# Patient Record
Sex: Male | Born: 1960 | Race: White | Hispanic: No | Marital: Married | Smoking: Never smoker
Health system: Southern US, Community
[De-identification: ages and names within clinical notes are randomized; demographics above are authoritative.]

## PROBLEM LIST (undated history)

## (undated) DIAGNOSIS — T7840XA Allergy, unspecified, initial encounter: Secondary | ICD-10-CM

## (undated) DIAGNOSIS — K5900 Constipation, unspecified: Secondary | ICD-10-CM

## (undated) DIAGNOSIS — E669 Obesity, unspecified: Secondary | ICD-10-CM

## (undated) DIAGNOSIS — K602 Anal fissure, unspecified: Secondary | ICD-10-CM

## (undated) DIAGNOSIS — Z8619 Personal history of other infectious and parasitic diseases: Secondary | ICD-10-CM

## (undated) DIAGNOSIS — K219 Gastro-esophageal reflux disease without esophagitis: Secondary | ICD-10-CM

## (undated) HISTORY — DX: Obesity, unspecified: E66.9

## (undated) HISTORY — DX: Allergy, unspecified, initial encounter: T78.40XA

## (undated) HISTORY — DX: Personal history of other infectious and parasitic diseases: Z86.19

## (undated) HISTORY — DX: Gastro-esophageal reflux disease without esophagitis: K21.9

## (undated) HISTORY — DX: Constipation, unspecified: K59.00

## (undated) HISTORY — DX: Anal fissure, unspecified: K60.2

---

## 2007-01-27 ENCOUNTER — Ambulatory Visit: Payer: Self-pay | Admitting: Gastroenterology

## 2009-03-29 ENCOUNTER — Observation Stay: Payer: Self-pay | Admitting: Specialist

## 2009-05-10 DEATH — deceased

## 2015-04-30 ENCOUNTER — Ambulatory Visit (HOSPITAL_COMMUNITY)
Admission: RE | Admit: 2015-04-30 | Discharge: 2015-04-30 | Disposition: A | Discharge status: Home / Self Care | Payer: BC Managed Care – PPO | Source: Ambulatory Visit | Attending: Cardiovascular Disease | Admitting: Cardiovascular Disease

## 2015-04-30 ENCOUNTER — Other Ambulatory Visit: Payer: Self-pay | Admitting: Sports Medicine

## 2015-04-30 DIAGNOSIS — M79604 Pain in right leg: Secondary | ICD-10-CM | POA: Insufficient documentation

## 2015-04-30 DIAGNOSIS — M7989 Other specified soft tissue disorders: Secondary | ICD-10-CM

## 2015-04-30 DIAGNOSIS — M25561 Pain in right knee: Secondary | ICD-10-CM | POA: Insufficient documentation

## 2016-11-16 ENCOUNTER — Encounter: Payer: Self-pay | Admitting: Nurse Practitioner

## 2016-11-16 NOTE — Progress Notes (Signed)
.  pcn

## 2016-11-18 ENCOUNTER — Ambulatory Visit (INDEPENDENT_AMBULATORY_CARE_PROVIDER_SITE_OTHER): Payer: Self-pay | Admitting: Nurse Practitioner

## 2016-11-18 ENCOUNTER — Encounter: Payer: Self-pay | Admitting: Nurse Practitioner

## 2016-11-18 VITALS — BP 140/88 | HR 78 | Temp 98.0°F | Resp 16 | Ht 74.0 in | Wt 279.0 lb

## 2016-11-18 DIAGNOSIS — M546 Pain in thoracic spine: Secondary | ICD-10-CM

## 2016-11-18 DIAGNOSIS — Z7689 Persons encountering health services in other specified circumstances: Secondary | ICD-10-CM | POA: Diagnosis not present

## 2016-11-18 LAB — POCT URINALYSIS DIPSTICK
Bilirubin, UA: NEGATIVE
Glucose, UA: NEGATIVE
Ketones, UA: NEGATIVE
Leukocytes, UA: NEGATIVE — AB
Nitrite, UA: NEGATIVE
Spec Grav, UA: 1.025 (ref 1.010–1.025)
Urobilinogen, UA: 0.2 E.U./dL
pH, UA: 6 (ref 5.0–8.0)

## 2016-11-18 NOTE — Progress Notes (Signed)
Subjective:    Patient ID: Randy Watson, male    DOB: 19-Apr-1961, 56 y.o.   MRN: [id number]  Randy Watson is a 56 y.o. male presenting on 11/18/2016 for Back Pain (more on Right side top onset month got worst last 3-4 days)   HPI Lower Thoracic Back Pain Back pain started 1 month ago and felt like a muscle strain and has gradually worsened over last month.  Pt is active as Journalist, newspaper at [employer] and may have injured himself, but doesn't recall any injury.  He reports significant worsening over the last 3-4 days that is preventing sleep. Pain is now described as sharp pain, sometimes radiates to lower back.  It is mostly localized to lower thoracic spine and upper lumbar region. It is also making cleaning himself after using the bathroom difficult.  Most painful movements are twisting, breathing.  He also notices pain when at rest.  Movement and exercises like mowing and weedeating for athletic fields are the extent of his activity over the last month.  The patient does not notice tenderness to touch.  Denies loss of control of bowel or bladder.  He has been taking miralax qod to help with straining r/t anal fissure.  He notes it is helping him ease bowel movement strain with back pain also.  For pain he has used ibuproven,aleeve motrin, tylenol, muscle rub with lidocaine, heat and ice with minimal relief.  Heat more effective than ice.  He reports utilizing chiropractic care 1x month, but not since pain started.    Associated symptoms: urinary frequency.   Significant to patient is his family hx kidney stone and kidney cancer.  He is interested in making sure these conditions are not likely for his current symptoms.  Family History  Problem Relation Age of Onset  . Cancer Father     renal/pancreatic  . CAD Father    Past Medical History:  Diagnosis Date  . Allergy   . Anal fissure   . Constipation   . GERD (gastroesophageal reflux disease)   . History of chicken pox    . Obesity     Social History  Substance Use Topics  . Smoking status: Never Smoker  . Smokeless tobacco: Never Used  . Alcohol use Yes     Comment: occasionally    Review of Systems  All other systems reviewed and are negative.  Per HPI unless specifically indicated above     Objective:    BP 140/88   Pulse 78   Temp 98 F (36.7 C) (Oral)   Resp 16   Ht 6\' 2"  (1.88 m)   Wt 279 lb (126.6 kg)   BMI 35.82 kg/m   Wt Readings from Last 3 Encounters:  11/18/16 279 lb (126.6 kg)    Physical Exam  Constitutional: He appears well-developed and well-nourished.  Consistently changing positions to relieve pain  HENT:  Head: Normocephalic and atraumatic.  Neck: Normal range of motion. Neck supple.  Pulmonary/Chest:  CVA tenderness Left  Musculoskeletal:       Cervical back: He exhibits no tenderness.       Thoracic back: He exhibits decreased range of motion and pain. He exhibits no tenderness and no bony tenderness.       Lumbar back: He exhibits no tenderness.       Back:  Decr ROM lateral bend to R and L.  Forward bend FROM but elicits pain.       Assessment & Plan:  Problem List Items Addressed This Visit    None    Visit Diagnoses    Encounter to establish care with new doctor    -  Primary   Acute right-sided thoracic back pain     Worsening severity, subacute.  Cause unknown - possible musculoskeletal cause vs. Other referred pain as evidenced by positive CVA tenderness.  1. Control pain with NSAIDS, acetaminophen, topical agents, heat and ice. - NSAIDS may raise BP.  Educated patient to use acetaminophen first, then ibuprofen if needed.  Avoid multiple NSAIDS in same day. 2. UA with culture. - Urinalysis positive for trace blood and protein.   - Proceed with culture.  Evaluate labs at next appointment for DM, HTN that may explain blood and protein in UA. 3. Consider abdominal ultrasound or abdominal and pelvic CT scan or MRI to evaluate spine and kidney,  ureters, bladder, urethra and referral to specialist if indicated by imaging. - Family history of renal stones and renal cancer significant.  Other risk factors for renal cell carcinoma are present (obesity, prior high BP readings in clinic with possible undiagnosed HTN, male gender).   Relevant Orders   Urine culture   POCT urinalysis dipstick (Completed)      Follow up plan: Return 1-2 weeks if symptoms worsen or fail to improve.  Cassell Smiles, DNP, AGPCNP-BC Adult Gerontology Primary Care Nurse Practitioner [facility] 11/18/2016, 10:32 PM

## 2016-11-18 NOTE — Patient Instructions (Signed)
Randy Watson, Thank you for coming in to clinic today.  1. - Start taking acetaminophen Tylenol extra strength 1 to 2 tablets every 6-8 hours for aches or fever/chills for next few days as needed.  Do not take more than 3,000 mg in 24 hours from all medicines.  May take Ibuprofen as well if tolerated 400-600 mg every 6-8 hours as needed.  Alternate between the two medicines. - Use heat and ice.  Apply this for 15 minutes at a time 6-8 times per day.   - Muscle rub with lidocaine.  Avoid using this with heat and ice to avoid burns.  2. We will do a urinalysis and culture if needed today.  If nothing, we will do an abdominal ultrasound of the Kidney.  3. If no answers or resolving symptoms after 1-2 weeks, we can do an X-ray and/ or MRI.  Please schedule a follow-up appointment with Cassell Smiles, AGNP in 1-2 weeks if not improving.  If you have any other questions or concerns, please feel free to call the clinic or send a message through [facility]. You may also schedule an earlier appointment if necessary.  Cassell Smiles, DNP, AGNP-BC Adult Gerontology Nurse Practitioner [facility]   Mid-Back Strain Rehab Ask your health care provider which exercises are safe for you. Do exercises exactly as told by your health care provider and adjust them as directed. It is normal to feel mild stretching, pulling, tightness, or discomfort as you do these exercises, but you should stop right away if you feel sudden pain or your pain gets worse. Do not begin these exercises until told by your health care provider. Stretching and range of motion exercises This exercise warms up your muscles and joints and improves the movement and flexibility of your back and shoulders. This exercise also help to relieve pain. Exercise A: Chest and spine stretch   1. Lie down on your back on a firm surface. 2. Roll a towel or a small blanket so it is about 4 inches (10 cm) in diameter. 3. Put the towel  lengthwise under the middle of your back so it is under your spine, but not under your shoulder blades. 4. To increase the stretch, you may put your hands behind your head and let your elbows fall to your sides. 5. Hold for __________ seconds. Repeat exercise __________ times. Complete this exercise __________ times a day. Strengthening exercises These exercises build strength and endurance in your back and your shoulder blade muscles. Endurance is the ability to use your muscles for a long time, even after they get tired. Exercise B: Alternating arm and leg raises   1. Get on your hands and knees on a firm surface. If you are on a hard floor, you may want to use padding to cushion your knees, such as an exercise mat. 2. Line up your arms and legs. Your hands should be below your shoulders, and your knees should be below your hips. 3. Lift your left leg behind you. At the same time, raise your right arm and straighten it in front of you.  Do not lift your leg higher than your hip.  Do not lift your arm higher than your shoulder.  Keep your abdominal and back muscles tight.  Keep your hips facing the ground.  Do not arch your back.  Keep your balance carefully, and do not hold your breath. 4. Hold for __________ seconds. 5. Slowly return to the starting position and repeat with  your right leg and your left arm. Repeat __________ times. Complete this exercise __________ times a day. Exercise C: Straight arm rows (  shoulder extension) 1. Stand with your feet shoulder width apart. 2. Secure an exercise band to a stable object in front of you so the band is at or above shoulder height. 3. Hold one end of the exercise band in each hand. 4. Straighten your elbows and lift your hands up to shoulder height. 5. Step back, away from the secured end of the exercise band, until the band stretches. 6. Squeeze your shoulder blades together and pull your hands down to the sides of your thighs. Stop  when your hands are straight down by your sides. Do not let your hands go behind your body. 7. Hold for __________ seconds. 8. Slowly return to the starting position. Repeat __________ times. Complete this exercise __________ times a day. Exercise D: Shoulder external rotation, prone 1. Lie on your abdomen on a firm bed so your left / right forearm hangs over the edge of the bed and your upper arm is on the bed, straight out from your body.  Your elbow should be bent.  Your palm should be facing your feet. 2. If instructed, hold a __________ weight in your hand. 3. Squeeze your shoulder blade toward the middle of your back. Do not let your shoulder lift toward your ear. 4. Keep your elbow bent in an "L" shape (90 degrees) while you slowly move your forearm up toward the ceiling. Move your forearm up to the height of the bed, toward your head.  Your upper arm should not move.  At the top of the movement, your palm should face the floor. 5. Hold for __________ seconds. 6. Slowly return to the starting position and relax your muscles. Repeat __________ times. Complete this exercise __________ times a day. Exercise E: Scapular retraction and external rotation, rowing  1. Sit in a stable chair without armrests, or stand. 2. Secure an exercise band to a stable object in front of you so it is at shoulder height. 3. Hold one end of the exercise band in each hand. 4. Bring your arms out straight in front of you. 5. Step back, away from the secured end of the exercise band, until the band stretches. 6. Pull the band backward. As you do this, bend your elbows and squeeze your shoulder blades together, but avoid letting the rest of your body move. Do not let your shoulders lift up toward your ears. 7. Stop when your elbows are at your sides or slightly behind your body. 8. Hold for __________ seconds. 9. Slowly straighten your arms to return to the starting position. Repeat __________ times.  Complete this exercise __________ times a day. Posture and body mechanics  Body mechanics refers to the movements and positions of your body while you do your daily activities. Posture is part of body mechanics. Good posture and healthy body mechanics can help to relieve stress in your body's tissues and joints. Good posture means that your spine is in its natural S-curve position (your spine is neutral), your shoulders are pulled back slightly, and your head is not tipped forward. The following are general guidelines for applying improved posture and body mechanics to your everyday activities. Standing   When standing, keep your spine neutral and your feet about hip-width apart. Keep a slight bend in your knees. Your ears, shoulders, and hips should line up.  When you do a task in which  you lean forward while standing in one place for a long time, place one foot up on a stable object that is 2-4 inches (5-10 cm) high, such as a footstool. This helps keep your spine neutral. Sitting   When sitting, keep your spine neutral and keep your feet flat on the floor. Use a footrest, if necessary, and keep your thighs parallel to the floor. Avoid rounding your shoulders, and avoid tilting your head forward.  When working at a desk or a computer, keep your desk at a height where your hands are slightly lower than your elbows. Slide your chair under your desk so you are close enough to maintain good posture.  When working at a computer, place your monitor at a height where you are looking straight ahead and you do not have to tilt your head forward or downward to look at the screen. Resting  When lying down and resting, avoid positions that are most painful for you.  If you have pain with activities such as sitting, bending, stooping, or squatting (flexion-based activities), lie in a position in which your body does not bend very much. For example, avoid curling up on your side with your arms and knees  near your chest (fetal position).  If you have pain with activities such as standing for a long time or reaching with your arms (extension-based activities), lie with your spine in a neutral position and bend your knees slightly. Try the following positions:  Lying on your side with a pillow between your knees.  Lying on your back with a pillow under your knees. Lifting   When lifting objects, keep your feet at least shoulder-width apart and tighten your abdominal muscles.  Bend your knees and hips and keep your spine neutral. It is important to lift using the strength of your legs, not your back. Do not lock your knees straight out.  Always ask for help to lift heavy or awkward objects. This information is not intended to replace advice given to you by your health care provider. Make sure you discuss any questions you have with your health care provider. Document Released: 07/27/2005 Document Revised: 04/02/2016 Document Reviewed: 05/08/2015 Elsevier Interactive Patient Education  2017 [employer].

## 2016-11-19 NOTE — Progress Notes (Signed)
I have reviewed this encounter including the documentation in this note and/or discussed this patient with the provider, Cassell Smiles, AGPCNP-BC. I am certifying that I agree with the content of this note as supervising physician.  [name], [facility] 11/19/2016, 8:13 AM

## 2016-11-20 LAB — URINE CULTURE: Organism ID, Bacteria: NO GROWTH

## 2016-11-23 ENCOUNTER — Other Ambulatory Visit: Payer: Self-pay | Admitting: Nurse Practitioner

## 2016-11-23 DIAGNOSIS — R808 Other proteinuria: Secondary | ICD-10-CM

## 2016-11-23 DIAGNOSIS — R3129 Other microscopic hematuria: Secondary | ICD-10-CM

## 2016-11-25 LAB — COMPLETE METABOLIC PANEL WITH GFR
ALT: 22 U/L (ref 9–46)
AST: 21 U/L (ref 10–35)
Albumin: 4.2 g/dL (ref 3.6–5.1)
Alkaline Phosphatase: 86 U/L (ref 40–115)
BUN: 13 mg/dL (ref 7–25)
CO2: 23 mmol/L (ref 20–31)
Calcium: 9.4 mg/dL (ref 8.6–10.3)
Chloride: 104 mmol/L (ref 98–110)
Creat: 0.95 mg/dL (ref 0.70–1.33)
GFR, Est African American: >89 mL/min (ref >=60)
GFR, Est Non African American: 89 mL/min (ref >=60)
Glucose, Bld: 120 mg/dL — ABNORMAL HIGH (ref 65–99)
Potassium: 4.2 mmol/L (ref 3.5–5.3)
Sodium: 138 mmol/L (ref 135–146)
Total Bilirubin: 0.6 mg/dL (ref 0.2–1.2)
Total Protein: 7.1 g/dL (ref 6.1–8.1)

## 2016-11-26 LAB — HEMOGLOBIN A1C
Hgb A1c MFr Bld: 5.7 % — ABNORMAL HIGH (ref <5.7)
Mean Plasma Glucose: 117 mg/dL

## 2016-12-14 ENCOUNTER — Encounter: Payer: Self-pay | Admitting: Nurse Practitioner

## 2016-12-15 ENCOUNTER — Other Ambulatory Visit: Payer: Self-pay | Admitting: Nurse Practitioner

## 2016-12-15 DIAGNOSIS — R109 Unspecified abdominal pain: Secondary | ICD-10-CM

## 2017-01-19 ENCOUNTER — Ambulatory Visit
Admission: RE | Admit: 2017-01-19 | Discharge: 2017-01-19 | Disposition: A | Discharge status: Home / Self Care | Payer: BC Managed Care – PPO | Source: Ambulatory Visit | Attending: Urology | Admitting: Urology

## 2017-01-19 ENCOUNTER — Ambulatory Visit (INDEPENDENT_AMBULATORY_CARE_PROVIDER_SITE_OTHER): Payer: BC Managed Care – PPO | Admitting: Urology

## 2017-01-19 ENCOUNTER — Encounter: Payer: Self-pay | Admitting: Urology

## 2017-01-19 VITALS — BP 164/92 | HR 66 | Ht 74.0 in | Wt 277.2 lb

## 2017-01-19 DIAGNOSIS — R109 Unspecified abdominal pain: Secondary | ICD-10-CM | POA: Diagnosis not present

## 2017-01-19 DIAGNOSIS — Z125 Encounter for screening for malignant neoplasm of prostate: Secondary | ICD-10-CM | POA: Diagnosis not present

## 2017-01-19 LAB — URINALYSIS, COMPLETE
Bilirubin, UA: NEGATIVE
Glucose, UA: NEGATIVE
Ketones, UA: NEGATIVE
Leukocytes, UA: NEGATIVE
Nitrite, UA: NEGATIVE
Protein, UA: NEGATIVE
RBC, UA: NEGATIVE
Specific Gravity, UA: 1.02 (ref 1.005–1.030)
Urobilinogen, Ur: 0.2 mg/dL (ref 0.2–1.0)
pH, UA: 6.5 (ref 5.0–7.5)

## 2017-01-19 NOTE — Progress Notes (Signed)
01/19/2017 10:54 AM   Randy Watson Jun 19, 1961 [id number]  Referring provider: Arlis Porta., MD No address on file  Chief Complaint  Patient presents with  . New Patient (Initial Visit)    R  flank pain referred by Mattie Marlin    HPI: Consultation for right flank pain. For about 2 months patient has had right upper back pain which sometimes radiates to front. It's worse when he twists or bends. He went to a Art therapist. NSAIDs help. Discomfort stable. Mild to moderate in intensity - "bearable". UA showed trace blood at PCP. No microscopic exam was done. BUN 13, creatinine 0.95. He had no imaging done. He has a family history of kidney stones and kidney cancer, therefore was referred to urology. His urine is clear. No dysuria or gross hematuria. AUA symptom score is 3, quality of life 2.   UA today shows no red blood cells, no white blood cells, no bacteria. Bland.    PMH: Past Medical History:  Diagnosis Date  . Allergy   . Anal fissure   . Constipation   . GERD (gastroesophageal reflux disease)   . History of chicken pox   . Obesity     Surgical History: No past surgical history on file.  Home Medications:  Allergies as of 01/19/2017      Reactions   Penicillins       Medication List       Accurate as of 01/19/17 10:54 AM. Always use your most recent med list.          ibuprofen 200 MG tablet Commonly known as:  ADVIL,MOTRIN Take 200 mg by mouth every 6 (six) hours as needed.   polyethylene glycol powder powder Commonly known as:  GLYCOLAX/MIRALAX Take 1 Container by mouth once.       Allergies:  Allergies  Allergen Reactions  . Penicillins     Family History: Family History  Problem Relation Age of Onset  . Cancer Father        renal/pancreatic  . CAD Father   . Prostate cancer Maternal Grandfather   . Bladder Cancer Neg Hx     Social History:  reports that he has never smoked. His smokeless tobacco use includes Chew. He  reports that he drinks alcohol. He reports that he does not use drugs.  ROS: UROLOGY Frequent Urination?: No Hard to postpone urination?: No Burning/pain with urination?: No Get up at night to urinate?: Yes Leakage of urine?: No Urine stream starts and stops?: No Trouble starting stream?: No Do you have to strain to urinate?: No Blood in urine?: Yes Urinary tract infection?: No Sexually transmitted disease?: No Injury to kidneys or bladder?: No Painful intercourse?: No Weak stream?: No Erection problems?: No Penile pain?: No  Gastrointestinal Nausea?: No Vomiting?: No Indigestion/heartburn?: No Diarrhea?: No Constipation?: No  Constitutional Fever: No Night sweats?: No Weight loss?: No Fatigue?: No  Skin Skin rash/lesions?: Yes Itching?: Yes  Eyes Blurred vision?: No Double vision?: No  Ears/Nose/Throat Sore throat?: No Sinus problems?: No  Hematologic/Lymphatic Swollen glands?: No Easy bruising?: No  Cardiovascular Leg swelling?: No Chest pain?: No  Respiratory Cough?: No Shortness of breath?: No  Endocrine Excessive thirst?: No  Musculoskeletal Back pain?: Yes Joint pain?: Yes  Neurological Headaches?: No Dizziness?: No  Psychologic Depression?: No Anxiety?: No  Physical Exam: BP (!) 164/92   Pulse 66   Ht 6\' 2"  (1.88 m)   Wt 125.7 kg (277 lb 3.2 oz)   BMI 35.59  kg/m   Constitutional:  Alert and oriented, No acute distress. HEENT: [place] AT, moist mucus membranes.  Trachea midline, no masses. Cardiovascular: No clubbing, cyanosis, or edema. Respiratory: Normal respiratory effort, no increased work of breathing. GI: Abdomen is soft, nontender, nondistended, no abdominal masses GU: No CVA tenderness.  Penis - circumcised and normal Testicles - descended bilaterally and palpably normal DRE - prostate about 30 g , smooth , no hard area of nodule   Skin: No rashes, bruises or suspicious lesions. Lymph: No cervical or inguinal  adenopathy. Neurologic: Grossly intact, no focal deficits, moving all 4 extremities. Psychiatric: Normal mood and affect.  Laboratory Data: No results found for: WBC, HGB, HCT, MCV, PLT  Lab Results  Component Value Date   CREATININE 0.95 11/23/2016    No results found for: PSA  No results found for: TESTOSTERONE  Lab Results  Component Value Date   HGBA1C 5.7 (H) 11/23/2016    Urinalysis    Component Value Date/Time   BILIRUBINUR negative 11/18/2016 1149   PROTEINUR trace 11/18/2016 1149   UROBILINOGEN 0.2 11/18/2016 1149   NITRITE negative 11/18/2016 1149   LEUKOCYTESUR Negative (A) 11/18/2016 1149    Assessment & Plan:   1. Left flank pain - Unclear etiology. Normal exam and urine clear. Discussed with patient the differential diagnosis. Discussed urinary tract screening and the nature risk benefits positive and negative predictive values of KUB, renal ultrasound and CT scan with and without contrast. All questions answered. He elected to go with a KUB and a renal ultrasound.  2. PCa screening - DRE felt benign. Discussed the nature risk and benefits of PSA screening and he elected to check a PSA.  - Urinalysis, Complete   No Follow-up on file.  Festus Aloe, [facility] [address], [place], [place] [address] [phone]

## 2017-01-20 LAB — PSA: Prostate Specific Ag, Serum: 0.6 ng/mL (ref 0.0–4.0)

## 2017-01-25 ENCOUNTER — Ambulatory Visit
Admission: RE | Admit: 2017-01-25 | Discharge: 2017-01-25 | Disposition: A | Discharge status: Home / Self Care | Payer: BC Managed Care – PPO | Source: Ambulatory Visit | Attending: Urology | Admitting: Urology

## 2017-01-25 DIAGNOSIS — R109 Unspecified abdominal pain: Secondary | ICD-10-CM

## 2017-01-26 ENCOUNTER — Telehealth: Payer: Self-pay

## 2017-01-26 NOTE — Telephone Encounter (Signed)
Patient notified of results on vmail

## 2017-01-26 NOTE — Telephone Encounter (Signed)
-----   Message from Festus Aloe, MD sent at 01/26/2017 10:38 AM EDT ----- Notify patient his PSA is very low, very normal.    ----- Message ----- From: Royanne Foots, CMA Sent: 01/20/2017   8:34 AM To: Festus Aloe, MD    ----- Message ----- From: Interface, [facility] Lab Results In Sent: 01/19/2017   4:41 PM To: Rowe Robert Clinical

## 2017-01-26 NOTE — Telephone Encounter (Signed)
-----   Message from Festus Aloe, MD sent at 01/25/2017 10:28 PM EDT ----- Notify patient his KUB was also normal - no stone identified   ----- Message ----- From: Royanne Foots, CMA Sent: 01/19/2017   3:51 PM To: Festus Aloe, MD    ----- Message ----- From: Interface, Rad Results In Sent: 01/19/2017   3:12 PM To: Rowe Robert Clinical

## 2017-01-26 NOTE — Telephone Encounter (Signed)
-----   Message from Festus Aloe, MD sent at 01/25/2017 10:22 PM EDT ----- Notify patient his renal US was normal (no mass, stone or blockage was noted)- I recommend he f/u with his PCP for continued evaluation of pain  ----- Message ----- From: Royanne Foots, CMA Sent: 01/25/2017   4:43 PM To: Festus Aloe, MD    ----- Message ----- From: Interface, Rad Results In Sent: 01/25/2017   4:35 PM To: Rowe Robert Clinical

## 2017-01-26 NOTE — Telephone Encounter (Signed)
Left pt mess to call 

## 2018-10-13 ENCOUNTER — Encounter: Payer: Self-pay | Admitting: Emergency Medicine

## 2018-10-13 ENCOUNTER — Emergency Department: Payer: BC Managed Care – PPO

## 2018-10-13 ENCOUNTER — Other Ambulatory Visit: Payer: Self-pay

## 2018-10-13 ENCOUNTER — Emergency Department
Admission: EM | Admit: 2018-10-13 | Discharge: 2018-10-14 | Disposition: A | Discharge status: Home / Self Care | Payer: BC Managed Care – PPO | Attending: Emergency Medicine | Admitting: Emergency Medicine

## 2018-10-13 DIAGNOSIS — F17220 Nicotine dependence, chewing tobacco, uncomplicated: Secondary | ICD-10-CM | POA: Insufficient documentation

## 2018-10-13 DIAGNOSIS — Z79899 Other long term (current) drug therapy: Secondary | ICD-10-CM | POA: Insufficient documentation

## 2018-10-13 DIAGNOSIS — R079 Chest pain, unspecified: Secondary | ICD-10-CM | POA: Diagnosis present

## 2018-10-13 LAB — BASIC METABOLIC PANEL
Anion gap: 7 (ref 5–15)
BUN: 15 mg/dL (ref 6–20)
CO2: 27 mmol/L (ref 22–32)
Calcium: 8.9 mg/dL (ref 8.9–10.3)
Chloride: 105 mmol/L (ref 98–111)
Creatinine, Ser: 0.91 mg/dL (ref 0.61–1.24)
GFR calc Af Amer: >60 mL/min (ref >60)
GFR calc non Af Amer: >60 mL/min (ref >60)
Glucose, Bld: 94 mg/dL (ref 70–99)
Potassium: 3.8 mmol/L (ref 3.5–5.1)
Sodium: 139 mmol/L (ref 135–145)

## 2018-10-13 LAB — CBC
HCT: 44.5 % (ref 39.0–52.0)
Hemoglobin: 14.8 g/dL (ref 13.0–17.0)
MCH: 30.7 pg (ref 26.0–34.0)
MCHC: 33.3 g/dL (ref 30.0–36.0)
MCV: 92.3 fL (ref 80.0–100.0)
Platelets: 330 10*3/uL (ref 150–400)
RBC: 4.82 MIL/uL (ref 4.22–5.81)
RDW: 12.1 % (ref 11.5–15.5)
WBC: 9.5 10*3/uL (ref 4.0–10.5)
nRBC: 0 % (ref 0.0–0.2)

## 2018-10-13 LAB — TROPONIN I
Troponin I: <0.03 ng/mL (ref <0.03)
Troponin I: <0.03 ng/mL (ref <0.03)

## 2018-10-13 NOTE — ED Notes (Signed)
Dr. Brown at bedside

## 2018-10-13 NOTE — ED Triage Notes (Signed)
Pt arrives with complaints of left sided chest pain that started last night. Pt reports the pain has been intermittent. Pt states the pain feels like a pressure.

## 2018-10-13 NOTE — ED Provider Notes (Signed)
[facility] Emergency Department Provider Note    First MD Initiated Contact with Patient 10/13/18 2302     (approximate)  I have reviewed the triage vital signs and the nursing notes.   HISTORY  Chief Complaint Chest Pain   HPI Randy Watson is a 58 y.o. presents to the emergency department intermittent left-sided chest pain which began yesterday.  Patient denies any dyspnea no lower extremity pain or swelling.  Patient denies any abdominal discomfort.  Patient denies any dizziness or palpitations.  Patient does admit to familial history of CAD father receiving stents.  Patient denies any pain at present.    Past Medical History:  Diagnosis Date  . Allergy   . Anal fissure   . Constipation   . GERD (gastroesophageal reflux disease)   . History of chicken pox   . Obesity     There are no active problems to display for this patient.   History reviewed. No pertinent surgical history.  Prior to Admission medications   Medication Sig Start Date End Date Taking? Authorizing Provider  ibuprofen (ADVIL,MOTRIN) 200 MG tablet Take 200 mg by mouth every 6 (six) hours as needed.    [provider]  polyethylene glycol powder (GLYCOLAX/MIRALAX) powder Take 1 Container by mouth once.    [provider]    Allergies Penicillins  Family History  Problem Relation Age of Onset  . Cancer Father        renal/pancreatic  . CAD Father   . Prostate cancer Maternal Grandfather   . Bladder Cancer Neg Hx     Social History Social History   Tobacco Use  . Smoking status: Never Smoker  . Smokeless tobacco: Current User    Types: Chew  . Tobacco comment: dip  Substance Use Topics  . Alcohol use: Yes    Comment: occasionally  . Drug use: No    Review of Systems Constitutional: No fever/chills Eyes: No visual changes. ENT: No sore throat. Cardiovascular: Positive for chest pain. Respiratory: Denies shortness of  breath. Gastrointestinal: No abdominal pain.  No nausea, no vomiting.  No diarrhea.  No constipation. Genitourinary: Negative for dysuria. Musculoskeletal: Negative for neck pain.  Negative for back pain. Integumentary: Negative for rash. Neurological: Negative for headaches, focal weakness or numbness.   ____________________________________________   PHYSICAL EXAM:  VITAL SIGNS: ED Triage Vitals  Enc Vitals Group     BP 10/13/18 1904 (!) 147/92     Pulse Rate 10/13/18 1903 81     Resp 10/13/18 1903 18     Temp 10/13/18 1903 98.2 F (36.8 C)     Temp Source 10/13/18 1903 Oral     SpO2 10/13/18 1903 98 %     Weight 10/13/18 1904 119.3 kg (263 lb)     Height 10/13/18 1904 1.88 m (6\' 2" )     Head Circumference --      Peak Flow --      Pain Score 10/13/18 1903 5     Pain Loc --      Pain Edu? --      Excl. in [place]? --     Constitutional: Alert and oriented. Well appearing and in no acute distress. Eyes: Conjunctivae are normal.  Head: Atraumatic. Mouth/Throat: Mucous membranes are moist. Oropharynx non-erythematous. Neck: No stridor. Cardiovascular: Normal rate, regular rhythm. Good peripheral circulation. Grossly normal heart sounds. Respiratory: Normal respiratory effort.  No retractions. Lungs CTAB. Gastrointestinal: Soft and nontender. No distention.  Musculoskeletal: No lower  extremity tenderness nor edema. No gross deformities of extremities. Neurologic:  Normal speech and language. No gross focal neurologic deficits are appreciated.  Skin:  Skin is warm, dry and intact. No rash noted. Psychiatric: Mood and affect are normal. Speech and behavior are normal.  ____________________________________________   LABS (all labs ordered are listed, but only abnormal results are displayed)  Labs Reviewed  BASIC METABOLIC PANEL  CBC  TROPONIN I  TROPONIN I   ____________________________________________  EKG  ED ECG REPORT I, [place] N Nathifa Ritthaler, the attending  physician, personally viewed and interpreted this ECG.   Date: 10/13/2018  EKG Time: 7:03 PM  Rate: 79  Rhythm: Normal sinus rhythm  Axis: Normal  Intervals: Normal  ST&T Change: None  ____________________________________________  RADIOLOGY I, Helen N Kayveon Lennartz, personally viewed and evaluated these images (plain radiographs) as part of my medical decision making, as well as reviewing the written report by the radiologist.  ED MD interpretation: No active cardiopulmonary disease noted on chest x-ray per radiologist.  Official radiology report(s): Dg Chest 2 View  Result Date: 10/13/2018 CLINICAL DATA:  Chest pain EXAM: CHEST - 2 VIEW COMPARISON:  None. FINDINGS: The heart size and mediastinal contours are within normal limits. Both lungs are clear. Mild degenerative changes of the spine. Mild atelectasis at the left base. IMPRESSION: No active cardiopulmonary disease. Electronically Signed   By: Donavan Foil M.D.   On: 10/13/2018 19:21     Procedures   ____________________________________________   INITIAL IMPRESSION / MDM / [place] / ED COURSE  As part of my medical decision making, I reviewed the following data within the electronic MEDICAL RECORD NUMBER 58 year old male presenting with above-stated history and physical exam secondary to chest pain however none at present.  Concern for possible CAD including MI/angina however EKG revealed no evidence of ischemia or infarction.  Laboratory data including troponin x2- additional laboratory data also unremarkable.  Chest x-ray also negative.  Spoke with the patient at length regarding necessity of following up with Dr. Saralyn Pilar cardiologist for further outpatient evaluation. ____________________________________________  FINAL CLINICAL IMPRESSION(S) / ED DIAGNOSES  Final diagnoses:  Chest pain, unspecified type     MEDICATIONS GIVEN DURING THIS VISIT:  Medications - No data to display   ED Discharge Orders     None       Note:  This document was prepared using Dragon voice recognition software and may include unintentional dictation errors.   Gregor Hams, MD 10/14/18 [phone]

## 2018-10-13 NOTE — ED Notes (Addendum)
Pt refuses lab draw and demands to speak to MD. MD made aware

## 2018-10-14 NOTE — ED Notes (Signed)
Patient discharged to home per MD order. Patient in stable condition, and deemed medically cleared by ED provider for discharge. Discharge instructions reviewed with patient/family using "Teach Back"; verbalized understanding of medication education and administration, and information about follow-up care. Denies further concerns. ° °

## 2018-11-30 DIAGNOSIS — E782 Mixed hyperlipidemia: Secondary | ICD-10-CM | POA: Insufficient documentation

## 2018-11-30 DIAGNOSIS — I10 Essential (primary) hypertension: Secondary | ICD-10-CM | POA: Insufficient documentation

## 2018-12-09 DIAGNOSIS — I639 Cerebral infarction, unspecified: Secondary | ICD-10-CM

## 2018-12-09 HISTORY — DX: Cerebral infarction, unspecified: I63.9

## 2018-12-19 ENCOUNTER — Other Ambulatory Visit: Payer: Self-pay

## 2018-12-19 ENCOUNTER — Ambulatory Visit (INDEPENDENT_AMBULATORY_CARE_PROVIDER_SITE_OTHER): Payer: BC Managed Care – PPO | Admitting: Family Medicine

## 2018-12-19 ENCOUNTER — Encounter: Payer: Self-pay | Admitting: Family Medicine

## 2018-12-19 DIAGNOSIS — R109 Unspecified abdominal pain: Secondary | ICD-10-CM

## 2018-12-19 DIAGNOSIS — R350 Frequency of micturition: Secondary | ICD-10-CM

## 2018-12-19 LAB — POCT URINALYSIS DIPSTICK
Bilirubin, UA: NEGATIVE
Glucose, UA: NEGATIVE
Ketones, UA: NEGATIVE
Leukocytes, UA: NEGATIVE
Nitrite, UA: NEGATIVE
Protein, UA: NEGATIVE
Spec Grav, UA: 1.01 (ref 1.010–1.025)
Urobilinogen, UA: 0.2 E.U./dL
pH, UA: 5 (ref 5.0–8.0)

## 2018-12-19 NOTE — Patient Instructions (Addendum)
Most likely muscular back pain or side pain, but possible to still be urinary issue  Stay tuned 48-72 hours for urine culture result, we send out to lab, if show bacteria or infection we will call you or notify you on mychart with results regardless and we can offer antibiotic if infection - Also you can let us know IF you need a rx strength Naproxen  IF NEEDED I would recommend trial of Anti-inflammatory with Naproxen (Naprosyn) 500mg  tabs - take one with food and plenty of water TWICE daily every day (breakfast and dinner), for next 1 to 2 weeks, then you may take only as needed - DO NOT TAKE any ibuprofen, aleve, motrin while you are taking this medicine - It is safe to take Tylenol Ext Str 500mg  tabs - take 1 to 2 (max dose 1000mg ) every 6 hours as needed for breakthrough pain, max 24 hour daily dose is 6 to 8 tablets or 4000mg   If needed you can RETURN to your Urologist - [facility] - Dr Janifer Adie - last seen 2018.  Please schedule a Follow-up Appointment to: Return in about 1 week (around 12/26/2018), or if symptoms worsen or fail to improve, for UTI vs MSK Back Pain.  If you have any other questions or concerns, please feel free to call the office or send a message through [place]. You may also schedule an earlier appointment if necessary.  Additionally, you may be receiving a survey about your experience at our office within a few days to 1 week by e-mail or mail. We value your feedback.  Randy Putnam, DO [place]

## 2018-12-19 NOTE — Progress Notes (Signed)
Virtual Visit via Telephone The purpose of this virtual visit is to provide medical care while limiting exposure to the novel coronavirus (COVID19) for both patient and office staff.  Consent was obtained for phone visit:  Yes.   Answered questions that patient had about telehealth interaction:  Yes.   I discussed the limitations, risks, security and privacy concerns of performing an evaluation and management service by telephone. I also discussed with the patient that there may be a patient responsible charge related to this service. The patient expressed understanding and agreed to proceed.  Patient Location: Home Provider Location: [facility] (Office)  PCP is Cassell Smiles, AGPCNP-BC - I am currently covering during her maternity leave.   ---------------------------------------------------------------------- Chief Complaint  Patient presents with  . Left Flank pain  . Urinary Frequency    S: Reviewed CMA documentation. I have called patient and gathered additional HPI as follows:  URINARY FREQUENCY / LEFT FLANK vs LOW BACK PAIN Reports that symptoms started about 3 days ago with urinary frequency and Left side of flank/back pain episodic, had some slightly darker urine which has improved. - Today feels better slightly than it did yesterday. Describes persistent pain, does not seem episodic, worse if he hits a "bump" while riding mower. - Additionally he has followed with Chiropractor, he had apt last week but was re-scheduled he can go tomorrow if need - Tried OTC Tylenol PRN. Takes Ibuprofen PRN none recently. - He has had prior UTI by his report has been years ago. Last time urinalysis and urine culture done in 2018 was negative. - He has been followed by [facility] Dr Alger Simons in past 2018 - for evaluation of similar problem, he has a fam history father with bladder cancer, and they did work-up at that time including imaging [place] Denies any burning urinary or  dysuria, nausea vomiting  Denies any high risk travel to areas of current concern for [place]. Denies any known or suspected exposure to person with or possibly with COVID19.  Denies any fevers, chills, sweats, body ache, cough, shortness of breath, sinus pain or pressure, headache, abdominal pain, diarrhea  Past Medical History:  Diagnosis Date  . Allergy   . Anal fissure   . Constipation   . GERD (gastroesophageal reflux disease)   . History of chicken pox   . Obesity    Social History   Tobacco Use  . Smoking status: Never Smoker  . Smokeless tobacco: Current User    Types: Chew  . Tobacco comment: dip  Substance Use Topics  . Alcohol use: Yes    Comment: occasionally  . Drug use: No    Current Outpatient Medications:  .  ibuprofen (ADVIL,MOTRIN) 200 MG tablet, Take 200 mg by mouth every 6 (six) hours as needed., Disp: , Rfl:   No flowsheet data found.  No flowsheet data found.  -------------------------------------------------------------------------- O: No physical exam performed due to remote telephone encounter.  Lab results reviewed.   Results for orders placed or performed in visit on 12/19/18 (from the past 24 hour(s))  POCT urinalysis dipstick     Status: Abnormal   Collection Time: 12/19/18  2:14 PM  Result Value Ref Range   Color, UA amber    Clarity, UA clear    Glucose, UA Negative Negative   Bilirubin, UA Negative    Ketones, UA Negative    Spec Grav, UA 1.010 1.010 - 1.025   Blood, UA trace    pH, UA 5.0 5.0 -  8.0   Protein, UA Negative Negative   Urobilinogen, UA 0.2 0.2 or 1.0 E.U./dL   Nitrite, UA Negative    Leukocytes, UA Negative Negative   Appearance clear    Odor      -------------------------------------------------------------------------- A&P:  Problem List Items Addressed This Visit    None    Visit Diagnoses    Left flank pain    -  Primary   Relevant Orders   POCT urinalysis dipstick (Completed)   Urine Culture    Urinary frequency       Relevant Orders   POCT urinalysis dipstick (Completed)   Urine Culture     Clinically suspicious for possible UTI but limited data to support, rare prior episode of UTI, limited risk factors Prior work-up Urology [facility] 2018, unremarkable, has fam history bladder cancer. He is a never smoker. Assoc flank pain is questionable for UTI vs Nephrolithiasis vs MSK Back etiology seems most common actually - due to positional nature of it and how it is provoked No concern for pyelo today (no systemic symptoms, neg fever, back pain, n/v).  Plan: 1. Patient came to office prior to his virtual visit by phone today and he left a urine sample, then he left the office before his phone appointment - Reviewed UA dipstick, trace blood only, inconclusive 2. Ordered Urine culture 3. No empiric antibiotic at this time - offered we may add this if confirm UTI on Urine Culture 4. Improve PO hydration 5. Continue Tylenol PRN - offered that he can now increase to add NSAID Ibuprofen vs Aleve OTC as well, trial for 48 hours  If not improving or worsening pain, he will request rx Naproxen if need when he receive urine culture results in 48 hours, approx - likely 500 BID 1-2 weeks then PRN - Also offered muscle relaxant - he will also schedule w/ chiropractor soon  Advised return criteria when to return to [facility] if any new concerning urinary symptoms, including gross hematuria or worsening pain   No orders of the defined types were placed in this encounter.   Follow-up: - Return within 1 week as needed for UTI Flank Pain  Patient verbalizes understanding with the above medical recommendations including the limitation of remote medical advice.  Specific follow-up and call-back criteria were given for patient to follow-up or seek medical care more urgently if needed.   - Time spent in direct consultation with patient on phone: 11 minutes   [name], [facility] 12/19/2018, 1:23 PM

## 2018-12-20 LAB — URINE CULTURE
MICRO NUMBER:: 462487
SPECIMEN QUALITY:: ADEQUATE

## 2018-12-26 ENCOUNTER — Telehealth: Payer: Self-pay

## 2018-12-26 DIAGNOSIS — R109 Unspecified abdominal pain: Secondary | ICD-10-CM

## 2018-12-26 MED ORDER — CYCLOBENZAPRINE HCL 10 MG PO TABS: 5.0000 mg | ORAL_TABLET | Freq: Three times a day (TID) | ORAL | 0 refills | Status: DC | PRN

## 2018-12-26 NOTE — Addendum Note (Signed)
Addended by: Olin Hauser on: 12/26/2018 06:40 PM   Modules accepted: Orders

## 2018-12-26 NOTE — Telephone Encounter (Signed)
Sent rx Flexeril PRN use half or whole pill as needed for back or flank pain as muscle relaxant.  Follow-up as advised if not improving.  [name], DO [facility] 12/26/2018, 6:40 PM

## 2018-12-26 NOTE — Telephone Encounter (Signed)
[facility] pt advised for urine culture result has no Sx's but back is still hurting asking for muscle relaxer.

## 2018-12-27 NOTE — Telephone Encounter (Signed)
Patient advised.

## 2019-01-02 ENCOUNTER — Emergency Department: Payer: BC Managed Care – PPO

## 2019-01-02 ENCOUNTER — Inpatient Hospital Stay
Admission: EM | Admit: 2019-01-02 | Discharge: 2019-01-03 | DRG: 066 | Disposition: A | Discharge status: Home / Self Care | Payer: BC Managed Care – PPO | Attending: Internal Medicine | Admitting: Internal Medicine

## 2019-01-02 ENCOUNTER — Encounter: Payer: Self-pay | Admitting: Emergency Medicine

## 2019-01-02 ENCOUNTER — Other Ambulatory Visit: Payer: Self-pay

## 2019-01-02 DIAGNOSIS — H8110 Benign paroxysmal vertigo, unspecified ear: Secondary | ICD-10-CM | POA: Diagnosis present

## 2019-01-02 DIAGNOSIS — Z1159 Encounter for screening for other viral diseases: Secondary | ICD-10-CM

## 2019-01-02 DIAGNOSIS — I639 Cerebral infarction, unspecified: Secondary | ICD-10-CM

## 2019-01-02 DIAGNOSIS — I63212 Cerebral infarction due to unspecified occlusion or stenosis of left vertebral arteries: Secondary | ICD-10-CM | POA: Diagnosis not present

## 2019-01-02 DIAGNOSIS — Z880 Allergy status to penicillin: Secondary | ICD-10-CM

## 2019-01-02 DIAGNOSIS — Z8249 Family history of ischemic heart disease and other diseases of the circulatory system: Secondary | ICD-10-CM | POA: Diagnosis not present

## 2019-01-02 DIAGNOSIS — I63332 Cerebral infarction due to thrombosis of left posterior cerebral artery: Secondary | ICD-10-CM | POA: Diagnosis not present

## 2019-01-02 DIAGNOSIS — K219 Gastro-esophageal reflux disease without esophagitis: Secondary | ICD-10-CM | POA: Diagnosis present

## 2019-01-02 DIAGNOSIS — G8929 Other chronic pain: Secondary | ICD-10-CM | POA: Diagnosis present

## 2019-01-02 DIAGNOSIS — F17220 Nicotine dependence, chewing tobacco, uncomplicated: Secondary | ICD-10-CM | POA: Diagnosis present

## 2019-01-02 DIAGNOSIS — E785 Hyperlipidemia, unspecified: Secondary | ICD-10-CM | POA: Diagnosis present

## 2019-01-02 DIAGNOSIS — R278 Other lack of coordination: Secondary | ICD-10-CM | POA: Diagnosis present

## 2019-01-02 DIAGNOSIS — R42 Dizziness and giddiness: Secondary | ICD-10-CM | POA: Diagnosis present

## 2019-01-02 DIAGNOSIS — I444 Left anterior fascicular block: Secondary | ICD-10-CM | POA: Diagnosis present

## 2019-01-02 DIAGNOSIS — M549 Dorsalgia, unspecified: Secondary | ICD-10-CM | POA: Diagnosis present

## 2019-01-02 DIAGNOSIS — R29701 NIHSS score 1: Secondary | ICD-10-CM | POA: Diagnosis present

## 2019-01-02 LAB — CBC
HCT: 46.2 % (ref 39.0–52.0)
Hemoglobin: 16.1 g/dL (ref 13.0–17.0)
MCH: 31.4 pg (ref 26.0–34.0)
MCHC: 34.8 g/dL (ref 30.0–36.0)
MCV: 90.1 fL (ref 80.0–100.0)
Platelets: 289 10*3/uL (ref 150–400)
RBC: 5.13 MIL/uL (ref 4.22–5.81)
RDW: 12 % (ref 11.5–15.5)
WBC: 12.8 10*3/uL — ABNORMAL HIGH (ref 4.0–10.5)
nRBC: 0 % (ref 0.0–0.2)

## 2019-01-02 LAB — COMPREHENSIVE METABOLIC PANEL
ALT: 27 U/L (ref 0–44)
AST: 23 U/L (ref 15–41)
Albumin: 4.2 g/dL (ref 3.5–5.0)
Alkaline Phosphatase: 99 U/L (ref 38–126)
Anion gap: 11 (ref 5–15)
BUN: 15 mg/dL (ref 6–20)
CO2: 25 mmol/L (ref 22–32)
Calcium: 9.2 mg/dL (ref 8.9–10.3)
Chloride: 99 mmol/L (ref 98–111)
Creatinine, Ser: 0.76 mg/dL (ref 0.61–1.24)
GFR calc Af Amer: >60 mL/min (ref >60)
GFR calc non Af Amer: >60 mL/min (ref >60)
Glucose, Bld: 177 mg/dL — ABNORMAL HIGH (ref 70–99)
Potassium: 4.1 mmol/L (ref 3.5–5.1)
Sodium: 135 mmol/L (ref 135–145)
Total Bilirubin: 0.8 mg/dL (ref 0.3–1.2)
Total Protein: 7.9 g/dL (ref 6.5–8.1)

## 2019-01-02 LAB — APTT: aPTT: 27 seconds (ref 24–36)

## 2019-01-02 LAB — TROPONIN I: Troponin I: <0.03 ng/mL (ref <0.03)

## 2019-01-02 LAB — LIPID PANEL
Cholesterol: 224 mg/dL — ABNORMAL HIGH (ref 0–200)
HDL: 67 mg/dL (ref >40)
LDL Cholesterol: 147 mg/dL — ABNORMAL HIGH (ref 0–99)
Total CHOL/HDL Ratio: 3.3 RATIO
Triglycerides: 50 mg/dL (ref <150)
VLDL: 10 mg/dL (ref 0–40)

## 2019-01-02 LAB — LIPASE, BLOOD: Lipase: 22 U/L (ref 11–51)

## 2019-01-02 LAB — PROTIME-INR
INR: 1 (ref 0.8–1.2)
Prothrombin Time: 12.6 seconds (ref 11.4–15.2)

## 2019-01-02 LAB — SARS CORONAVIRUS 2 BY RT PCR (HOSPITAL ORDER, PERFORMED IN ~~LOC~~ HOSPITAL LAB): SARS Coronavirus 2: NEGATIVE

## 2019-01-02 MED ORDER — ASPIRIN 81 MG PO CHEW
324.0000 mg | CHEWABLE_TABLET | Freq: Once | ORAL | Status: AC
  Administered 2019-01-02: 324 mg via ORAL
  Filled 2019-01-02: qty 4

## 2019-01-02 MED ORDER — SENNOSIDES-DOCUSATE SODIUM 8.6-50 MG PO TABS: 1.0000 | ORAL_TABLET | Freq: Every evening | ORAL | Status: DC | PRN

## 2019-01-02 MED ORDER — ASPIRIN 300 MG RE SUPP: 300.0000 mg | Freq: Every day | RECTAL | Status: DC

## 2019-01-02 MED ORDER — ACETAMINOPHEN 650 MG RE SUPP: 650.0000 mg | RECTAL | Status: DC | PRN

## 2019-01-02 MED ORDER — ONDANSETRON HCL 4 MG/2ML IJ SOLN
4.0000 mg | Freq: Once | INTRAMUSCULAR | Status: AC
  Administered 2019-01-02: 4 mg via INTRAVENOUS
  Filled 2019-01-02: qty 2

## 2019-01-02 MED ORDER — MECLIZINE HCL 25 MG PO TABS
25.0000 mg | ORAL_TABLET | Freq: Three times a day (TID) | ORAL | Status: DC | PRN
  Administered 2019-01-02: 25 mg via ORAL
  Filled 2019-01-02 (×3): qty 1

## 2019-01-02 MED ORDER — MECLIZINE HCL 25 MG PO TABS
25.0000 mg | ORAL_TABLET | Freq: Once | ORAL | Status: AC
  Administered 2019-01-02: 25 mg via ORAL
  Filled 2019-01-02: qty 1

## 2019-01-02 MED ORDER — METOCLOPRAMIDE HCL 5 MG/ML IJ SOLN
10.0000 mg | Freq: Four times a day (QID) | INTRAMUSCULAR | Status: DC | PRN
  Administered 2019-01-02 (×2): 10 mg via INTRAVENOUS
  Filled 2019-01-02 (×2): qty 2

## 2019-01-02 MED ORDER — STROKE: EARLY STAGES OF RECOVERY BOOK
Freq: Once | Status: AC
  Administered 2019-01-02: 15:00:00

## 2019-01-02 MED ORDER — ACETAMINOPHEN 160 MG/5ML PO SOLN
650.0000 mg | ORAL | Status: DC | PRN
  Filled 2019-01-02: qty 20.3

## 2019-01-02 MED ORDER — IOHEXOL 350 MG/ML SOLN
75.0000 mL | Freq: Once | INTRAVENOUS | Status: AC | PRN
  Administered 2019-01-02: 75 mL via INTRAVENOUS

## 2019-01-02 MED ORDER — LORAZEPAM 2 MG/ML IJ SOLN
1.0000 mg | Freq: Once | INTRAMUSCULAR | Status: AC
  Administered 2019-01-02: 21:00:00 1 mg via INTRAVENOUS
  Filled 2019-01-02: qty 1

## 2019-01-02 MED ORDER — ACETAMINOPHEN 325 MG PO TABS: 650.0000 mg | ORAL_TABLET | ORAL | Status: DC | PRN

## 2019-01-02 MED ORDER — ENOXAPARIN SODIUM 40 MG/0.4ML ~~LOC~~ SOLN: 40.0000 mg | Freq: Two times a day (BID) | SUBCUTANEOUS | Status: DC

## 2019-01-02 MED ORDER — ASPIRIN 325 MG PO TABS
325.0000 mg | ORAL_TABLET | Freq: Every day | ORAL | Status: DC
  Administered 2019-01-03: 11:00:00 325 mg via ORAL
  Filled 2019-01-02 (×2): qty 1

## 2019-01-02 MED ORDER — ENOXAPARIN SODIUM 40 MG/0.4ML ~~LOC~~ SOLN
40.0000 mg | SUBCUTANEOUS | Status: DC
  Administered 2019-01-02: 40 mg via SUBCUTANEOUS
  Filled 2019-01-02: qty 0.4

## 2019-01-02 NOTE — H&P (Signed)
[facility] [place] NAME: Randy Watson    MR#:  [id number]  DATE OF BIRTH:  1961-05-30  DATE OF ADMISSION:  01/02/2019  PRIMARY CARE PHYSICIAN: Mikey College, NP   REQUESTING/REFERRING PHYSICIAN: dr Jacqualine Code  CHIEF COMPLAINT:   Sudden onset dizziness and nausea HISTORY OF PRESENT ILLNESS:  Randy Watson  is a 58 y.o. male with a known history of acid reflux comes to the emergency room after he experienced sudden onset dizziness at 11 o'clock last night. He also felt off balance. Patient felt the room was spinning got nauseated and vomited in the ER. No headache. Denies chest pain or shortness of breath. He recently had a stress test as outpatient which was negative per patient.  In the ER workup shows he has acute cerebellar CVA. Patient is being admitted for further evaluation management.  PAST MEDICAL HISTORY:   Past Medical History:  Diagnosis Date  . Allergy   . Anal fissure   . Constipation   . GERD (gastroesophageal reflux disease)   . History of chicken pox   . Obesity     PAST SURGICAL HISTOIRY:  History reviewed. No pertinent surgical history.  SOCIAL HISTORY:   Social History   Tobacco Use  . Smoking status: Never Smoker  . Smokeless tobacco: Current User    Types: Chew  . Tobacco comment: dip  Substance Use Topics  . Alcohol use: Yes    Comment: occasionally    FAMILY HISTORY:   Family History  Problem Relation Age of Onset  . Cancer Father        renal/pancreatic  . CAD Father   . Prostate cancer Maternal Grandfather   . Bladder Cancer Neg Hx     DRUG ALLERGIES:   Allergies  Allergen Reactions  . Penicillins     REVIEW OF SYSTEMS:  Review of Systems  Constitutional: Negative for chills, fever and weight loss.  HENT: Negative for ear discharge, ear pain and nosebleeds.   Eyes: Negative for blurred vision, pain and discharge.  Respiratory: Negative for sputum production,  shortness of breath, wheezing and stridor.   Cardiovascular: Negative for chest pain, palpitations, orthopnea and PND.  Gastrointestinal: Positive for nausea. Negative for abdominal pain, diarrhea and vomiting.  Genitourinary: Negative for frequency and urgency.  Musculoskeletal: Negative for back pain and joint pain.  Neurological: Positive for dizziness and weakness. Negative for sensory change, speech change and focal weakness.  Psychiatric/Behavioral: Negative for depression and hallucinations. The patient is not nervous/anxious.      MEDICATIONS AT HOME:   Prior to Admission medications   Medication Sig Start Date End Date Taking? Authorizing Provider  cyclobenzaprine (FLEXERIL) 10 MG tablet Take 0.5-1 tablets (5-10 mg total) by mouth 3 (three) times daily as needed for muscle spasms. 12/26/18  Yes Karamalegos, Devonne Doughty, DO  ibuprofen (ADVIL,MOTRIN) 200 MG tablet Take 200-400 mg by mouth every 6 (six) hours as needed for fever or mild pain.    Yes [provider]      VITAL SIGNS:  Blood pressure 134/77, pulse 98, temperature 97.7 F (36.5 C), temperature source Oral, resp. rate 16, height 6\' 2"  (1.88 m), weight 117.9 kg, SpO2 99 %.  PHYSICAL EXAMINATION:  GENERAL:  58 y.o.-year-old patient lying in the bed with no acute distress.  EYES: Pupils equal, round, reactive to light and accommodation. No scleral icterus. Extraocular muscles intact.  HEENT: Head atraumatic, normocephalic. Oropharynx and nasopharynx clear.  NECK:  Supple,  no jugular venous distention. No thyroid enlargement, no tenderness.  LUNGS: Normal breath sounds bilaterally, no wheezing, rales,rhonchi or crepitation. No use of accessory muscles of respiration.  CARDIOVASCULAR: S1, S2 normal. No murmurs, rubs, or gallops.  ABDOMEN: Soft, nontender, nondistended. Bowel sounds present. No organomegaly or mass.  EXTREMITIES: No pedal edema, cyanosis, or clubbing.  NEUROLOGIC: Cranial nerves II through XII are  intact. Muscle strength 5/5 in all extremities. Sensation intact. Gait not checked. No nystagmus PSYCHIATRIC: The patient is alert and oriented x 3.  SKIN: No obvious rash, lesion, or ulcer.   LABORATORY PANEL:   CBC Recent Labs  Lab 01/02/19 0938  WBC 12.8*  HGB 16.1  HCT 46.2  PLT 289   ------------------------------------------------------------------------------------------------------------------  Chemistries  Recent Labs  Lab 01/02/19 0938  NA 135  K 4.1  CL 99  CO2 25  GLUCOSE 177*  BUN 15  CREATININE 0.76  CALCIUM 9.2  AST 23  ALT 27  ALKPHOS 99  BILITOT 0.8   ------------------------------------------------------------------------------------------------------------------  Cardiac Enzymes Recent Labs  Lab 01/02/19 0938  TROPONINI <0.03   ------------------------------------------------------------------------------------------------------------------  RADIOLOGY:  Ct Angio Head W Or Wo Contrast  Result Date: 01/02/2019 CLINICAL DATA:  Dizziness, imbalance, nausea, and vomiting since last night. EXAM: CT ANGIOGRAPHY HEAD AND NECK TECHNIQUE: Multidetector CT imaging of the head and neck was performed using the standard protocol during bolus administration of intravenous contrast. Multiplanar CT image reconstructions and MIPs were obtained to evaluate the vascular anatomy. Carotid stenosis measurements (when applicable) are obtained utilizing NASCET criteria, using the distal internal carotid diameter as the denominator. CONTRAST:  54mL OMNIPAQUE IOHEXOL 350 MG/ML SOLN COMPARISON:  Brain MRI 01/02/2019 FINDINGS: CT HEAD FINDINGS Brain: Left cerebellar hypoattenuation corresponds to the acute infarct on today's MRI. No acute intracranial hemorrhage, mass, midline shift, or extra-axial fluid collection is identified. The ventricles and sulci are normal. Vascular: Calcified atherosclerosis at the skull base. Skull: No fracture or focal osseous lesion. Sinuses: Left  ethmoid air cell opacification. Clear mastoid air cells. Orbits: Unremarkable. Review of the MIP images confirms the above findings CTA NECK FINDINGS Aortic arch: Normal variant aortic arch branching pattern with common origin of the brachiocephalic and left common carotid arteries. Widely patent arch vessel origins. Right carotid system: Patent with mild calcified plaque at the carotid bifurcation. No evidence of dissection or stenosis. Left carotid system: Patent with small volume soft plaque in the mid common carotid artery and at the carotid bifurcation. No evidence of dissection or stenosis. Vertebral arteries: The right vertebral artery is patent and robust in size without evidence of stenosis or dissection. The left vertebral artery is occluded at its origin with partial reconstitution of thready V2 and V3 segments. Skeleton: Mild cervical spondylosis. Other neck: Mild diffuse enlargement, heterogeneity, and nodularity of the thyroid gland with suggestion of an approximately 2 cm nodule in the isthmus. Upper chest: Clear lung apices. Review of the MIP images confirms the above findings CTA HEAD FINDINGS Anterior circulation: The internal carotid arteries are patent from skull base to carotid termini with mild atherosclerotic plaque bilaterally not resulting in significant stenosis. ACAs and MCAs are patent without evidence of proximal branch occlusion or significant proximal stenosis. No aneurysm is identified. Posterior circulation: The intracranial right vertebral artery is widely patent. The left vertebral artery is occluded near the V3-V4 junction with opacification of a small V4 segment more distally. Patent right PICA, bilateral AICA, and bilateral SCA origins are identified. A left PICA is not identified. The basilar artery is  widely patent. There is a small left posterior communicating artery. Both PCAs are patent without evidence of significant proximal stenosis. No aneurysm is identified. Venous  sinuses: Patent. Anatomic variants: None. Review of the MIP images confirms the above findings IMPRESSION: 1. 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 2. Widely patent, robust right vertebral artery. 3. Mild intracranial atherosclerosis without significant anterior circulation stenosis. 4. Widely patent cervical carotid arteries. 5. Multinodular thyroid gland. Nonemergent thyroid ultrasound is recommended for further evaluation. These results were called by telephone at the time of interpretation on 01/02/2019 at 12:10 pm to Dr. Delman Kitten , who verbally acknowledged these results. Electronically Signed   By: Logan Bores M.D.   On: 01/02/2019 12:23   Ct Angio Neck W And/or Wo Contrast  Result Date: 01/02/2019 CLINICAL DATA:  Dizziness, imbalance, nausea, and vomiting since last night. EXAM: CT ANGIOGRAPHY HEAD AND NECK TECHNIQUE: Multidetector CT imaging of the head and neck was performed using the standard protocol during bolus administration of intravenous contrast. Multiplanar CT image reconstructions and MIPs were obtained to evaluate the vascular anatomy. Carotid stenosis measurements (when applicable) are obtained utilizing NASCET criteria, using the distal internal carotid diameter as the denominator. CONTRAST:  62mL OMNIPAQUE IOHEXOL 350 MG/ML SOLN COMPARISON:  Brain MRI 01/02/2019 FINDINGS: CT HEAD FINDINGS Brain: Left cerebellar hypoattenuation corresponds to the acute infarct on today's MRI. No acute intracranial hemorrhage, mass, midline shift, or extra-axial fluid collection is identified. The ventricles and sulci are normal. Vascular: Calcified atherosclerosis at the skull base. Skull: No fracture or focal osseous lesion. Sinuses: Left ethmoid air cell opacification. Clear mastoid air cells. Orbits: Unremarkable. Review of the MIP images confirms the above findings CTA NECK FINDINGS Aortic arch: Normal variant aortic arch branching pattern with common  origin of the brachiocephalic and left common carotid arteries. Widely patent arch vessel origins. Right carotid system: Patent with mild calcified plaque at the carotid bifurcation. No evidence of dissection or stenosis. Left carotid system: Patent with small volume soft plaque in the mid common carotid artery and at the carotid bifurcation. No evidence of dissection or stenosis. Vertebral arteries: The right vertebral artery is patent and robust in size without evidence of stenosis or dissection. The left vertebral artery is occluded at its origin with partial reconstitution of thready V2 and V3 segments. Skeleton: Mild cervical spondylosis. Other neck: Mild diffuse enlargement, heterogeneity, and nodularity of the thyroid gland with suggestion of an approximately 2 cm nodule in the isthmus. Upper chest: Clear lung apices. Review of the MIP images confirms the above findings CTA HEAD FINDINGS Anterior circulation: The internal carotid arteries are patent from skull base to carotid termini with mild atherosclerotic plaque bilaterally not resulting in significant stenosis. ACAs and MCAs are patent without evidence of proximal branch occlusion or significant proximal stenosis. No aneurysm is identified. Posterior circulation: The intracranial right vertebral artery is widely patent. The left vertebral artery is occluded near the V3-V4 junction with opacification of a small V4 segment more distally. Patent right PICA, bilateral AICA, and bilateral SCA origins are identified. A left PICA is not identified. The basilar artery is widely patent. There is a small left posterior communicating artery. Both PCAs are patent without evidence of significant proximal stenosis. No aneurysm is identified. Venous sinuses: Patent. Anatomic variants: None. Review of the MIP images confirms the above findings IMPRESSION: 1. 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 2. Widely patent, robust  right vertebral artery. 3. Mild  intracranial atherosclerosis without significant anterior circulation stenosis. 4. Widely patent cervical carotid arteries. 5. Multinodular thyroid gland. Nonemergent thyroid ultrasound is recommended for further evaluation. These results were called by telephone at the time of interpretation on 01/02/2019 at 12:10 pm to Dr. Delman Kitten , who verbally acknowledged these results. Electronically Signed   By: Logan Bores M.D.   On: 01/02/2019 12:23   Mr Brain Wo Contrast  Result Date: 01/02/2019 CLINICAL DATA:  Vertigo. EXAM: MRI HEAD WITHOUT CONTRAST TECHNIQUE: Multiplanar, multiecho pulse sequences of the brain and surrounding structures were obtained without intravenous contrast. COMPARISON:  None. FINDINGS: Brain: There is a moderate-sized acute left cerebellar infarct in the PICA territory. No intracranial hemorrhage, mass, midline shift, or extra-axial fluid collection is identified. Scattered punctate foci of T2 hyperintensity in the cerebral white matter bilaterally are nonspecific but compatible with mild chronic small vessel ischemic disease. The ventricles and sulci are normal in size. Vascular: Abnormal appearance of the distal left vertebral artery which may reflect slow flow or occlusion versus volume averaging. Other major intracranial vascular flow voids are preserved. Skull and upper cervical spine: Unremarkable bone marrow signal. Sinuses/Orbits: Unremarkable orbits. Left ethmoid air cell mucosal thickening. Clear mastoid air cells. Other: None. IMPRESSION: 1. Acute left cerebellar infarct. 2. Abnormal appearance of the distal left vertebral artery, possibly slow flow or occlusion. Recommend head and neck CTA or MRA for further evaluation. 3. Mild chronic small vessel ischemic disease. Electronically Signed   By: Logan Bores M.D.   On: 01/02/2019 10:33    EKG:    IMPRESSION AND PLAN:  Drury Ardizzone  is a 58 y.o. male with a known history of acid reflux  comes to the emergency room after he experienced sudden onset dizziness at 11 o'clock last night. He also felt off balance. Patient felt the room was spinning got nauseated and vomited in the ER.   1. Acute left cerebellar stroke -came in with sudden onset nausea and dizziness and off balance. -CT angiogram head and neck shows left vertebral artery occlusion origin. -seen by neurology Dr. Irish Elders -PT OT speech to see -aspirin, statins -allow permissive hypertension -echo of the heart  2. [name] continue Protonix  3. DVT prophylaxis subcu Lovenox   All the records are reviewed and case discussed with ED provider.   CODE STATUS:  full TOTAL TIME TAKING CARE OF THIS PATIENT: 45* minutes.    Fritzi Mandes M.D on 01/02/2019 at 1:10 PM  Between 7am to 6pm - Pager - [phone]  After 6pm go to [email or web address] - password [email or web address] [facility]  [facility]  Office  [phone]  CC: Primary care physician; Mikey College, NP

## 2019-01-02 NOTE — Progress Notes (Signed)
OT Cancellation Note  Patient Details Name: Randy Watson MRN: [id number] DOB: [birth year]   Cancelled Treatment:    Reason Eval/Treat Not Completed: Other (comment) Thank you for the OT consult. Order received and chart reviewed. Upon chart review pt noted to be experiencing nausea/vommiting with RN recommending other disciplines re-attempt tomorrow. Will continue to follow acutely and re-attempt at a later date/time as available and pt medically appropriate for OT tx.  Shara Blazing, M.S., OTR/L Ascom: [phone] 01/02/19, 3:30 PM

## 2019-01-02 NOTE — ED Notes (Addendum)
ED TO INPATIENT HANDOFF REPORT  ED Nurse Name and Phone #: Karena Addison [phone]  K Name/Age/Gender Hyman Hopes 58 y.o. male Room/Bed: ED19A/ED19A  Code Status   Code Status: Not on file  Home/SNF/Other Home Patient oriented to: self, place, time and situation Is this baseline? Yes   Triage Complete: Triage complete  Chief Complaint nausea vomiting  Triage Note Patient states he has had nausea and vomiting since 11pm yesterday. Reports he is also very dizzy and off balance. Patient pale and uncomfortable in triage. Denies recent illness or contact with anyone sick. Denies pain or fever.    Allergies Allergies  Allergen Reactions  . Penicillins     Level of Care/Admitting Diagnosis ED Disposition    ED Disposition Condition Comment   Admit  The patient appears reasonably stabilized for admission considering the current resources, flow, and capabilities available in the ED at this time, and I doubt any other [facility] requiring further screening and/or treatment in the ED prior to admission is  present.       B Medical/Surgery History Past Medical History:  Diagnosis Date  . Allergy   . Anal fissure   . Constipation   . GERD (gastroesophageal reflux disease)   . History of chicken pox   . Obesity    History reviewed. No pertinent surgical history.   A IV Location/Drains/Wounds Patient Lines/Drains/Airways Status   Active Line/Drains/Airways    Name:   Placement date:   Placement time:   Site:   Days:   Peripheral IV 01/02/19 Right Antecubital   01/02/19    0948    Antecubital   less than 1          Intake/Output Last 24 hours No intake or output data in the 24 hours ending 01/02/19 1203  Labs/Imaging Results for orders placed or performed during the hospital encounter of 01/02/19 (from the past 48 hour(s))  CBC     Status: Abnormal   Collection Time: 01/02/19  9:38 AM  Result Value Ref Range   WBC 12.8 (H) 4.0 - 10.5 K/uL   RBC 5.13 4.22 - 5.81 MIL/uL    Hemoglobin 16.1 13.0 - 17.0 g/dL   HCT 46.2 39.0 - 52.0 %   MCV 90.1 80.0 - 100.0 fL   MCH 31.4 26.0 - 34.0 pg   MCHC 34.8 30.0 - 36.0 g/dL   RDW 12.0 11.5 - 15.5 %   Platelets 289 150 - 400 K/uL   nRBC 0.0 0.0 - 0.2 %    Comment: Performed at [facility], [address] [place], [place] [address]  Comprehensive metabolic panel     Status: Abnormal   Collection Time: 01/02/19  9:38 AM  Result Value Ref Range   Sodium 135 135 - 145 mmol/L   Potassium 4.1 3.5 - 5.1 mmol/L   Chloride 99 98 - 111 mmol/L   CO2 25 22 - 32 mmol/L   Glucose, Bld 177 (H) 70 - 99 mg/dL   BUN 15 6 - 20 mg/dL   Creatinine, Ser 0.76 0.61 - 1.24 mg/dL   Calcium 9.2 8.9 - 10.3 mg/dL   Total Protein 7.9 6.5 - 8.1 g/dL   Albumin 4.2 3.5 - 5.0 g/dL   AST 23 15 - 41 U/L   ALT 27 0 - 44 U/L   Alkaline Phosphatase 99 38 - 126 U/L   Total Bilirubin 0.8 0.3 - 1.2 mg/dL   GFR calc non Af Amer >60 >60 mL/min   GFR calc  Af Amer >60 >60 mL/min   Anion gap 11 5 - 15    Comment: Performed at [facility], [address] [place], [place] [address]  Lipase, blood     Status: None   Collection Time: 01/02/19  9:38 AM  Result Value Ref Range   Lipase 22 11 - 51 U/L    Comment: Performed at [facility], ., [place], [place] [address]  Troponin I - ONCE - STAT     Status: None   Collection Time: 01/02/19  9:38 AM  Result Value Ref Range   Troponin I <0.03 <0.03 ng/mL    Comment: Performed at [facility], [address] [place], [place] [address]  APTT     Status: None   Collection Time: 01/02/19  9:43 AM  Result Value Ref Range   aPTT 27 24 - 36 seconds    Comment: Performed at [facility], [address] [place], [place] [address]  Protime-INR     Status: None   Collection Time: 01/02/19  9:43 AM  Result Value Ref Range   Prothrombin Time 12.6 11.4 - 15.2 seconds   INR 1.0 0.8 - 1.2    Comment: (NOTE) INR goal varies based on device and disease  states. Performed at [facility], [address] [place], [place] [address]    Mr Brain TK Contrast  Result Date: 01/02/2019 CLINICAL DATA:  Vertigo. EXAM: MRI HEAD WITHOUT CONTRAST TECHNIQUE: Multiplanar, multiecho pulse sequences of the brain and surrounding structures were obtained without intravenous contrast. COMPARISON:  None. FINDINGS: Brain: There is a moderate-sized acute left cerebellar infarct in the PICA territory. No intracranial hemorrhage, mass, midline shift, or extra-axial fluid collection is identified. Scattered punctate foci of T2 hyperintensity in the cerebral white matter bilaterally are nonspecific but compatible with mild chronic small vessel ischemic disease. The ventricles and sulci are normal in size. Vascular: Abnormal appearance of the distal left vertebral artery which may reflect slow flow or occlusion versus volume averaging. Other major intracranial vascular flow voids are preserved. Skull and upper cervical spine: Unremarkable bone marrow signal. Sinuses/Orbits: Unremarkable orbits. Left ethmoid air cell mucosal thickening. Clear mastoid air cells. Other: None. IMPRESSION: 1. Acute left cerebellar infarct. 2. Abnormal appearance of the distal left vertebral artery, possibly slow flow or occlusion. Recommend head and neck CTA or MRA for further evaluation. 3. Mild chronic small vessel ischemic disease. Electronically Signed   By: Logan Bores M.D.   On: 01/02/2019 10:33    Pending Labs Unresulted Labs (From admission, onward)    Start     Ordered   01/02/19 1134  SARS Coronavirus 2 (CEPHEID - Performed in [facility] hospital lab), [facility] Order  (Asymptomatic Patients Labs)  ONCE - STAT,   STAT    Question:  Rule Out  Answer:  Yes   01/02/19 1133          Vitals/Pain Today's Vitals   01/02/19 0936 01/02/19 1043 01/02/19 1130 01/02/19 1200  BP: (!) 154/98 (!) 148/90 (!) 154/88   Pulse: 66 72 62   Resp: 16 13 16    Temp:    97.7 F (36.5 C)   TempSrc:    Oral  SpO2: 95% 97% 100%   Weight:      Height:      PainSc:        Isolation Precautions No active isolations  Medications Medications  ondansetron (ZOFRAN) injection 4 mg (4 mg Intravenous Given 01/02/19 0949)  meclizine (ANTIVERT) tablet 25 mg (25 mg Oral Given 01/02/19 0949)  aspirin chewable tablet 324 mg (324 mg Oral Given 01/02/19 1137)  iohexol (OMNIPAQUE) 350 MG/ML injection 75 mL (75 mLs Intravenous Contrast Given 01/02/19 1142)    Mobility walks Moderate fall risk   Focused Assessments    R Recommendations: See Admitting Provider Note  Report given to:  Margaretann Loveless, RN

## 2019-01-02 NOTE — Plan of Care (Signed)
  Problem: Education: Goal: Knowledge of disease or condition will improve 01/02/2019 2330 by Jenene Slicker, RN Outcome: Progressing 01/02/2019 2329 by Jenene Slicker, RN Outcome: Progressing Goal: Knowledge of secondary prevention will improve 01/02/2019 2330 by Jenene Slicker, RN Outcome: Progressing 01/02/2019 2329 by Jenene Slicker, RN Outcome: Progressing Goal: Knowledge of patient specific risk factors addressed and post discharge goals established will improve 01/02/2019 2330 by Jenene Slicker, RN Outcome: Progressing 01/02/2019 2329 by Jenene Slicker, RN Outcome: Progressing   Problem: Coping: Goal: Will verbalize positive feelings about self 01/02/2019 2330 by Jenene Slicker, RN Outcome: Progressing 01/02/2019 2329 by Jenene Slicker, RN Outcome: Progressing Goal: Will identify appropriate support needs 01/02/2019 2330 by Jenene Slicker, RN Outcome: Progressing 01/02/2019 2329 by Jenene Slicker, RN Outcome: Progressing   Problem: Ischemic Stroke/TIA Tissue Perfusion: Goal: Complications of ischemic stroke/TIA will be minimized 01/02/2019 2330 by Jenene Slicker, RN Outcome: Progressing 01/02/2019 2329 by Jenene Slicker, RN Outcome: Progressing   Problem: Education: Goal: Knowledge of General Education information will improve Description Including pain rating scale, medication(s)/side effects and non-pharmacologic comfort measures 01/02/2019 2330 by Jenene Slicker, RN Outcome: Progressing 01/02/2019 2329 by Jenene Slicker, RN Outcome: Progressing   Problem: Clinical Measurements: Goal: Ability to maintain clinical measurements within normal limits will improve 01/02/2019 2330 by Jenene Slicker, RN Outcome: Progressing 01/02/2019 2329 by Jacelyn Pi D, RN Outcome: Progressing Goal: Will remain free from infection 01/02/2019 2330 by Jenene Slicker, RN Outcome: Progressing 01/02/2019 2329 by Jacelyn Pi D, RN Outcome: Progressing Goal: Diagnostic test  results will improve 01/02/2019 2330 by Jenene Slicker, RN Outcome: Progressing 01/02/2019 2329 by Jacelyn Pi D, RN Outcome: Progressing Goal: Respiratory complications will improve 01/02/2019 2330 by Jenene Slicker, RN Outcome: Progressing 01/02/2019 2329 by Jacelyn Pi D, RN Outcome: Progressing Goal: Cardiovascular complication will be avoided 01/02/2019 2330 by Jenene Slicker, RN Outcome: Progressing 01/02/2019 2329 by Jenene Slicker, RN Outcome: Progressing   Problem: Pain Managment: Goal: General experience of comfort will improve 01/02/2019 2330 by Jenene Slicker, RN Outcome: Progressing 01/02/2019 2329 by Jacelyn Pi D, RN Outcome: Progressing   Problem: Safety: Goal: Ability to remain free from injury will improve 01/02/2019 2330 by Jenene Slicker, RN Outcome: Progressing 01/02/2019 2329 by Jenene Slicker, RN Outcome: Progressing

## 2019-01-02 NOTE — Progress Notes (Signed)
SLP Cancellation Note  Patient Details Name: Randy Watson MRN: [id number] DOB: [birth year]   Cancelled treatment:       Reason Eval/Treat Not Completed: Medical issues which prohibited therapy; Chart reviewed. Nsg consulted. Pt w/new L cerebellar infarct. Nsg reports pt is significantly nauseaus and unable to participate in SLP cognitive linguistic screen/evaluation at this time. Nsg rec reattempt tomorrow. SLP to f/u and reattempt tomorrow.   Dagmar Adcox, MA, CCC-SLP 01/02/2019, 2:44 PM

## 2019-01-02 NOTE — Progress Notes (Signed)
Family Meeting Note  Advance Directive:no  Today a meeting took place with the patient in the ER. She came in with nausea dizziness and off balance. Found to have acute cerebellar stroke. Has history of [name]. 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 Code status address patient wants to be full code. Time spent 16 minutes. Fritzi Mandes, MD

## 2019-01-02 NOTE — Consult Note (Addendum)
Reason for Consult: N/V Referring Physician: Dr. Jacqualine Code  CC: N/V   HPI: Randy Watson is an 58 y.o. male smoking(dips), ETOH use, chronic back pain on muscle relaxants. Last known well at 11 PM last night. Presented with acute onset of vertigo, N/V.  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    Past Medical History:  Diagnosis Date  . Allergy   . Anal fissure   . Constipation   . GERD (gastroesophageal reflux disease)   . History of chicken pox   . Obesity     History reviewed. No pertinent surgical history.  Family History  Problem Relation Age of Onset  . Cancer Father        renal/pancreatic  . CAD Father   . Prostate cancer Maternal Grandfather   . Bladder Cancer Neg Hx     Social History:  reports that he has never smoked. His smokeless tobacco use includes chew. He reports current alcohol use. He reports that he does not use drugs.  Allergies  Allergen Reactions  . Penicillins     Medications: I have reviewed the patient's current medications.  ROS: History obtained from the patient  General ROS: negative for - chills, fatigue, fever, night sweats, weight gain or weight loss Psychological ROS: negative for - behavioral disorder, hallucinations, memory difficulties, mood swings or suicidal ideation Ophthalmic ROS: negative for - blurry vision, double vision, eye pain or loss of vision ENT ROS: negative for - epistaxis, nasal discharge, oral lesions, sore throat, tinnitus or vertigo Allergy and Immunology ROS: negative for - hives or itchy/watery eyes Hematological and Lymphatic ROS: negative for - bleeding problems, bruising or swollen lymph nodes Endocrine ROS: negative for - galactorrhea, hair pattern changes, polydipsia/polyuria or temperature intolerance Respiratory ROS: negative for - cough, hemoptysis, shortness of breath or wheezing Cardiovascular ROS: negative for - chest pain, dyspnea on exertion, edema or irregular heartbeat Gastrointestinal ROS:  negative for - abdominal pain, diarrhea, hematemesis, nausea/vomiting or stool incontinence Genito-Urinary ROS: negative for - dysuria, hematuria, incontinence or urinary frequency/urgency Musculoskeletal ROS: negative for - joint swelling or muscular weakness Neurological ROS: as noted in HPI Dermatological ROS: negative for rash and skin lesion changes  Physical Examination: Blood pressure (!) 154/88, pulse 62, temperature 97.7 F (36.5 C), temperature source Oral, resp. rate 16, height 6\' 2"  (1.88 m), weight 117.9 kg, SpO2 100 %.   Neurological Examination   Mental Status: Alert, oriented, thought content appropriate.  Speech fluent without evidence of aphasia.  Able to follow 3 step commands without difficulty. Cranial Nerves: II: Discs flat bilaterally; Visual fields grossly normal, pupils equal, round, reactive to light and accommodation III,IV, VI: ptosis not present, extra-ocular motions intact bilaterally V,VII: smile symmetric, facial light touch sensation normal bilaterally VIII: hearing normal bilaterally IX,X: gag reflex present XI: bilateral shoulder shrug XII: midline tongue extension Motor: Right : Upper extremity   5/5    Left:     Upper extremity   5/5  Lower extremity   5/5     Lower extremity   5/5 Tone and bulk:normal tone throughout; no atrophy noted Sensory: Pinprick and light touch intact throughout, bilaterally Deep Tendon Reflexes: 2+ and symmetric throughout Plantars: Right: downgoing   Left: downgoing Cerebellar: L dysmetria  Gait: not tested.       Laboratory Studies:   Basic Metabolic Panel: Recent Labs  Lab 01/02/19 0938  NA 135  K 4.1  CL 99  CO2 25  GLUCOSE 177*  BUN 15  CREATININE 0.76  CALCIUM 9.2    Liver Function Tests: Recent Labs  Lab 01/02/19 0938  AST 23  ALT 27  ALKPHOS 99  BILITOT 0.8  PROT 7.9  ALBUMIN 4.2   Recent Labs  Lab 01/02/19 0938  LIPASE 22   No results for input(s): AMMONIA in the last 168  hours.  CBC: Recent Labs  Lab 01/02/19 0938  WBC 12.8*  HGB 16.1  HCT 46.2  MCV 90.1  PLT 289    Cardiac Enzymes: Recent Labs  Lab 01/02/19 0938  TROPONINI <0.03    BNP: Invalid input(s): POCBNP  CBG: No results for input(s): GLUCAP in the last 168 hours.  Microbiology: Results for orders placed or performed in visit on 12/19/18  Urine Culture     Status: None   Collection Time: 12/19/18  1:33 PM  Result Value Ref Range Status   MICRO NUMBER: [id number]  Final   SPECIMEN QUALITY: Adequate  Final   Sample Source NOT GIVEN  Final   STATUS: FINAL  Final   Result:   Final    Multiple organisms present, each less than 10,000 CFU/mL. These organisms, commonly found on external and internal genitalia, are considered to be colonizers. No further testing performed.    Coagulation Studies: Recent Labs    01/02/19 0943  LABPROT 12.6  INR 1.0    Urinalysis: No results for input(s): COLORURINE, LABSPEC, PHURINE, GLUCOSEU, HGBUR, BILIRUBINUR, KETONESUR, PROTEINUR, UROBILINOGEN, NITRITE, LEUKOCYTESUR in the last 168 hours.  Invalid input(s): APPERANCEUR  Lipid Panel:  No results found for: CHOL, TRIG, HDL, CHOLHDL, VLDL, LDLCALC  HgbA1C:  Lab Results  Component Value Date   HGBA1C 5.7 (H) 11/23/2016    Urine Drug Screen:  No results found for: LABOPIA, COCAINSCRNUR, LABBENZ, AMPHETMU, THCU, LABBARB  Alcohol Level: No results for input(s): ETH in the last 168 hours.  Other results: EKG: normal EKG, normal sinus rhythm, unchanged from previous tracings.  Imaging: Mr Brain Wo Contrast  Result Date: 01/02/2019 CLINICAL DATA:  Vertigo. EXAM: MRI HEAD WITHOUT CONTRAST TECHNIQUE: Multiplanar, multiecho pulse sequences of the brain and surrounding structures were obtained without intravenous contrast. COMPARISON:  None. FINDINGS: Brain: There is a moderate-sized acute left cerebellar infarct in the PICA territory. No intracranial hemorrhage, mass, midline shift, or  extra-axial fluid collection is identified. Scattered punctate foci of T2 hyperintensity in the cerebral white matter bilaterally are nonspecific but compatible with mild chronic small vessel ischemic disease. The ventricles and sulci are normal in size. Vascular: Abnormal appearance of the distal left vertebral artery which may reflect slow flow or occlusion versus volume averaging. Other major intracranial vascular flow voids are preserved. Skull and upper cervical spine: Unremarkable bone marrow signal. Sinuses/Orbits: Unremarkable orbits. Left ethmoid air cell mucosal thickening. Clear mastoid air cells. Other: None. IMPRESSION: 1. Acute left cerebellar infarct. 2. Abnormal appearance of the distal left vertebral artery, possibly slow flow or occlusion. Recommend head and neck CTA or MRA for further evaluation. 3. Mild chronic small vessel ischemic disease. Electronically Signed   By: Logan Bores M.D.   On: 01/02/2019 10:33     Assessment/Plan:   58 y.o. male smoking(dips), ETOH use, chronic back pain on muscle relaxants. Last known well at 11 PM last night. Presented with acute onset of vertigo, N/V.  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    No TPA as NIHSS of 1 for dysmetria and outside of window.   Admission ASA 81mg  and statin daily Pt/ot Positional vertigo. Trial of  meclizine if positional vertigo is still present. CTA pending to look if there is a need for dual anti plt therapy  Leotis Pain  01/02/2019, 12:02 PM

## 2019-01-02 NOTE — ED Provider Notes (Addendum)
[facility] Emergency Department Provider Note   ____________________________________________   First MD Initiated Contact with Patient 01/02/19 [phone]     (approximate)  I have reviewed the triage vital signs and the nursing notes.   HISTORY  Chief Complaint Emesis and Dizziness    HPI Randy Watson is a 58 y.o. male reports that he started feeling very dizzy rather suddenly at about 11:00 last night.  He felt dizzy and off-balance.  Reports whenever he tries to get up and walk he feels off balance.  Feels like the room was spinning about, he got nauseated and caused him to vomit.  No headache.  Denies abdominal pain.  No chest pain or trouble breathing.  Reports he seems okay as long as he sitting still, but if he gets up tries to walk or do things like that he gets extremely dizzy.  No recent ear problems.  No infections.  No fevers or chills.  Reports he has been on a new muscle relaxant for his back for about a week, had noticed any symptoms of than the occasional lower back spasm seems to be getting better  Currently feels a little nauseated, but overall okay as long as he sitting still.   Past Medical History:  Diagnosis Date   Allergy    Anal fissure    Constipation    GERD (gastroesophageal reflux disease)    History of chicken pox    Obesity     There are no active problems to display for this patient.   History reviewed. No pertinent surgical history.  Prior to Admission medications   Medication Sig Start Date End Date Taking? Authorizing Provider  cyclobenzaprine (FLEXERIL) 10 MG tablet Take 0.5-1 tablets (5-10 mg total) by mouth 3 (three) times daily as needed for muscle spasms. 12/26/18  Yes Karamalegos, Devonne Doughty, DO  ibuprofen (ADVIL,MOTRIN) 200 MG tablet Take 200-400 mg by mouth every 6 (six) hours as needed for fever or mild pain.    Yes [provider]    Allergies Penicillins  Family History  Problem  Relation Age of Onset   Cancer Father        renal/pancreatic   CAD Father    Prostate cancer Maternal Grandfather    Bladder Cancer Neg Hx     Social History Social History   Tobacco Use   Smoking status: Never Smoker   Smokeless tobacco: Current User    Types: Chew   Tobacco comment: dip  Substance Use Topics   Alcohol use: Yes    Comment: occasionally   Drug use: No    Review of Systems Constitutional: No fever/chills Eyes: No visual changes. ENT: No sore throat. Cardiovascular: Denies chest pain. Respiratory: Denies shortness of breath. Gastrointestinal: No abdominal pain.  Couple episodes of vomiting due to feeling nauseated. Genitourinary: Negative for dysuria. Musculoskeletal: Negative for back pain. Skin: Negative for rash. Neurological: Negative for headaches, areas of focal weakness or numbness.  No trouble speaking.  Has not noticed any weakness in arm or leg.  Reports feels very off balance    ____________________________________________   PHYSICAL EXAM:  VITAL SIGNS: ED Triage Vitals  Enc Vitals Group     BP 01/02/19 0936 (!) 154/98     Pulse Rate 01/02/19 0936 66     Resp 01/02/19 0936 16     Temp --      Temp src --      SpO2 01/02/19 0936 95 %  Weight 01/02/19 0935 260 lb (117.9 kg)     Height 01/02/19 0935 6\' 2"  (1.88 m)     Head Circumference --      Peak Flow --      Pain Score 01/02/19 0935 0     Pain Loc --      Pain Edu? --      Excl. in [place]? --     Constitutional: Alert and oriented. Well appearing and in no acute distress.  Perhaps looks a little bit nauseated, but overall in no distress.  Very pleasant. Eyes: Conjunctivae are normal.  Extraocular movements are normal. Head: Atraumatic.  There is no nystagmus.  Patient reports he does not want to sit up or move his head because he knows it will make it worse though. Nose: No congestion/rhinnorhea. Mouth/Throat: Mucous membranes are moist. Neck: No stridor.    Cardiovascular: Normal rate, regular rhythm. Grossly normal heart sounds.  Good peripheral circulation. Respiratory: Normal respiratory effort.  No retractions. Lungs CTAB. Gastrointestinal: Soft and nontender. No distention. Musculoskeletal: No lower extremity tenderness nor edema. Neurologic:  Normal speech and language. No gross focal neurologic deficits are appreciated.  Cranial nerve exam is normal.  Extraocular movements are normal.  5 out of 5 strength in all extremities.  No pronator drift in any extremity.  No ataxia. Skin:  Skin is warm, dry and intact. No rash noted. Psychiatric: Mood and affect are normal. Speech and behavior are normal.  ____________________________________________   LABS (all labs ordered are listed, but only abnormal results are displayed)  Labs Reviewed  CBC - Abnormal; Notable for the following components:      Result Value   WBC 12.8 (*)    All other components within normal limits  COMPREHENSIVE METABOLIC PANEL - Abnormal; Notable for the following components:   Glucose, Bld 177 (*)    All other components within normal limits  SARS CORONAVIRUS 2 (HOSPITAL ORDER, [facility])  LIPASE, BLOOD  TROPONIN I  APTT  PROTIME-INR  CBG MONITORING, ED   ____________________________________________  EKG  Reviewed interpreted at 940 Heart rate 70 QRS 100 QTc 450 PR interval normal Normal sinus rhythm, no evidence of acute ischemia.  Some slight baseline wander.  Small left anterior fascicular block ____________________________________________  RADIOLOGY  Ct Angio Head W Or Wo Contrast  Result Date: 01/02/2019 CLINICAL DATA:  Dizziness, imbalance, nausea, and vomiting since last night. EXAM: CT ANGIOGRAPHY HEAD AND NECK TECHNIQUE: Multidetector CT imaging of the head and neck was performed using the standard protocol during bolus administration of intravenous contrast. Multiplanar CT image reconstructions and MIPs were  obtained to evaluate the vascular anatomy. Carotid stenosis measurements (when applicable) are obtained utilizing NASCET criteria, using the distal internal carotid diameter as the denominator. CONTRAST:  31mL OMNIPAQUE IOHEXOL 350 MG/ML SOLN COMPARISON:  Brain MRI 01/02/2019 FINDINGS: CT HEAD FINDINGS Brain: Left cerebellar hypoattenuation corresponds to the acute infarct on today's MRI. No acute intracranial hemorrhage, mass, midline shift, or extra-axial fluid collection is identified. The ventricles and sulci are normal. Vascular: Calcified atherosclerosis at the skull base. Skull: No fracture or focal osseous lesion. Sinuses: Left ethmoid air cell opacification. Clear mastoid air cells. Orbits: Unremarkable. Review of the MIP images confirms the above findings CTA NECK FINDINGS Aortic arch: Normal variant aortic arch branching pattern with common origin of the brachiocephalic and left common carotid arteries. Widely patent arch vessel origins. Right carotid system: Patent with mild calcified plaque at the carotid bifurcation. No evidence  of dissection or stenosis. Left carotid system: Patent with small volume soft plaque in the mid common carotid artery and at the carotid bifurcation. No evidence of dissection or stenosis. Vertebral arteries: The right vertebral artery is patent and robust in size without evidence of stenosis or dissection. The left vertebral artery is occluded at its origin with partial reconstitution of thready V2 and V3 segments. Skeleton: Mild cervical spondylosis. Other neck: Mild diffuse enlargement, heterogeneity, and nodularity of the thyroid gland with suggestion of an approximately 2 cm nodule in the isthmus. Upper chest: Clear lung apices. Review of the MIP images confirms the above findings CTA HEAD FINDINGS Anterior circulation: The internal carotid arteries are patent from skull base to carotid termini with mild atherosclerotic plaque bilaterally not resulting in significant  stenosis. ACAs and MCAs are patent without evidence of proximal branch occlusion or significant proximal stenosis. No aneurysm is identified. Posterior circulation: The intracranial right vertebral artery is widely patent. The left vertebral artery is occluded near the V3-V4 junction with opacification of a small V4 segment more distally. Patent right PICA, bilateral AICA, and bilateral SCA origins are identified. A left PICA is not identified. The basilar artery is widely patent. There is a small left posterior communicating artery. Both PCAs are patent without evidence of significant proximal stenosis. No aneurysm is identified. Venous sinuses: Patent. Anatomic variants: None. Review of the MIP images confirms the above findings IMPRESSION: 1. 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 2. Widely patent, robust right vertebral artery. 3. Mild intracranial atherosclerosis without significant anterior circulation stenosis. 4. Widely patent cervical carotid arteries. 5. Multinodular thyroid gland. Nonemergent thyroid ultrasound is recommended for further evaluation. These results were called by telephone at the time of interpretation on 01/02/2019 at 12:10 pm to Dr. Delman Kitten , who verbally acknowledged these results. Electronically Signed   By: Logan Bores M.D.   On: 01/02/2019 12:23   Ct Angio Neck W And/or Wo Contrast  Result Date: 01/02/2019 CLINICAL DATA:  Dizziness, imbalance, nausea, and vomiting since last night. EXAM: CT ANGIOGRAPHY HEAD AND NECK TECHNIQUE: Multidetector CT imaging of the head and neck was performed using the standard protocol during bolus administration of intravenous contrast. Multiplanar CT image reconstructions and MIPs were obtained to evaluate the vascular anatomy. Carotid stenosis measurements (when applicable) are obtained utilizing NASCET criteria, using the distal internal carotid diameter as the denominator. CONTRAST:  44mL OMNIPAQUE  IOHEXOL 350 MG/ML SOLN COMPARISON:  Brain MRI 01/02/2019 FINDINGS: CT HEAD FINDINGS Brain: Left cerebellar hypoattenuation corresponds to the acute infarct on today's MRI. No acute intracranial hemorrhage, mass, midline shift, or extra-axial fluid collection is identified. The ventricles and sulci are normal. Vascular: Calcified atherosclerosis at the skull base. Skull: No fracture or focal osseous lesion. Sinuses: Left ethmoid air cell opacification. Clear mastoid air cells. Orbits: Unremarkable. Review of the MIP images confirms the above findings CTA NECK FINDINGS Aortic arch: Normal variant aortic arch branching pattern with common origin of the brachiocephalic and left common carotid arteries. Widely patent arch vessel origins. Right carotid system: Patent with mild calcified plaque at the carotid bifurcation. No evidence of dissection or stenosis. Left carotid system: Patent with small volume soft plaque in the mid common carotid artery and at the carotid bifurcation. No evidence of dissection or stenosis. Vertebral arteries: The right vertebral artery is patent and robust in size without evidence of stenosis or dissection. The left vertebral artery is occluded at its origin with partial reconstitution of thready  V2 and V3 segments. Skeleton: Mild cervical spondylosis. Other neck: Mild diffuse enlargement, heterogeneity, and nodularity of the thyroid gland with suggestion of an approximately 2 cm nodule in the isthmus. Upper chest: Clear lung apices. Review of the MIP images confirms the above findings CTA HEAD FINDINGS Anterior circulation: The internal carotid arteries are patent from skull base to carotid termini with mild atherosclerotic plaque bilaterally not resulting in significant stenosis. ACAs and MCAs are patent without evidence of proximal branch occlusion or significant proximal stenosis. No aneurysm is identified. Posterior circulation: The intracranial right vertebral artery is widely patent.  The left vertebral artery is occluded near the V3-V4 junction with opacification of a small V4 segment more distally. Patent right PICA, bilateral AICA, and bilateral SCA origins are identified. A left PICA is not identified. The basilar artery is widely patent. There is a small left posterior communicating artery. Both PCAs are patent without evidence of significant proximal stenosis. No aneurysm is identified. Venous sinuses: Patent. Anatomic variants: None. Review of the MIP images confirms the above findings IMPRESSION: 1. 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 2. Widely patent, robust right vertebral artery. 3. Mild intracranial atherosclerosis without significant anterior circulation stenosis. 4. Widely patent cervical carotid arteries. 5. Multinodular thyroid gland. Nonemergent thyroid ultrasound is recommended for further evaluation. These results were called by telephone at the time of interpretation on 01/02/2019 at 12:10 pm to Dr. Delman Kitten , who verbally acknowledged these results. Electronically Signed   By: Logan Bores M.D.   On: 01/02/2019 12:23   Mr Brain Wo Contrast  Result Date: 01/02/2019 CLINICAL DATA:  Vertigo. EXAM: MRI HEAD WITHOUT CONTRAST TECHNIQUE: Multiplanar, multiecho pulse sequences of the brain and surrounding structures were obtained without intravenous contrast. COMPARISON:  None. FINDINGS: Brain: There is a moderate-sized acute left cerebellar infarct in the PICA territory. No intracranial hemorrhage, mass, midline shift, or extra-axial fluid collection is identified. Scattered punctate foci of T2 hyperintensity in the cerebral white matter bilaterally are nonspecific but compatible with mild chronic small vessel ischemic disease. The ventricles and sulci are normal in size. Vascular: Abnormal appearance of the distal left vertebral artery which may reflect slow flow or occlusion versus volume averaging. Other major intracranial  vascular flow voids are preserved. Skull and upper cervical spine: Unremarkable bone marrow signal. Sinuses/Orbits: Unremarkable orbits. Left ethmoid air cell mucosal thickening. Clear mastoid air cells. Other: None. IMPRESSION: 1. Acute left cerebellar infarct. 2. Abnormal appearance of the distal left vertebral artery, possibly slow flow or occlusion. Recommend head and neck CTA or MRA for further evaluation. 3. Mild chronic small vessel ischemic disease. Electronically Signed   By: Logan Bores M.D.   On: 01/02/2019 10:33     Radiology results reviewed, discussed with radiologist as well as Dr. Irish Elders who also spoke with radiologist regarding CT angiogram ____________________________________________   PROCEDURES  Procedure(s) performed: None  Procedures  Critical Care performed: Yes, see critical care note(s)  CRITICAL CARE Performed by: Delman Kitten   Total critical care time: 40 minutes  Critical care time was exclusive of separately billable procedures and treating other patients.  Critical care was necessary to treat or prevent imminent or life-threatening deterioration.  Critical care was time spent personally by me on the following activities: development of treatment plan with patient and/or surrogate as well as nursing, discussions with consultants, evaluation of patient's response to treatment, examination of patient, obtaining history from patient or surrogate, ordering and performing treatments and interventions, ordering  and review of laboratory studies, ordering and review of radiographic studies, pulse oximetry and re-evaluation of patient's condition.  ____________________________________________   INITIAL IMPRESSION / ASSESSMENT AND PLAN / ED COURSE  Pertinent labs & imaging results that were available during my care of the patient were reviewed by me and considered in my medical decision making (see chart for details).   Patient presents for nausea, dizziness,  describes symptoms that seem suspicious for vertigo, given the patient's age and history though I think also central etiology such as a small or subtle stroke is also needed to be evaluated.  He denies acute cardiac, pulmonary, or intra-abdominal symptoms other than becoming nauseated and vomiting from what he describes as nausea secondary to the dizziness.  Very reassuring abdominal exam without pain or discomfort in any quadrant.  Normal cardiac and pulmonary exam.  Discussed with patient, will provide antiemetic, also Antivert, swallow screen, and obtain MRI of the brain to exclude central process though I suspect likely peripheral vertigo based on his presentation today.  Clinical Course as of Jan 01 1226  Mon Jan 02, 2019  1158 Code stroke activated after MRI result was reviewed.  CT angios head neck also requested.  Based on the patient's symptoms I do not think the patient has a large vessel occlusion that would be obviously amenable to neuro interventional care, but obviously will follow this with recommendations from neurology as well as CT angiogram.  Patient's last known well was at 11 PM last night, he is clearly not a TPA candidate due to time.   [MQ]  1200 Dr. Irish Elders saw and evaluated patient.    [MQ]  1200 Dr. Margot Chimes patient is NOT an interventional candidate and can be admitted to [facility]    [MQ]    Clinical Course User Index [MQ] Delman Kitten, MD   ----------------------------------------- 12:27 PM on 01/02/2019 -----------------------------------------  Patient given antiplatelet therapy, does not have a lesion amenable to interventional treatment per Dr. Irish Elders.  Patient okay to be admitted to [facility].  Case discussed with Dr. Darvin Neighbours as well.  Patient agreeable understanding plan for admission   Randy Watson was evaluated in Emergency Department on 01/02/2019 for the symptoms described in the history of present illness. He was evaluated in the  context of the global COVID-19 pandemic, which necessitated consideration that the patient might be at risk for infection with the SARS-CoV-2 virus that causes COVID-19. Institutional protocols and algorithms that pertain to the evaluation of patients at risk for COVID-19 are in a state of rapid change based on information released by regulatory bodies including the CDC and federal and state organizations. These policies and algorithms were followed during the patient's care in the ED.   Patient tested screen for COVID given his need for admission, he does not exhibit any obvious symptoms to suggest he has coronavirus. ____________________________________________   FINAL CLINICAL IMPRESSION(S) / ED DIAGNOSES  Final diagnoses:  Acute embolic stroke [facility])        Note:  This document was prepared using Dragon voice recognition software and may include unintentional dictation errors       Delman Kitten, MD 01/02/19 1228    Delman Kitten, MD 01/02/19 1228

## 2019-01-02 NOTE — ED Triage Notes (Signed)
Patient states he has had nausea and vomiting since 11pm yesterday. Reports he is also very dizzy and off balance. Patient pale and uncomfortable in triage. Denies recent illness or contact with anyone sick. Denies pain or fever.

## 2019-01-02 NOTE — ED Notes (Signed)
MD at bedside. 

## 2019-01-02 NOTE — Consult Note (Signed)
Reason for Consult: N/V Referring Physician: Dr. Jacqualine Code  CC: N/V   HPI: Randy Watson is an 58 y.o. male smoking(dips), ETOH use, chronic back pain on muscle relaxants. Last known well at 11 PM last night. Presented with acute onset of vertigo, N/V.  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    Past Medical History:  Diagnosis Date  . Allergy   . Anal fissure   . Constipation   . GERD (gastroesophageal reflux disease)   . History of chicken pox   . Obesity     History reviewed. No pertinent surgical history.  Family History  Problem Relation Age of Onset  . Cancer Father        renal/pancreatic  . CAD Father   . Prostate cancer Maternal Grandfather   . Bladder Cancer Neg Hx     Social History:  reports that he has never smoked. His smokeless tobacco use includes chew. He reports current alcohol use. He reports that he does not use drugs.  Allergies  Allergen Reactions  . Penicillins     Medications: I have reviewed the patient's current medications.  ROS: History obtained from the patient  General ROS: negative for - chills, fatigue, fever, night sweats, weight gain or weight loss Psychological ROS: negative for - behavioral disorder, hallucinations, memory difficulties, mood swings or suicidal ideation Ophthalmic ROS: negative for - blurry vision, double vision, eye pain or loss of vision ENT ROS: negative for - epistaxis, nasal discharge, oral lesions, sore throat, tinnitus or vertigo Allergy and Immunology ROS: negative for - hives or itchy/watery eyes Hematological and Lymphatic ROS: negative for - bleeding problems, bruising or swollen lymph nodes Endocrine ROS: negative for - galactorrhea, hair pattern changes, polydipsia/polyuria or temperature intolerance Respiratory ROS: negative for - cough, hemoptysis, shortness of breath or wheezing Cardiovascular ROS: negative for - chest pain, dyspnea on exertion, edema or irregular heartbeat Gastrointestinal ROS:  negative for - abdominal pain, diarrhea, hematemesis, nausea/vomiting or stool incontinence Genito-Urinary ROS: negative for - dysuria, hematuria, incontinence or urinary frequency/urgency Musculoskeletal ROS: negative for - joint swelling or muscular weakness Neurological ROS: as noted in HPI Dermatological ROS: negative for rash and skin lesion changes  Physical Examination: Blood pressure (!) 167/88, pulse 88, temperature 97.7 F (36.5 C), temperature source Oral, resp. rate 12, height 6\' 2"  (1.88 m), weight 117.9 kg, SpO2 98 %.   Neurological Examination   Mental Status: Alert, oriented, thought content appropriate.  Speech fluent without evidence of aphasia.  Able to follow 3 step commands without difficulty. Cranial Nerves: II: Discs flat bilaterally; Visual fields grossly normal, pupils equal, round, reactive to light and accommodation III,IV, VI: ptosis not present, extra-ocular motions intact bilaterally V,VII: smile symmetric, facial light touch sensation normal bilaterally VIII: hearing normal bilaterally IX,X: gag reflex present XI: bilateral shoulder shrug XII: midline tongue extension Motor: Right : Upper extremity   5/5    Left:     Upper extremity   5/5  Lower extremity   5/5     Lower extremity   5/5 Tone and bulk:normal tone throughout; no atrophy noted Sensory: Pinprick and light touch intact throughout, bilaterally Deep Tendon Reflexes: 2+ and symmetric throughout Plantars: Right: downgoing   Left: downgoing Cerebellar: L dysmetria  Gait: not tested.       Laboratory Studies:   Basic Metabolic Panel: Recent Labs  Lab 01/02/19 0938  NA 135  K 4.1  CL 99  CO2 25  GLUCOSE 177*  BUN 15  CREATININE 0.76  CALCIUM 9.2    Liver Function Tests: Recent Labs  Lab 01/02/19 0938  AST 23  ALT 27  ALKPHOS 99  BILITOT 0.8  PROT 7.9  ALBUMIN 4.2   Recent Labs  Lab 01/02/19 0938  LIPASE 22   No results for input(s): AMMONIA in the last 168  hours.  CBC: Recent Labs  Lab 01/02/19 0938  WBC 12.8*  HGB 16.1  HCT 46.2  MCV 90.1  PLT 289    Cardiac Enzymes: Recent Labs  Lab 01/02/19 0938  TROPONINI <0.03    BNP: Invalid input(s): POCBNP  CBG: No results for input(s): GLUCAP in the last 168 hours.  Microbiology: Results for orders placed or performed in visit on 12/19/18  Urine Culture     Status: None   Collection Time: 12/19/18  1:33 PM  Result Value Ref Range Status   MICRO NUMBER: [id number]  Final   SPECIMEN QUALITY: Adequate  Final   Sample Source NOT GIVEN  Final   STATUS: FINAL  Final   Result:   Final    Multiple organisms present, each less than 10,000 CFU/mL. These organisms, commonly found on external and internal genitalia, are considered to be colonizers. No further testing performed.    Coagulation Studies: Recent Labs    01/02/19 0943  LABPROT 12.6  INR 1.0    Urinalysis: No results for input(s): COLORURINE, LABSPEC, PHURINE, GLUCOSEU, HGBUR, BILIRUBINUR, KETONESUR, PROTEINUR, UROBILINOGEN, NITRITE, LEUKOCYTESUR in the last 168 hours.  Invalid input(s): APPERANCEUR  Lipid Panel:  No results found for: CHOL, TRIG, HDL, CHOLHDL, VLDL, LDLCALC  HgbA1C:  Lab Results  Component Value Date   HGBA1C 5.7 (H) 11/23/2016    Urine Drug Screen:  No results found for: LABOPIA, COCAINSCRNUR, LABBENZ, AMPHETMU, THCU, LABBARB  Alcohol Level: No results for input(s): ETH in the last 168 hours.  Other results: EKG: normal EKG, normal sinus rhythm, unchanged from previous tracings.  Imaging: Mr Brain Wo Contrast  Result Date: 01/02/2019 CLINICAL DATA:  Vertigo. EXAM: MRI HEAD WITHOUT CONTRAST TECHNIQUE: Multiplanar, multiecho pulse sequences of the brain and surrounding structures were obtained without intravenous contrast. COMPARISON:  None. FINDINGS: Brain: There is a moderate-sized acute left cerebellar infarct in the PICA territory. No intracranial hemorrhage, mass, midline shift, or  extra-axial fluid collection is identified. Scattered punctate foci of T2 hyperintensity in the cerebral white matter bilaterally are nonspecific but compatible with mild chronic small vessel ischemic disease. The ventricles and sulci are normal in size. Vascular: Abnormal appearance of the distal left vertebral artery which may reflect slow flow or occlusion versus volume averaging. Other major intracranial vascular flow voids are preserved. Skull and upper cervical spine: Unremarkable bone marrow signal. Sinuses/Orbits: Unremarkable orbits. Left ethmoid air cell mucosal thickening. Clear mastoid air cells. Other: None. IMPRESSION: 1. Acute left cerebellar infarct. 2. Abnormal appearance of the distal left vertebral artery, possibly slow flow or occlusion. Recommend head and neck CTA or MRA for further evaluation. 3. Mild chronic small vessel ischemic disease. Electronically Signed   By: Logan Bores M.D.   On: 01/02/2019 10:33     Assessment/Plan:   58 y.o. male smoking(dips), ETOH use, chronic back pain on muscle relaxants. Last known well at 11 PM last night. Presented with acute onset of vertigo, N/V.  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    No TPA as NIHSS of 1 for dysmetria and outside of window.   Admission ASA 81mg  and statin daily Pt/ot Positional vertigo. Trial of  meclizine if positional vertigo is still present. CTA pending to look if there is a need for dual anti plt therapy   Addendum. CTA left vertebral artery occlusion. But R vertebral dominant.  ASA to continue daily Trace Cederberg  01/02/2019, 12:13 PM

## 2019-01-02 NOTE — ED Notes (Signed)
Patient transported to CT 

## 2019-01-03 ENCOUNTER — Inpatient Hospital Stay (HOSPITAL_COMMUNITY)
Admit: 2019-01-03 | Discharge: 2019-01-03 | Disposition: A | Discharge status: Home / Self Care | Payer: BC Managed Care – PPO | Attending: Internal Medicine | Admitting: Internal Medicine

## 2019-01-03 DIAGNOSIS — I639 Cerebral infarction, unspecified: Secondary | ICD-10-CM

## 2019-01-03 LAB — HEMOGLOBIN A1C
Hgb A1c MFr Bld: 5.7 % — ABNORMAL HIGH (ref 4.8–5.6)
Mean Plasma Glucose: 116.89 mg/dL

## 2019-01-03 LAB — ECHOCARDIOGRAM COMPLETE
Height: 74 in
Weight: 4160 oz

## 2019-01-03 MED ORDER — ATORVASTATIN CALCIUM 40 MG PO TABS: 40.0000 mg | ORAL_TABLET | Freq: Every day | ORAL | 1 refills | Status: DC

## 2019-01-03 MED ORDER — ASPIRIN 325 MG PO TABS: 325.0000 mg | ORAL_TABLET | Freq: Every day | ORAL | 1 refills | Status: DC

## 2019-01-03 MED ORDER — MECLIZINE HCL 25 MG PO TABS: 25.0000 mg | ORAL_TABLET | Freq: Three times a day (TID) | ORAL | 0 refills | Status: DC | PRN

## 2019-01-03 MED ORDER — ATORVASTATIN CALCIUM 20 MG PO TABS: 40.0000 mg | ORAL_TABLET | Freq: Every day | ORAL | Status: DC

## 2019-01-03 NOTE — Evaluation (Signed)
Physical Therapy Evaluation Patient Details Name: Randy Watson MRN: [id number] DOB: [birth year] Today's Date: 01/03/2019   History of Present Illness  Pt is a 58 y.o. male presenting to hospital with emesis, dizziness, off-balance, and c/o room spinning.  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  PMH includes chronic back pain on muscle relaxants.  Clinical Impression  Prior to hospital admission, pt was independent with functional mobility.  Pt lives with his wife in 2 level home.  Currently pt is modified independent with bed mobility, independent with transfers, independent with ambulation (no AD), and modified independent navigating a flight of stairs with railing.  Pt reporting very mild "dizziness" symptoms with head turns (but none if just looking around with eyes with head in neutral position).  Pt with very mild loss of balance looking R initially with head turn R but pt able to easily self correct and no loss of balance noted on 2nd trial during ambulation.  Pt educated on focusing on object with ambulation (avoiding significant or quick head or eye movements) if pt starts feeling symptomatic: pt able to verbalize and demonstrate appropriate understanding during session.  Pt mobilizing well with functional mobility and demonstrates appropriate safety.  Will discharge pt from PT in house: please re-consult PT if pt's status changes and acute PT needs are identified.    Follow Up Recommendations No PT follow up    Equipment Recommendations  None recommended by PT    Recommendations for Other Services       Precautions / Restrictions Precautions Precautions: Fall Restrictions Weight Bearing Restrictions: No      Mobility  Bed Mobility Overal bed mobility: Modified Independent             General bed mobility comments: HOB elevated; no difficulties noted semi-supine to/from  sit  Transfers Overall transfer level: Independent Equipment used: None             General transfer comment: steady safe transfers noted from bed and pt reporting no difficulties from toilet  Ambulation/Gait Ambulation/Gait assistance: Independent Gait Distance (Feet): 300 Feet Assistive device: None Gait Pattern/deviations: WFL(Within Functional Limits)   Gait velocity interpretation: >4.37 ft/sec, indicative of normal walking speed General Gait Details: steady ambulation noted  Stairs Stairs: Yes Stairs assistance: Modified independent (Device/Increase time) Stair Management: One rail Left;Alternating pattern;Forwards Number of Stairs: 12 General stair comments: steady safe stairs navigation noted  Wheelchair Mobility    Modified Rankin (Stroke Patients Only)       Balance Overall balance assessment: Modified Independent Sitting-balance support: No upper extremity supported;Feet supported Sitting balance-Leahy Scale: Normal Sitting balance - Comments: steady sitting reaching outside BOS   Standing balance support: No upper extremity supported;During functional activity Standing balance-Leahy Scale: Good Standing balance comment: No loss of balance with ambulation and looking R/L/up/down with eyes only; very mild loss of balance looking R initially with head turn R--pt able to easily self correct (but no loss of balance 2nd trial) during ambulation; no loss of balance with looking L/up/down (with head turns to each respective direction) during ambulation                             Pertinent Vitals/Pain Pain Assessment: No/denies pain  Vitals (HR and O2 on room air) stable and WFL throughout treatment session.    Home Living Family/patient expects to be discharged to:: Private residence Living  Arrangements: Spouse/significant other Available Help at Discharge: Family Type of Home: House Home Access: Ramped entrance     Home Layout: Two  level;Bed/bath upstairs;Able to live on main level with bedroom/bathroom(Can sleep on main level) Home Equipment: None;Shower seat - built in;Grab bars - tub/shower      Prior Function Level of Independence: Independent         Comments: High school sports Scientist, research (physical sciences) Dominance        Extremity/Trunk Assessment   Upper Extremity Assessment Upper Extremity Assessment: Overall WFL for tasks assessed;Defer to OT evaluation    Lower Extremity Assessment Lower Extremity Assessment: (Intact B LE sensation, proprioception, heel to shin coordination.  3 beat L ankle clonus noted; catch noted R ankle; 5/5 B LE hip flexion, knee flexion/extension, and DF strength)    Cervical / Trunk Assessment Cervical / Trunk Assessment: Normal  Communication   Communication: No difficulties  Cognition Arousal/Alertness: Awake/alert Behavior During Therapy: WFL for tasks assessed/performed Overall Cognitive Status: Within Functional Limits for tasks assessed                                        General Comments   Nursing cleared pt for participation in physical therapy.  Pt agreeable to PT session.    Exercises     Assessment/Plan    PT Assessment Patent does not need any further PT services  PT Problem List         PT Treatment Interventions      PT Goals (Current goals can be found in the Care Plan section)  Acute Rehab PT Goals Patient Stated Goal: to go home PT Goal Formulation: With patient Time For Goal Achievement: 01/17/19 Potential to Achieve Goals: Good    Frequency     Barriers to discharge        Co-evaluation               AM-PAC PT "6 Clicks" Mobility  Outcome Measure Help needed turning from your back to your side while in a flat bed without using bedrails?: None Help needed moving from lying on your back to sitting on the side of a flat bed without using bedrails?: None Help needed moving to and from a bed to a chair  (including a wheelchair)?: None Help needed standing up from a chair using your arms (e.g., wheelchair or bedside chair)?: None Help needed to walk in hospital room?: None Help needed climbing 3-5 steps with a railing? : None 6 Click Score: 24    End of Session Equipment Utilized During Treatment: Gait belt Activity Tolerance: Patient tolerated treatment well Patient left: in bed;with call bell/phone within reach Nurse Communication: Mobility status;Precautions PT Visit Diagnosis: Dizziness and giddiness (R42)    Time: 1112-1140 PT Time Calculation (min) (ACUTE ONLY): 28 min   Charges:   PT Evaluation $PT Eval Low Complexity: 1 Low PT Treatments $Therapeutic Activity: 8-22 mins       Leitha Bleak, PT 01/03/19, 12:08 PM [phone]

## 2019-01-03 NOTE — Discharge Summary (Signed)
[facility] [place] NAME: Randy Watson    MR#:  [id number]  DATE OF BIRTH:  09/22/1960  DATE OF ADMISSION:  01/02/2019 ADMITTING PHYSICIAN: Fritzi Mandes, MD  DATE OF DISCHARGE: 01/03/2019  PRIMARY CARE PHYSICIAN: Randy College, NP    ADMISSION DIAGNOSIS:  Acute embolic stroke [facility]) [Z16.9] Acute CVA (cerebrovascular accident) ([place]) [I63.9]  DISCHARGE DIAGNOSIS:  Acute left Cerebellar CVA Hyperlipidemia  SECONDARY DIAGNOSIS:   Past Medical History:  Diagnosis Date  . Allergy   . Anal fissure   . Constipation   . GERD (gastroesophageal reflux disease)   . History of chicken pox   . Obesity     HOSPITAL COURSE:  MichaelWilliamsis a58 y.o.malewith a known history of acid reflux comes to the emergency room after he experienced sudden onset dizziness at 11 o'clock last night. He also felt off balance. Patient felt the room was spinning got nauseated and vomited in the ER.  1.Acute left cerebellar stroke -came in with sudden onset nausea and dizziness and off balance. -CT angiogram head and neck shows left vertebral artery occlusion origin. -CT neck no significant stenosis -seen by neurology Dr. Irish Elders -PT OT speech to see -aspirin, statins -allow permissive hypertension--BP stable -echo of the heart today -prn meclizine  2.Randy Watson continue Protonix  3.Hyperlipediemia -discussed lipid profile with pt along with diet -started on statins  4.DVT prophylaxis subcu Lovenox  CONSULTS OBTAINED:  Treatment Team:  Leotis Pain, MD  DRUG ALLERGIES:   Allergies  Allergen Reactions  . Penicillins     DISCHARGE MEDICATIONS:   Allergies as of 01/03/2019      Reactions   Penicillins       Medication List    STOP taking these medications   ibuprofen 200 MG tablet Commonly known as:  ADVIL     TAKE these medications   aspirin 325 MG tablet Take 1 tablet (325 mg total) by mouth  daily. Start taking on:  Jan 04, 2019   atorvastatin 40 MG tablet Commonly known as:  LIPITOR Take 1 tablet (40 mg total) by mouth daily at 6 PM.   cyclobenzaprine 10 MG tablet Commonly known as:  FLEXERIL Take 0.5-1 tablets (5-10 mg total) by mouth 3 (three) times daily as needed for muscle spasms.   meclizine 25 MG tablet Commonly known as:  ANTIVERT Take 1 tablet (25 mg total) by mouth 3 (three) times daily as needed for dizziness.       If you experience worsening of your admission symptoms, develop shortness of breath, life threatening emergency, suicidal or homicidal thoughts you must seek medical attention immediately by calling 911 or calling your MD immediately  if symptoms less severe.  You Must read complete instructions/literature along with all the possible adverse reactions/side effects for all the Medicines you take and that have been prescribed to you. Take any new Medicines after you have completely understood and accept all the possible adverse reactions/side effects.   Please note  You were cared for by a hospitalist during your hospital stay. If you have any questions about your discharge medications or the care you received while you were in the hospital after you are discharged, you can call the unit and asked to speak with the hospitalist on call if the hospitalist that took care of you is not available. Once you are discharged, your primary care physician will handle any further medical issues. Please note that NO REFILLS for any discharge medications will be  authorized once you are discharged, as it is imperative that you return to your primary care physician (or establish a relationship with a primary care physician if you do not have one) for your aftercare needs so that they can reassess your need for medications and monitor your lab values. Today   SUBJECTIVE   Feels better today. Symptoms of dizziness improvng  VITAL SIGNS:  Blood pressure (!) 143/90,  pulse 90, temperature 98.3 F (36.8 C), temperature source Oral, resp. rate 20, height 6\' 2"  (1.88 m), weight 117.9 kg, SpO2 96 %.  I/O:    Intake/Output Summary (Last 24 hours) at 01/03/2019 1217 Last data filed at 01/03/2019 1010 Gross per 24 hour  Intake 240 ml  Output -  Net 240 ml    PHYSICAL EXAMINATION:  GENERAL:  58 y.o.-year-old patient lying in the bed with no acute distress.  EYES: Pupils equal, round, reactive to light and accommodation. No scleral icterus. Extraocular muscles intact.  HEENT: Head atraumatic, normocephalic. Oropharynx and nasopharynx clear.  NECK:  Supple, no jugular venous distention. No thyroid enlargement, no tenderness.  LUNGS: Normal breath sounds bilaterally, no wheezing, rales,rhonchi or crepitation. No use of accessory muscles of respiration.  CARDIOVASCULAR: S1, S2 normal. No murmurs, rubs, or gallops.  ABDOMEN: Soft, non-tender, non-distended. Bowel sounds present. No organomegaly or mass.  EXTREMITIES: No pedal edema, cyanosis, or clubbing.  NEUROLOGIC: Cranial nerves II through XII are intact. Muscle strength 5/5 in all extremities. Sensation intact. Gait not checked.  PSYCHIATRIC: The patient is alert and oriented x 3.  SKIN: No obvious rash, lesion, or ulcer.   DATA REVIEW:   CBC  Recent Labs  Lab 01/02/19 0938  WBC 12.8*  HGB 16.1  HCT 46.2  PLT 289    Chemistries  Recent Labs  Lab 01/02/19 0938  NA 135  K 4.1  CL 99  CO2 25  GLUCOSE 177*  BUN 15  CREATININE 0.76  CALCIUM 9.2  AST 23  ALT 27  ALKPHOS 99  BILITOT 0.8    Microbiology Results   Recent Results (from the past 240 hour(s))  SARS Coronavirus 2 (CEPHEID - Performed in [facility] hospital lab), Hosp Order     Status: None   Collection Time: 01/02/19 12:07 PM  Result Value Ref Range Status   SARS Coronavirus 2 NEGATIVE NEGATIVE Final    Comment: (NOTE) If result is NEGATIVE SARS-CoV-2 target nucleic acids are NOT DETECTED. The SARS-CoV-2 RNA is  generally detectable in upper and lower  respiratory specimens during the acute phase of infection. The lowest  concentration of SARS-CoV-2 viral copies this assay can detect is 250  copies / mL. A negative result does not preclude SARS-CoV-2 infection  and should not be used as the sole basis for treatment or other  patient management decisions.  A negative result may occur with  improper specimen collection / handling, submission of specimen other  than nasopharyngeal swab, presence of viral mutation(s) within the  areas targeted by this assay, and inadequate number of viral copies  (<250 copies / mL). A negative result must be combined with clinical  observations, patient history, and epidemiological information. If result is POSITIVE SARS-CoV-2 target nucleic acids are DETECTED. The SARS-CoV-2 RNA is generally detectable in upper and lower  respiratory specimens dur ing the acute phase of infection.  Positive  results are indicative of active infection with SARS-CoV-2.  Clinical  correlation with patient history and other diagnostic information is  necessary to determine patient infection status.  Positive results do  not rule out bacterial infection or co-infection with other viruses. If result is PRESUMPTIVE POSTIVE SARS-CoV-2 nucleic acids MAY BE PRESENT.   A presumptive positive result was obtained on the submitted specimen  and confirmed on repeat testing.  While 2019 novel coronavirus  (SARS-CoV-2) nucleic acids may be present in the submitted sample  additional confirmatory testing may be necessary for epidemiological  and / or clinical management purposes  to differentiate between  SARS-CoV-2 and other Sarbecovirus currently known to infect humans.  If clinically indicated additional testing with an alternate test  methodology [phone]) is advised. The SARS-CoV-2 RNA is generally  detectable in upper and lower respiratory sp ecimens during the acute  phase of  infection. The expected result is Negative. Fact Sheet for Patients:  StrictlyIdeas.no Fact Sheet for Healthcare Providers: BankingDealers.co.za This test is not yet approved or cleared by the [place] FDA and has been authorized for detection and/or diagnosis of SARS-CoV-2 by FDA under an Emergency Use Authorization (EUA).  This EUA will remain in effect (meaning this test can be used) for the duration of the COVID-19 declaration under Section 564(b)(1) of the Act, 21 U.S.C. section 360bbb-3(b)(1), unless the authorization is terminated or revoked sooner. Performed at [facility], [place], [place] [address]     RADIOLOGY:  Ct Angio Head W Or Wo Contrast  Result Date: 01/02/2019 CLINICAL DATA:  Dizziness, imbalance, nausea, and vomiting since last night. EXAM: CT ANGIOGRAPHY HEAD AND NECK TECHNIQUE: Multidetector CT imaging of the head and neck was performed using the standard protocol during bolus administration of intravenous contrast. Multiplanar CT image reconstructions and MIPs were obtained to evaluate the vascular anatomy. Carotid stenosis measurements (when applicable) are obtained utilizing NASCET criteria, using the distal internal carotid diameter as the denominator. CONTRAST:  63mL OMNIPAQUE IOHEXOL 350 MG/ML SOLN COMPARISON:  Brain MRI 01/02/2019 FINDINGS: CT HEAD FINDINGS Brain: Left cerebellar hypoattenuation corresponds to the acute infarct on today's MRI. No acute intracranial hemorrhage, mass, midline shift, or extra-axial fluid collection is identified. The ventricles and sulci are normal. Vascular: Calcified atherosclerosis at the skull base. Skull: No fracture or focal osseous lesion. Sinuses: Left ethmoid air cell opacification. Clear mastoid air cells. Orbits: Unremarkable. Review of the MIP images confirms the above findings CTA NECK FINDINGS Aortic arch: Normal variant aortic arch branching  pattern with common origin of the brachiocephalic and left common carotid arteries. Widely patent arch vessel origins. Right carotid system: Patent with mild calcified plaque at the carotid bifurcation. No evidence of dissection or stenosis. Left carotid system: Patent with small volume soft plaque in the mid common carotid artery and at the carotid bifurcation. No evidence of dissection or stenosis. Vertebral arteries: The right vertebral artery is patent and robust in size without evidence of stenosis or dissection. The left vertebral artery is occluded at its origin with partial reconstitution of thready V2 and V3 segments. Skeleton: Mild cervical spondylosis. Other neck: Mild diffuse enlargement, heterogeneity, and nodularity of the thyroid gland with suggestion of an approximately 2 cm nodule in the isthmus. Upper chest: Clear lung apices. Review of the MIP images confirms the above findings CTA HEAD FINDINGS Anterior circulation: The internal carotid arteries are patent from skull base to carotid termini with mild atherosclerotic plaque bilaterally not resulting in significant stenosis. ACAs and MCAs are patent without evidence of proximal branch occlusion or significant proximal stenosis. No aneurysm is identified. Posterior circulation: The intracranial right vertebral artery is widely patent. The  left vertebral artery is occluded near the V3-V4 junction with opacification of a small V4 segment more distally. Patent right PICA, bilateral AICA, and bilateral SCA origins are identified. A left PICA is not identified. The basilar artery is widely patent. There is a small left posterior communicating artery. Both PCAs are patent without evidence of significant proximal stenosis. No aneurysm is identified. Venous sinuses: Patent. Anatomic variants: None. Review of the MIP images confirms the above findings IMPRESSION: 1. 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 2.  Widely patent, robust right vertebral artery. 3. Mild intracranial atherosclerosis without significant anterior circulation stenosis. 4. Widely patent cervical carotid arteries. 5. Multinodular thyroid gland. Nonemergent thyroid ultrasound is recommended for further evaluation. These results were called by telephone at the time of interpretation on 01/02/2019 at 12:10 pm to Dr. Delman Kitten , who verbally acknowledged these results. Electronically Signed   By: Logan Bores M.D.   On: 01/02/2019 12:23   Ct Angio Neck W And/or Wo Contrast  Result Date: 01/02/2019 CLINICAL DATA:  Dizziness, imbalance, nausea, and vomiting since last night. EXAM: CT ANGIOGRAPHY HEAD AND NECK TECHNIQUE: Multidetector CT imaging of the head and neck was performed using the standard protocol during bolus administration of intravenous contrast. Multiplanar CT image reconstructions and MIPs were obtained to evaluate the vascular anatomy. Carotid stenosis measurements (when applicable) are obtained utilizing NASCET criteria, using the distal internal carotid diameter as the denominator. CONTRAST:  49mL OMNIPAQUE IOHEXOL 350 MG/ML SOLN COMPARISON:  Brain MRI 01/02/2019 FINDINGS: CT HEAD FINDINGS Brain: Left cerebellar hypoattenuation corresponds to the acute infarct on today's MRI. No acute intracranial hemorrhage, mass, midline shift, or extra-axial fluid collection is identified. The ventricles and sulci are normal. Vascular: Calcified atherosclerosis at the skull base. Skull: No fracture or focal osseous lesion. Sinuses: Left ethmoid air cell opacification. Clear mastoid air cells. Orbits: Unremarkable. Review of the MIP images confirms the above findings CTA NECK FINDINGS Aortic arch: Normal variant aortic arch branching pattern with common origin of the brachiocephalic and left common carotid arteries. Widely patent arch vessel origins. Right carotid system: Patent with mild calcified plaque at the carotid bifurcation. No evidence of  dissection or stenosis. Left carotid system: Patent with small volume soft plaque in the mid common carotid artery and at the carotid bifurcation. No evidence of dissection or stenosis. Vertebral arteries: The right vertebral artery is patent and robust in size without evidence of stenosis or dissection. The left vertebral artery is occluded at its origin with partial reconstitution of thready V2 and V3 segments. Skeleton: Mild cervical spondylosis. Other neck: Mild diffuse enlargement, heterogeneity, and nodularity of the thyroid gland with suggestion of an approximately 2 cm nodule in the isthmus. Upper chest: Clear lung apices. Review of the MIP images confirms the above findings CTA HEAD FINDINGS Anterior circulation: The internal carotid arteries are patent from skull base to carotid termini with mild atherosclerotic plaque bilaterally not resulting in significant stenosis. ACAs and MCAs are patent without evidence of proximal branch occlusion or significant proximal stenosis. No aneurysm is identified. Posterior circulation: The intracranial right vertebral artery is widely patent. The left vertebral artery is occluded near the V3-V4 junction with opacification of a small V4 segment more distally. Patent right PICA, bilateral AICA, and bilateral SCA origins are identified. A left PICA is not identified. The basilar artery is widely patent. There is a small left posterior communicating artery. Both PCAs are patent without evidence of significant proximal stenosis. No aneurysm is  identified. Venous sinuses: Patent. Anatomic variants: None. Review of the MIP images confirms the above findings IMPRESSION: 1. 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 2. Widely patent, robust right vertebral artery. 3. Mild intracranial atherosclerosis without significant anterior circulation stenosis. 4. Widely patent cervical carotid arteries. 5. Multinodular thyroid gland. Nonemergent  thyroid ultrasound is recommended for further evaluation. These results were called by telephone at the time of interpretation on 01/02/2019 at 12:10 pm to Dr. Delman Kitten , who verbally acknowledged these results. Electronically Signed   By: Logan Bores M.D.   On: 01/02/2019 12:23   Mr Brain Wo Contrast  Result Date: 01/02/2019 CLINICAL DATA:  Vertigo. EXAM: MRI HEAD WITHOUT CONTRAST TECHNIQUE: Multiplanar, multiecho pulse sequences of the brain and surrounding structures were obtained without intravenous contrast. COMPARISON:  None. FINDINGS: Brain: There is a moderate-sized acute left cerebellar infarct in the PICA territory. No intracranial hemorrhage, mass, midline shift, or extra-axial fluid collection is identified. Scattered punctate foci of T2 hyperintensity in the cerebral white matter bilaterally are nonspecific but compatible with mild chronic small vessel ischemic disease. The ventricles and sulci are normal in size. Vascular: Abnormal appearance of the distal left vertebral artery which may reflect slow flow or occlusion versus volume averaging. Other major intracranial vascular flow voids are preserved. Skull and upper cervical spine: Unremarkable bone marrow signal. Sinuses/Orbits: Unremarkable orbits. Left ethmoid air cell mucosal thickening. Clear mastoid air cells. Other: None. IMPRESSION: 1. Acute left cerebellar infarct. 2. Abnormal appearance of the distal left vertebral artery, possibly slow flow or occlusion. Recommend head and neck CTA or MRA for further evaluation. 3. Mild chronic small vessel ischemic disease. Electronically Signed   By: Logan Bores M.D.   On: 01/02/2019 10:33     CODE STATUS:     Code Status Orders  (From admission, onward)         Start     Ordered   01/02/19 1337  Full code  Continuous     01/02/19 1336        Code Status History    This patient has a current code status but no historical code status.      TOTAL TIME TAKING CARE OF THIS  PATIENT: *40* minutes.    Fritzi Mandes M.D on 01/03/2019 at 12:17 PM  Between 7am to 6pm - Pager - [phone] After 6pm go to [email or web address] - password [email or web address] [facility]  Office  [phone]  CC: Primary care physician; Randy College, NP

## 2019-01-03 NOTE — Progress Notes (Signed)
[facility] NAME: Randy Watson    MR#:  [id number]  DATE OF BIRTH:  1961-06-15  SUBJECTIVE:  patient feels better today. Has been tolerating meclizine well. No nausea vomiting this morning. Slept well overall  REVIEW OF SYSTEMS:   Review of Systems  Constitutional: Negative for chills, fever and weight loss.  HENT: Negative for ear discharge, ear pain and nosebleeds.   Eyes: Negative for blurred vision, pain and discharge.  Respiratory: Negative for sputum production, shortness of breath, wheezing and stridor.   Cardiovascular: Negative for chest pain, palpitations, orthopnea and PND.  Gastrointestinal: Negative for abdominal pain, diarrhea, nausea and vomiting.  Genitourinary: Negative for frequency and urgency.  Musculoskeletal: Negative for back pain and joint pain.  Neurological: Positive for dizziness and weakness. Negative for sensory change, speech change and focal weakness.  Psychiatric/Behavioral: Negative for depression and hallucinations. The patient is not nervous/anxious.    Tolerating Diet:yes Tolerating PT: pending  DRUG ALLERGIES:   Allergies  Allergen Reactions  . Penicillins     VITALS:  Blood pressure (!) 103/56, pulse 93, temperature (!) 97.4 F (36.3 C), temperature source Oral, resp. rate 18, height 6\' 2"  (1.88 m), weight 117.9 kg, SpO2 93 %.  PHYSICAL EXAMINATION:   Physical Exam  GENERAL:  58 y.o.-year-old patient lying in the bed with no acute distress.  EYES: Pupils equal, round, reactive to light and accommodation. No scleral icterus. Extraocular muscles intact.  HEENT: Head atraumatic, normocephalic. Oropharynx and nasopharynx clear.  NECK:  Supple, no jugular venous distention. No thyroid enlargement, no tenderness.  LUNGS: Normal breath sounds bilaterally, no wheezing, rales, rhonchi. No use of accessory muscles of respiration.  CARDIOVASCULAR: S1, S2 normal. No murmurs, rubs, or  gallops.  ABDOMEN: Soft, nontender, nondistended. Bowel sounds present. No organomegaly or mass.  EXTREMITIES: No cyanosis, clubbing or edema b/l.    NEUROLOGIC: Cranial nerves II through XII are intact. No focal Motor or sensory deficits b/l.   PSYCHIATRIC:  patient is alert and oriented x 3.  SKIN: No obvious rash, lesion, or ulcer.   LABORATORY PANEL:  CBC Recent Labs  Lab 01/02/19 0938  WBC 12.8*  HGB 16.1  HCT 46.2  PLT 289    Chemistries  Recent Labs  Lab 01/02/19 0938  NA 135  K 4.1  CL 99  CO2 25  GLUCOSE 177*  BUN 15  CREATININE 0.76  CALCIUM 9.2  AST 23  ALT 27  ALKPHOS 99  BILITOT 0.8   Cardiac Enzymes Recent Labs  Lab 01/02/19 0938  TROPONINI <0.03   RADIOLOGY:  Ct Angio Head W Or Wo Contrast  Result Date: 01/02/2019 CLINICAL DATA:  Dizziness, imbalance, nausea, and vomiting since last night. EXAM: CT ANGIOGRAPHY HEAD AND NECK TECHNIQUE: Multidetector CT imaging of the head and neck was performed using the standard protocol during bolus administration of intravenous contrast. Multiplanar CT image reconstructions and MIPs were obtained to evaluate the vascular anatomy. Carotid stenosis measurements (when applicable) are obtained utilizing NASCET criteria, using the distal internal carotid diameter as the denominator. CONTRAST:  69mL OMNIPAQUE IOHEXOL 350 MG/ML SOLN COMPARISON:  Brain MRI 01/02/2019 FINDINGS: CT HEAD FINDINGS Brain: Left cerebellar hypoattenuation corresponds to the acute infarct on today's MRI. No acute intracranial hemorrhage, mass, midline shift, or extra-axial fluid collection is identified. The ventricles and sulci are normal. Vascular: Calcified atherosclerosis at the skull base. Skull: No fracture or focal osseous lesion. Sinuses: Left ethmoid air cell opacification. Clear mastoid  air cells. Orbits: Unremarkable. Review of the MIP images confirms the above findings CTA NECK FINDINGS Aortic arch: Normal variant aortic arch branching pattern  with common origin of the brachiocephalic and left common carotid arteries. Widely patent arch vessel origins. Right carotid system: Patent with mild calcified plaque at the carotid bifurcation. No evidence of dissection or stenosis. Left carotid system: Patent with small volume soft plaque in the mid common carotid artery and at the carotid bifurcation. No evidence of dissection or stenosis. Vertebral arteries: The right vertebral artery is patent and robust in size without evidence of stenosis or dissection. The left vertebral artery is occluded at its origin with partial reconstitution of thready V2 and V3 segments. Skeleton: Mild cervical spondylosis. Other neck: Mild diffuse enlargement, heterogeneity, and nodularity of the thyroid gland with suggestion of an approximately 2 cm nodule in the isthmus. Upper chest: Clear lung apices. Review of the MIP images confirms the above findings CTA HEAD FINDINGS Anterior circulation: The internal carotid arteries are patent from skull base to carotid termini with mild atherosclerotic plaque bilaterally not resulting in significant stenosis. ACAs and MCAs are patent without evidence of proximal branch occlusion or significant proximal stenosis. No aneurysm is identified. Posterior circulation: The intracranial right vertebral artery is widely patent. The left vertebral artery is occluded near the V3-V4 junction with opacification of a small V4 segment more distally. Patent right PICA, bilateral AICA, and bilateral SCA origins are identified. A left PICA is not identified. The basilar artery is widely patent. There is a small left posterior communicating artery. Both PCAs are patent without evidence of significant proximal stenosis. No aneurysm is identified. Venous sinuses: Patent. Anatomic variants: None. Review of the MIP images confirms the above findings IMPRESSION: 1. 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 2. Widely  patent, robust right vertebral artery. 3. Mild intracranial atherosclerosis without significant anterior circulation stenosis. 4. Widely patent cervical carotid arteries. 5. Multinodular thyroid gland. Nonemergent thyroid ultrasound is recommended for further evaluation. These results were called by telephone at the time of interpretation on 01/02/2019 at 12:10 pm to Dr. Delman Kitten , who verbally acknowledged these results. Electronically Signed   By: Logan Bores M.D.   On: 01/02/2019 12:23   Ct Angio Neck W And/or Wo Contrast  Result Date: 01/02/2019 CLINICAL DATA:  Dizziness, imbalance, nausea, and vomiting since last night. EXAM: CT ANGIOGRAPHY HEAD AND NECK TECHNIQUE: Multidetector CT imaging of the head and neck was performed using the standard protocol during bolus administration of intravenous contrast. Multiplanar CT image reconstructions and MIPs were obtained to evaluate the vascular anatomy. Carotid stenosis measurements (when applicable) are obtained utilizing NASCET criteria, using the distal internal carotid diameter as the denominator. CONTRAST:  71mL OMNIPAQUE IOHEXOL 350 MG/ML SOLN COMPARISON:  Brain MRI 01/02/2019 FINDINGS: CT HEAD FINDINGS Brain: Left cerebellar hypoattenuation corresponds to the acute infarct on today's MRI. No acute intracranial hemorrhage, mass, midline shift, or extra-axial fluid collection is identified. The ventricles and sulci are normal. Vascular: Calcified atherosclerosis at the skull base. Skull: No fracture or focal osseous lesion. Sinuses: Left ethmoid air cell opacification. Clear mastoid air cells. Orbits: Unremarkable. Review of the MIP images confirms the above findings CTA NECK FINDINGS Aortic arch: Normal variant aortic arch branching pattern with common origin of the brachiocephalic and left common carotid arteries. Widely patent arch vessel origins. Right carotid system: Patent with mild calcified plaque at the carotid bifurcation. No evidence of dissection  or stenosis. Left carotid system:  Patent with small volume soft plaque in the mid common carotid artery and at the carotid bifurcation. No evidence of dissection or stenosis. Vertebral arteries: The right vertebral artery is patent and robust in size without evidence of stenosis or dissection. The left vertebral artery is occluded at its origin with partial reconstitution of thready V2 and V3 segments. Skeleton: Mild cervical spondylosis. Other neck: Mild diffuse enlargement, heterogeneity, and nodularity of the thyroid gland with suggestion of an approximately 2 cm nodule in the isthmus. Upper chest: Clear lung apices. Review of the MIP images confirms the above findings CTA HEAD FINDINGS Anterior circulation: The internal carotid arteries are patent from skull base to carotid termini with mild atherosclerotic plaque bilaterally not resulting in significant stenosis. ACAs and MCAs are patent without evidence of proximal branch occlusion or significant proximal stenosis. No aneurysm is identified. Posterior circulation: The intracranial right vertebral artery is widely patent. The left vertebral artery is occluded near the V3-V4 junction with opacification of a small V4 segment more distally. Patent right PICA, bilateral AICA, and bilateral SCA origins are identified. A left PICA is not identified. The basilar artery is widely patent. There is a small left posterior communicating artery. Both PCAs are patent without evidence of significant proximal stenosis. No aneurysm is identified. Venous sinuses: Patent. Anatomic variants: None. Review of the MIP images confirms the above findings IMPRESSION: 1. 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 2. Widely patent, robust right vertebral artery. 3. Mild intracranial atherosclerosis without significant anterior circulation stenosis. 4. Widely patent cervical carotid arteries. 5. Multinodular thyroid gland. Nonemergent thyroid  ultrasound is recommended for further evaluation. These results were called by telephone at the time of interpretation on 01/02/2019 at 12:10 pm to Dr. Delman Kitten , who verbally acknowledged these results. Electronically Signed   By: Logan Bores M.D.   On: 01/02/2019 12:23   Mr Brain Wo Contrast  Result Date: 01/02/2019 CLINICAL DATA:  Vertigo. EXAM: MRI HEAD WITHOUT CONTRAST TECHNIQUE: Multiplanar, multiecho pulse sequences of the brain and surrounding structures were obtained without intravenous contrast. COMPARISON:  None. FINDINGS: Brain: There is a moderate-sized acute left cerebellar infarct in the PICA territory. No intracranial hemorrhage, mass, midline shift, or extra-axial fluid collection is identified. Scattered punctate foci of T2 hyperintensity in the cerebral white matter bilaterally are nonspecific but compatible with mild chronic small vessel ischemic disease. The ventricles and sulci are normal in size. Vascular: Abnormal appearance of the distal left vertebral artery which may reflect slow flow or occlusion versus volume averaging. Other major intracranial vascular flow voids are preserved. Skull and upper cervical spine: Unremarkable bone marrow signal. Sinuses/Orbits: Unremarkable orbits. Left ethmoid air cell mucosal thickening. Clear mastoid air cells. Other: None. IMPRESSION: 1. Acute left cerebellar infarct. 2. Abnormal appearance of the distal left vertebral artery, possibly slow flow or occlusion. Recommend head and neck CTA or MRA for further evaluation. 3. Mild chronic small vessel ischemic disease. Electronically Signed   By: Logan Bores M.D.   On: 01/02/2019 10:33   ASSESSMENT AND PLAN:  Staley Lunz  is a 58 y.o. male with a known history of acid reflux comes to the emergency room after he experienced sudden onset dizziness at 11 o'clock last night. He also felt off balance. Patient felt the room was spinning got nauseated and vomited in the ER.   1. Acute left  cerebellar stroke -came in with sudden onset nausea and dizziness and off balance. -CT angiogram head and neck shows  left vertebral artery occlusion origin. -CT neck no significant stenosis -seen by neurology Dr. Irish Elders -PT OT speech to see -aspirin, statins -allow permissive hypertension--BP stable -echo of the heart today -prn meclizine  2. [name] continue Protonix  3.Hyperlipediemia -discussed lipid profile with pt along with diet -started on statins  4. DVT prophylaxis subcu Lovenox  If continues to improve pt can discharge home later today. Pt agreeable  CODE STATUS: full  TOTAL TIME TAKING CARE OF THIS PATIENT: *40* minutes.  >50% time spent on counselling and coordination of care  POSSIBLE D/C  TODAY DEPENDING ON CLINICAL CONDITION.  Note: This dictation was prepared with Dragon dictation along with smaller phrase technology. Any transcriptional errors that result from this process are unintentional.  Fritzi Mandes M.D on 01/03/2019 at 8:41 AM  Between 7am to 6pm - Pager - [phone]  After 6pm go to [email or web address] - password [email or web address] [facility]  Office  [phone]  CC: Primary care physician; Mikey College, NPPatient ID: Hyman Hopes, male   DOB: Dec 29, 1960, 58 y.o.   MRN: [id number]

## 2019-01-03 NOTE — Plan of Care (Signed)
  Problem: Education: Goal: Knowledge of disease or condition will improve Outcome: Progressing Goal: Knowledge of secondary prevention will improve Outcome: Progressing Goal: Knowledge of patient specific risk factors addressed and post discharge goals established will improve Outcome: Progressing   Problem: Coping: Goal: Will verbalize positive feelings about self Outcome: Progressing Goal: Will identify appropriate support needs Outcome: Progressing   Problem: Ischemic Stroke/TIA Tissue Perfusion: Goal: Complications of ischemic stroke/TIA will be minimized Outcome: Progressing   Problem: Education: Goal: Knowledge of General Education information will improve Description Including pain rating scale, medication(s)/side effects and non-pharmacologic comfort measures Outcome: Progressing   Problem: Clinical Measurements: Goal: Ability to maintain clinical measurements within normal limits will improve Outcome: Progressing Goal: Will remain free from infection Outcome: Progressing Goal: Diagnostic test results will improve Outcome: Progressing Goal: Respiratory complications will improve Outcome: Progressing Goal: Cardiovascular complication will be avoided Outcome: Progressing   Problem: Pain Managment: Goal: General experience of comfort will improve Outcome: Progressing   Problem: Safety: Goal: Ability to remain free from injury will improve Outcome: Progressing

## 2019-01-03 NOTE — Evaluation (Signed)
Occupational Therapy Evaluation Patient Details Name: Randy Watson MRN: [id number] DOB: [birth year] Today's Date: 01/03/2019    History of Present Illness Pt is a 58 y.o. male presenting to hospital with emesis, dizziness, off-balance, and c/o room spinning.  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  PMH includes chronic back pain on muscle relaxants.   Clinical Impression   Pt is 58 year old male who presents with new onset of dizziness, emesis and vertigo.  Imaging indicated he has an acute L cerebellar infarct with L vertebral artery occlusion.  Pt  presents with eagerness to regain independence and does not have any vertigo, dizziness or emesis and feels that he is back to normal but had minimal difficulty with LB dressing and educated in use of reacher and sock aid with rec for LH shoe horn and elastic shoe laces to increase independence and decrease strain on lower back. Pt also educated in how to order items.  Pt does not present with any strength, coordination or vision changes as a result of the L cerebellar infarct and was able to safety go to the bathroom and complete hygiene without any AD for ambulation.  No further OT needs but rec vestibular rehab with PT as outpatient if symptoms return or persist with dizziness and vertigo. Pt seen for OT evaluation only.    Follow Up Recommendations  No OT follow up    Equipment Recommendations  Other (comment)(reacher, LH shoe horn. elastic shoe laces and sock aid rec)    Recommendations for Other Services       Precautions / Restrictions Precautions Precautions: Fall Restrictions Weight Bearing Restrictions: No      Mobility Bed Mobility Overal bed mobility: Modified Independent             General bed mobility comments: HOB elevated; no difficulties noted semi-supine to/from sit  Transfers Overall transfer level:  Independent Equipment used: None             General transfer comment: steady safe transfers noted from bed and pt reporting no difficulties from toilet    Balance Overall balance assessment: Modified Independent Sitting-balance support: No upper extremity supported;Feet supported Sitting balance-Leahy Scale: Normal Sitting balance - Comments: steady sitting reaching outside BOS   Standing balance support: No upper extremity supported;During functional activity Standing balance-Leahy Scale: Good Standing balance comment: No loss of balance with ambulation and looking R/L/up/down with eyes only; very mild loss of balance looking R initially with head turn R--pt able to easily self correct (but no loss of balance 2nd trial) during ambulation; no loss of balance with looking L/up/down (with head turns to each respective direction) during ambulation                           ADL either performed or assessed with clinical judgement   ADL Overall ADL's : At baseline                                       General ADL Comments: Pt is currently at baseline for ADLs but in need of instruction and rec for completing LB dressing to avoid increased back pain and dizziness when leaning forward at EOB.  Instructed in use of reacher and sock aid and given adapative equipment catalog on how to order and  compare prices with local stores.  Pt is very motivated to be as independent as possible.        Vision Patient Visual Report: Blurring of vision Vision Assessment?: Yes Eye Alignment: Within Functional Limits Ocular Range of Motion: Within Functional Limits Alignment/Gaze Preference: Within Defined Limits Tracking/Visual Pursuits: Able to track stimulus in all quads without difficulty Saccades: Within functional limits Convergence: Within functional limits Visual Fields: No apparent deficits Additional Comments: intact depth perception and has blurriness not  dipolopia.  No visual deficits noted.      Perception     Praxis      Pertinent Vitals/Pain Pain Assessment: No/denies pain     Hand Dominance Right   Extremity/Trunk Assessment Upper Extremity Assessment Upper Extremity Assessment: Overall WFL for tasks assessed(no deficits in strength, tone or coordination noted in R side)   Lower Extremity Assessment Lower Extremity Assessment: Defer to PT evaluation   Cervical / Trunk Assessment Cervical / Trunk Assessment: Normal   Communication Communication Communication: No difficulties   Cognition Arousal/Alertness: Awake/alert Behavior During Therapy: WFL for tasks assessed/performed Overall Cognitive Status: Within Functional Limits for tasks assessed                                     General Comments       Exercises     Shoulder Instructions      Home Living Family/patient expects to be discharged to:: Private residence Living Arrangements: Spouse/significant other Available Help at Discharge: Family Type of Home: House Home Access: Ramped entrance     Home Layout: Two level;Bed/bath upstairs;Able to live on main level with bedroom/bathroom     Bathroom Shower/Tub: Occupational psychologist: Standard     Home Equipment: None;Shower seat - built in;Grab bars - tub/shower          Prior Functioning/Environment Level of Independence: Independent        Comments: High school sports Journalist, newspaper        OT Problem List: Pain;Decreased knowledge of use of DME or AE      OT Treatment/Interventions:      OT Goals(Current goals can be found in the care plan section) Acute Rehab OT Goals Patient Stated Goal: to regain independence to take care of all my needs again. OT Goal Formulation: With patient Time For Goal Achievement: 01/03/19 Potential to Achieve Goals: Good  OT Frequency:     Barriers to D/C:            Co-evaluation              AM-PAC OT "6 Clicks"  Daily Activity     Outcome Measure Help from another person eating meals?: None Help from another person taking care of personal grooming?: None Help from another person toileting, which includes using toliet, bedpan, or urinal?: None Help from another person bathing (including washing, rinsing, drying)?: None Help from another person to put on and taking off regular upper body clothing?: None Help from another person to put on and taking off regular lower body clothing?: A Little 6 Click Score: 23   End of Session Equipment Utilized During Treatment: Gait belt  Activity Tolerance: Patient tolerated treatment well Patient left: in bed;with call bell/phone within reach;with bed alarm set  OT Visit Diagnosis: Other symptoms and signs involving the nervous system (R29.898)  Time: 5872-7618 OT Time Calculation (min): 25 min Charges:  OT General Charges $OT Visit: 1 Visit OT Evaluation $OT Eval Low Complexity: 1 Low OT Treatments $Self Care/Home Management : 8-22 mins  Chrys Racer, OTR/L ascom [phone] 01/03/19, 1:30 PM

## 2019-01-03 NOTE — Progress Notes (Signed)
SLP Cancellation Note  Patient Details Name: NEZAR BUCKLES MRN: [id number] DOB: Dec 04, 1960   Cancelled treatment:       Reason Eval/Treat Not Completed: SLP screened, no needs identified, will sign off(chart reviewed; consulted NSG then met w/ pt in room). Pt denied any difficulty swallowing and is currently on a regular diet; tolerates swallowing pills w/ water per NSG. Pt conversed at conversational level w/ SLP, NSG w/out deficits noted; pt denied any speech-language deficits.  No further skilled ST services indicated as pt appears at his baseline. Pt agreed. NSG to reconsult if any change in status while admitted.    Orinda Kenner, MS, CCC-SLP Watson,Katherine 01/03/2019, 1:39 PM

## 2019-01-03 NOTE — Progress Notes (Signed)
*  PRELIMINARY RESULTS* Echocardiogram 2D Echocardiogram has been performed.  Randy Watson Culver Feighner 01/03/2019, 10:43 AM

## 2019-01-03 NOTE — Progress Notes (Signed)
Subjective: Significant improvement in vertigo. No signs of dysmetria. Pt was able to ambulate without assistance.  Past Medical History:  Diagnosis Date  . Allergy   . Anal fissure   . Constipation   . GERD (gastroesophageal reflux disease)   . History of chicken pox   . Obesity     History reviewed. No pertinent surgical history.  Family History  Problem Relation Age of Onset  . Cancer Father        renal/pancreatic  . CAD Father   . Prostate cancer Maternal Grandfather   . Bladder Cancer Neg Hx     Social History:  reports that he has never smoked. His smokeless tobacco use includes chew. He reports current alcohol use. He reports that he does not use drugs.  Allergies  Allergen Reactions  . Penicillins     Medications: I have reviewed the patient's current medications.   Physical Examination: Blood pressure (!) 143/90, pulse 90, temperature 98.3 F (36.8 C), temperature source Oral, resp. rate 20, height 6\' 2"  (1.88 m), weight 117.9 kg, SpO2 96 %.   Neurological Examination   Mental Status: Alert, oriented, thought content appropriate.  Speech fluent without evidence of aphasia.  Able to follow 3 step commands without difficulty. Cranial Nerves: II: Discs flat bilaterally; Visual fields grossly normal, pupils equal, round, reactive to light and accommodation III,IV, VI: ptosis not present, extra-ocular motions intact bilaterally V,VII: smile symmetric, facial light touch sensation normal bilaterally VIII: hearing normal bilaterally IX,X: gag reflex present XI: bilateral shoulder shrug XII: midline tongue extension Motor: Right : Upper extremity   5/5    Left:     Upper extremity   5/5  Lower extremity   5/5     Lower extremity   5/5 Tone and bulk:normal tone throughout; no atrophy noted Sensory: Pinprick and light touch intact throughout, bilaterally Deep Tendon Reflexes: 2+ and symmetric throughout Plantars: Right: downgoing   Left:  downgoing Cerebellar: L dysmetria  Gait: not tested.       Laboratory Studies:   Basic Metabolic Panel: Recent Labs  Lab 01/02/19 0938  NA 135  K 4.1  CL 99  CO2 25  GLUCOSE 177*  BUN 15  CREATININE 0.76  CALCIUM 9.2    Liver Function Tests: Recent Labs  Lab 01/02/19 0938  AST 23  ALT 27  ALKPHOS 99  BILITOT 0.8  PROT 7.9  ALBUMIN 4.2   Recent Labs  Lab 01/02/19 0938  LIPASE 22   No results for input(s): AMMONIA in the last 168 hours.  CBC: Recent Labs  Lab 01/02/19 0938  WBC 12.8*  HGB 16.1  HCT 46.2  MCV 90.1  PLT 289    Cardiac Enzymes: Recent Labs  Lab 01/02/19 0938  TROPONINI <0.03    BNP: Invalid input(s): POCBNP  CBG: No results for input(s): GLUCAP in the last 168 hours.  Microbiology: Results for orders placed or performed during the hospital encounter of 01/02/19  SARS Coronavirus 2 (CEPHEID - Performed in [place] hospital lab), Hosp Order     Status: None   Collection Time: 01/02/19 12:07 PM  Result Value Ref Range Status   SARS Coronavirus 2 NEGATIVE NEGATIVE Final    Comment: (NOTE) If result is NEGATIVE SARS-CoV-2 target nucleic acids are NOT DETECTED. The SARS-CoV-2 RNA is generally detectable in upper and lower  respiratory specimens during the acute phase of infection. The lowest  concentration of SARS-CoV-2 viral copies this assay can detect is 250  copies /  mL. A negative result does not preclude SARS-CoV-2 infection  and should not be used as the sole basis for treatment or other  patient management decisions.  A negative result may occur with  improper specimen collection / handling, submission of specimen other  than nasopharyngeal swab, presence of viral mutation(s) within the  areas targeted by this assay, and inadequate number of viral copies  (<250 copies / mL). A negative result must be combined with clinical  observations, patient history, and epidemiological information. If result is  POSITIVE SARS-CoV-2 target nucleic acids are DETECTED. The SARS-CoV-2 RNA is generally detectable in upper and lower  respiratory specimens dur ing the acute phase of infection.  Positive  results are indicative of active infection with SARS-CoV-2.  Clinical  correlation with patient history and other diagnostic information is  necessary to determine patient infection status.  Positive results do  not rule out bacterial infection or co-infection with other viruses. If result is PRESUMPTIVE POSTIVE SARS-CoV-2 nucleic acids MAY BE PRESENT.   A presumptive positive result was obtained on the submitted specimen  and confirmed on repeat testing.  While 2019 novel coronavirus  (SARS-CoV-2) nucleic acids may be present in the submitted sample  additional confirmatory testing may be necessary for epidemiological  and / or clinical management purposes  to differentiate between  SARS-CoV-2 and other Sarbecovirus currently known to infect humans.  If clinically indicated additional testing with an alternate test  methodology [phone]) is advised. The SARS-CoV-2 RNA is generally  detectable in upper and lower respiratory sp ecimens during the acute  phase of infection. The expected result is Negative. Fact Sheet for Patients:  StrictlyIdeas.no Fact Sheet for Healthcare Providers: BankingDealers.co.za This test is not yet approved or cleared by the [place] FDA and has been authorized for detection and/or diagnosis of SARS-CoV-2 by FDA under an Emergency Use Authorization (EUA).  This EUA will remain in effect (meaning this test can be used) for the duration of the COVID-19 declaration under Section 564(b)(1) of the Act, 21 U.S.C. section 360bbb-3(b)(1), unless the authorization is terminated or revoked sooner. Performed at [facility], [place] [place], [place] [address]     Coagulation Studies: Recent Labs     01/02/19 0943  LABPROT 12.6  INR 1.0    Urinalysis: No results for input(s): COLORURINE, LABSPEC, PHURINE, GLUCOSEU, HGBUR, BILIRUBINUR, KETONESUR, PROTEINUR, UROBILINOGEN, NITRITE, LEUKOCYTESUR in the last 168 hours.  Invalid input(s): APPERANCEUR  Lipid Panel:     Component Value Date/Time   CHOL 224 (H) 01/02/2019 0938   TRIG 50 01/02/2019 0938   HDL 67 01/02/2019 0938   CHOLHDL 3.3 01/02/2019 0938   VLDL 10 01/02/2019 0938   LDLCALC 147 (H) 01/02/2019 0938    HgbA1C:  Lab Results  Component Value Date   HGBA1C 5.7 (H) 01/02/2019    Urine Drug Screen:  No results found for: LABOPIA, COCAINSCRNUR, LABBENZ, AMPHETMU, THCU, LABBARB  Alcohol Level: No results for input(s): ETH in the last 168 hours.  Other results: EKG: normal EKG, normal sinus rhythm, unchanged from previous tracings.  Imaging: Ct Angio Head W Or Wo Contrast  Result Date: 01/02/2019 CLINICAL DATA:  Dizziness, imbalance, nausea, and vomiting since last night. EXAM: CT ANGIOGRAPHY HEAD AND NECK TECHNIQUE: Multidetector CT imaging of the head and neck was performed using the standard protocol during bolus administration of intravenous contrast. Multiplanar CT image reconstructions and MIPs were obtained to evaluate the vascular anatomy. Carotid stenosis measurements (when applicable) are obtained utilizing NASCET  criteria, using the distal internal carotid diameter as the denominator. CONTRAST:  40mL OMNIPAQUE IOHEXOL 350 MG/ML SOLN COMPARISON:  Brain MRI 01/02/2019 FINDINGS: CT HEAD FINDINGS Brain: Left cerebellar hypoattenuation corresponds to the acute infarct on today's MRI. No acute intracranial hemorrhage, mass, midline shift, or extra-axial fluid collection is identified. The ventricles and sulci are normal. Vascular: Calcified atherosclerosis at the skull base. Skull: No fracture or focal osseous lesion. Sinuses: Left ethmoid air cell opacification. Clear mastoid air cells. Orbits: Unremarkable. Review of the  MIP images confirms the above findings CTA NECK FINDINGS Aortic arch: Normal variant aortic arch branching pattern with common origin of the brachiocephalic and left common carotid arteries. Widely patent arch vessel origins. Right carotid system: Patent with mild calcified plaque at the carotid bifurcation. No evidence of dissection or stenosis. Left carotid system: Patent with small volume soft plaque in the mid common carotid artery and at the carotid bifurcation. No evidence of dissection or stenosis. Vertebral arteries: The right vertebral artery is patent and robust in size without evidence of stenosis or dissection. The left vertebral artery is occluded at its origin with partial reconstitution of thready V2 and V3 segments. Skeleton: Mild cervical spondylosis. Other neck: Mild diffuse enlargement, heterogeneity, and nodularity of the thyroid gland with suggestion of an approximately 2 cm nodule in the isthmus. Upper chest: Clear lung apices. Review of the MIP images confirms the above findings CTA HEAD FINDINGS Anterior circulation: The internal carotid arteries are patent from skull base to carotid termini with mild atherosclerotic plaque bilaterally not resulting in significant stenosis. ACAs and MCAs are patent without evidence of proximal branch occlusion or significant proximal stenosis. No aneurysm is identified. Posterior circulation: The intracranial right vertebral artery is widely patent. The left vertebral artery is occluded near the V3-V4 junction with opacification of a small V4 segment more distally. Patent right PICA, bilateral AICA, and bilateral SCA origins are identified. A left PICA is not identified. The basilar artery is widely patent. There is a small left posterior communicating artery. Both PCAs are patent without evidence of significant proximal stenosis. No aneurysm is identified. Venous sinuses: Patent. Anatomic variants: None. Review of the MIP images confirms the above findings  IMPRESSION: 1. 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 2. Widely patent, robust right vertebral artery. 3. Mild intracranial atherosclerosis without significant anterior circulation stenosis. 4. Widely patent cervical carotid arteries. 5. Multinodular thyroid gland. Nonemergent thyroid ultrasound is recommended for further evaluation. These results were called by telephone at the time of interpretation on 01/02/2019 at 12:10 pm to Dr. Delman Kitten , who verbally acknowledged these results. Electronically Signed   By: Logan Bores M.D.   On: 01/02/2019 12:23   Ct Angio Neck W And/or Wo Contrast  Result Date: 01/02/2019 CLINICAL DATA:  Dizziness, imbalance, nausea, and vomiting since last night. EXAM: CT ANGIOGRAPHY HEAD AND NECK TECHNIQUE: Multidetector CT imaging of the head and neck was performed using the standard protocol during bolus administration of intravenous contrast. Multiplanar CT image reconstructions and MIPs were obtained to evaluate the vascular anatomy. Carotid stenosis measurements (when applicable) are obtained utilizing NASCET criteria, using the distal internal carotid diameter as the denominator. CONTRAST:  56mL OMNIPAQUE IOHEXOL 350 MG/ML SOLN COMPARISON:  Brain MRI 01/02/2019 FINDINGS: CT HEAD FINDINGS Brain: Left cerebellar hypoattenuation corresponds to the acute infarct on today's MRI. No acute intracranial hemorrhage, mass, midline shift, or extra-axial fluid collection is identified. The ventricles and sulci are normal. Vascular: Calcified atherosclerosis at  the skull base. Skull: No fracture or focal osseous lesion. Sinuses: Left ethmoid air cell opacification. Clear mastoid air cells. Orbits: Unremarkable. Review of the MIP images confirms the above findings CTA NECK FINDINGS Aortic arch: Normal variant aortic arch branching pattern with common origin of the brachiocephalic and left common carotid arteries. Widely patent arch vessel  origins. Right carotid system: Patent with mild calcified plaque at the carotid bifurcation. No evidence of dissection or stenosis. Left carotid system: Patent with small volume soft plaque in the mid common carotid artery and at the carotid bifurcation. No evidence of dissection or stenosis. Vertebral arteries: The right vertebral artery is patent and robust in size without evidence of stenosis or dissection. The left vertebral artery is occluded at its origin with partial reconstitution of thready V2 and V3 segments. Skeleton: Mild cervical spondylosis. Other neck: Mild diffuse enlargement, heterogeneity, and nodularity of the thyroid gland with suggestion of an approximately 2 cm nodule in the isthmus. Upper chest: Clear lung apices. Review of the MIP images confirms the above findings CTA HEAD FINDINGS Anterior circulation: The internal carotid arteries are patent from skull base to carotid termini with mild atherosclerotic plaque bilaterally not resulting in significant stenosis. ACAs and MCAs are patent without evidence of proximal branch occlusion or significant proximal stenosis. No aneurysm is identified. Posterior circulation: The intracranial right vertebral artery is widely patent. The left vertebral artery is occluded near the V3-V4 junction with opacification of a small V4 segment more distally. Patent right PICA, bilateral AICA, and bilateral SCA origins are identified. A left PICA is not identified. The basilar artery is widely patent. There is a small left posterior communicating artery. Both PCAs are patent without evidence of significant proximal stenosis. No aneurysm is identified. Venous sinuses: Patent. Anatomic variants: None. Review of the MIP images confirms the above findings IMPRESSION: 1. 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 2. Widely patent, robust right vertebral artery. 3. Mild intracranial atherosclerosis without significant anterior  circulation stenosis. 4. Widely patent cervical carotid arteries. 5. Multinodular thyroid gland. Nonemergent thyroid ultrasound is recommended for further evaluation. These results were called by telephone at the time of interpretation on 01/02/2019 at 12:10 pm to Dr. Delman Kitten , who verbally acknowledged these results. Electronically Signed   By: Logan Bores M.D.   On: 01/02/2019 12:23   Mr Brain Wo Contrast  Result Date: 01/02/2019 CLINICAL DATA:  Vertigo. EXAM: MRI HEAD WITHOUT CONTRAST TECHNIQUE: Multiplanar, multiecho pulse sequences of the brain and surrounding structures were obtained without intravenous contrast. COMPARISON:  None. FINDINGS: Brain: There is a moderate-sized acute left cerebellar infarct in the PICA territory. No intracranial hemorrhage, mass, midline shift, or extra-axial fluid collection is identified. Scattered punctate foci of T2 hyperintensity in the cerebral white matter bilaterally are nonspecific but compatible with mild chronic small vessel ischemic disease. The ventricles and sulci are normal in size. Vascular: Abnormal appearance of the distal left vertebral artery which may reflect slow flow or occlusion versus volume averaging. Other major intracranial vascular flow voids are preserved. Skull and upper cervical spine: Unremarkable bone marrow signal. Sinuses/Orbits: Unremarkable orbits. Left ethmoid air cell mucosal thickening. Clear mastoid air cells. Other: None. IMPRESSION: 1. Acute left cerebellar infarct. 2. Abnormal appearance of the distal left vertebral artery, possibly slow flow or occlusion. Recommend head and neck CTA or MRA for further evaluation. 3. Mild chronic small vessel ischemic disease. Electronically Signed   By: Logan Bores M.D.   On: 01/02/2019 10:33  Assessment/Plan:   58 y.o. male smoking(dips), ETOH use, chronic back pain on muscle relaxants. Last known well at 11 PM last night. Presented with acute onset of vertigo, N/V.  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    CTA left vertebral artery occlusion. But R vertebral dominant.   ASA/Statin Physical therapy appreciated Decrease ETOH and tobacco use discussed with patient.   D/c planning if safe from physical therapy stand point [facility], Sammi Stolarz  01/03/2019, 11:36 AM

## 2019-01-03 NOTE — Progress Notes (Signed)
Pt for discharge home. Alert/ no resp distress. Sl d/cd/ tele moniter d/cd/ instructions discussed with pt/  meds / diet / activity / and f/u discussed. Verbalizes understanding.wife  At entrance to pick pt up. Wheeled out via w/c for discharge home  W/o c/o.

## 2019-01-10 ENCOUNTER — Ambulatory Visit: Payer: BC Managed Care – PPO | Admitting: Family Medicine

## 2019-01-10 ENCOUNTER — Encounter: Payer: Self-pay | Admitting: Family Medicine

## 2019-01-10 ENCOUNTER — Other Ambulatory Visit: Payer: Self-pay

## 2019-01-10 VITALS — BP 138/83 | HR 79 | Temp 98.5°F | Resp 16 | Ht 74.0 in | Wt 260.0 lb

## 2019-01-10 DIAGNOSIS — I6930 Unspecified sequelae of cerebral infarction: Secondary | ICD-10-CM | POA: Diagnosis not present

## 2019-01-10 DIAGNOSIS — M542 Cervicalgia: Secondary | ICD-10-CM | POA: Diagnosis not present

## 2019-01-10 DIAGNOSIS — I6502 Occlusion and stenosis of left vertebral artery: Secondary | ICD-10-CM | POA: Diagnosis not present

## 2019-01-10 DIAGNOSIS — R42 Dizziness and giddiness: Secondary | ICD-10-CM | POA: Diagnosis not present

## 2019-01-10 NOTE — Patient Instructions (Addendum)
Thank you for coming to the office today.  Continue Aspirin 325mg  daily for now, likely for 3 months, then we can determine if need to change.  Continue Statin cholesterol medicine daily.  Start Melantonin 1-5mg  as needed at night for improving sleep cycle - OTC  Can take Tylenol PM  Sleep Hygiene Recommendations to promote healthy sleep in all patients, especially if symptoms of insomnia are worsening. Due to the nature of sleep rhythms, if your body gets "out of rhythm", it may take some time before your sleep cycle can be "reset".  Please try to follow as many of the following tips as you can, usually there are only a few of these are the primary cause of the problem.  ?To reset your sleep rhythm, go to bed and get up at the same time every day ?Sleep only long enough to feel rested and then get out of bed ?Do not try to force yourself to sleep. If you can't sleep, get out of bed and try again later. ?Avoid naps during the day, unless excessively tired. The more sleeping during the day, then the less sleep your body needs at night.  ?Have coffee, tea, and other foods that have caffeine only in the morning ?Exercise several days a week, but not right before bed ?If you drink alcohol, prefer to have appropriate drink with one meal, but prefer to avoid alcohol in the evening, and bedtime ?If you smoke, avoid smoking, especially in the evening  ?Avoid watching TV or looking at phones, computers, or reading devices ("e-books") that give off light at least 30 minutes before bed. This artificial light sends "awake signals" to your brain and can make it harder to fall asleep. ?Make your bedroom a comfortable place where it is easy to fall asleep: ? Put up shades or special blackout curtains to block light from outside. ? Use a white noise machine to block noise. ? Keep the temperature cool. ?Try your best to solve or at least address your problems before you go to bed ?Use relaxation  techniques to manage stress. Ask your health care provider to suggest some techniques that may work well for you. These may include: ? Breathing exercises. ? Routines to release muscle tension. ? Visualizing peaceful scenes.   Please schedule a Follow-up Appointment to: Return in about 3 months (around 04/12/2019) for 3 months post CVA follow-up, dizziness.  If you have any other questions or concerns, please feel free to call the office or send a message through [place]. You may also schedule an earlier appointment if necessary.  Additionally, you may be receiving a survey about your experience at our office within a few days to 1 week by e-mail or mail. We value your feedback.  [name], DO [place]

## 2019-01-10 NOTE — Progress Notes (Signed)
Subjective:    Patient ID: Randy Watson, male    DOB: Dec 28, 1960, 58 y.o.   MRN: [id number]  Randy Watson is a 58 y.o. male presenting on 01/10/2019 for Hospitalization Follow-up (stroke)  PCP is Randy Watson, AGPCNP-BC - I am currently covering during her maternity leave.   HPI  HOSPITAL FOLLOW-UP VISIT  Hospital/Location: [facility] Date of Admission: 01/02/19 Date of Discharge: 01/03/19 Transitions of care telephone call: Not completed.  Reason for Admission: Dizziness, Stroke Primary (+Secondary) Diagnosis: Acute L Cerebellar Infarction, secondary dizziness / vertigo  - Hospital H&P and Discharge Summary have been reviewed - Patient presents today 7 days after recent hospitalization. Brief summary of recent course, patient had symptoms of acute onset dizziness vertigo, hospitalized,  treated with rule out CVA had imaging that supported CVA on MRI, see report. Also had CT angio, identified some occlusion - was not previously on ASA prior to episode, now taking 325mg  daily  - Today reports overall has done well after discharge. Symptoms of stroke in general have resolved.  Currently improved. He has a little "foggy" at times. - He has completely weaned himself off of dip nicotine. Occasional nicotine. - Rare caffeine now. - Recent cardiac work-up - Cardiology 3-4 weeks ago Cardiac stress - conclusion was normal.  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   Current Outpatient Medications:  .  aspirin 325 MG tablet, Take 1 tablet (325 mg total) by mouth daily., Disp: 30 tablet, Rfl: 1 .  atorvastatin (LIPITOR) 40 MG tablet, Take  1 tablet (40 mg total) by mouth daily at 6 PM., Disp: 30 tablet, Rfl: 1 .  cyclobenzaprine (FLEXERIL) 10 MG tablet, Take 0.5-1 tablets (5-10 mg total) by mouth 3 (three) times daily as needed for muscle spasms., Disp: 30 tablet, Rfl: 0 .  meclizine (ANTIVERT) 25 MG tablet, Take 1 tablet (25 mg total) by mouth 3 (three) times daily as needed for dizziness., Disp: 15 tablet, Rfl: 0  ------------------------------------------------------------------------- Social History   Tobacco Use  . Smoking status: Never Smoker  . Smokeless tobacco: Current User    Types: Chew  . Tobacco comment: dip  Substance Use Topics  . Alcohol use: Yes    Comment: occasionally  . Drug use: No    Review of Systems Per HPI unless specifically indicated above     Objective:    BP 138/83   Pulse 79   Temp 98.5 F (36.9 C) (Oral)   Resp 16   Ht 6\' 2"  (1.88 m)   Wt 260 lb (117.9 kg)   BMI 33.38 kg/m   Wt Readings from Last 3 Encounters:  01/10/19 260 lb (117.9 kg)  01/02/19 260 lb (117.9 kg)  10/13/18 263 lb (119.3 kg)    Physical Exam Vitals signs and nursing note reviewed.  Constitutional:      General: He is not in acute distress.    Appearance: He is well-developed. He is not diaphoretic.     Comments: Well-appearing, comfortable, cooperative  HENT:     Head: Normocephalic and atraumatic.  Eyes:     General:        Right eye: No discharge.  Left eye: No discharge.     Conjunctiva/sclera: Conjunctivae normal.  Neck:     Musculoskeletal: Normal range of motion and neck supple.     Thyroid: No thyromegaly.  Cardiovascular:     Rate and Rhythm: Normal rate and regular rhythm.     Heart sounds: Normal heart sounds. No murmur.  Pulmonary:     Effort: Pulmonary effort is normal. No respiratory distress.     Breath sounds: Normal breath sounds. No wheezing or rales.  Musculoskeletal: Normal range of motion.  Lymphadenopathy:     Cervical: No cervical adenopathy.  Skin:    General:  Skin is warm and dry.     Findings: No erythema or rash.  Neurological:     General: No focal deficit present.     Mental Status: He is alert and oriented to person, place, and time.     Cranial Nerves: No cranial nerve deficit.     Sensory: No sensory deficit.     Motor: No weakness.     Coordination: Coordination normal.     Gait: Gait normal.  Psychiatric:        Behavior: Behavior normal.     Comments: Well groomed, good eye contact, normal speech and thoughts        Results for orders placed or performed during the hospital encounter of 01/02/19  SARS Coronavirus 2 (CEPHEID - Performed in [place] hospital lab), [facility] Order  Result Value Ref Range   SARS Coronavirus 2 NEGATIVE NEGATIVE  CBC  Result Value Ref Range   WBC 12.8 (H) 4.0 - 10.5 K/uL   RBC 5.13 4.22 - 5.81 MIL/uL   Hemoglobin 16.1 13.0 - 17.0 g/dL   HCT 46.2 39.0 - 52.0 %   MCV 90.1 80.0 - 100.0 fL   MCH 31.4 26.0 - 34.0 pg   MCHC 34.8 30.0 - 36.0 g/dL   RDW 12.0 11.5 - 15.5 %   Platelets 289 150 - 400 K/uL   nRBC 0.0 0.0 - 0.2 %  Comprehensive metabolic panel  Result Value Ref Range   Sodium 135 135 - 145 mmol/L   Potassium 4.1 3.5 - 5.1 mmol/L   Chloride 99 98 - 111 mmol/L   CO2 25 22 - 32 mmol/L   Glucose, Bld 177 (H) 70 - 99 mg/dL   BUN 15 6 - 20 mg/dL   Creatinine, Ser 0.76 0.61 - 1.24 mg/dL   Calcium 9.2 8.9 - 10.3 mg/dL   Total Protein 7.9 6.5 - 8.1 g/dL   Albumin 4.2 3.5 - 5.0 g/dL   AST 23 15 - 41 U/L   ALT 27 0 - 44 U/L   Alkaline Phosphatase 99 38 - 126 U/L   Total Bilirubin 0.8 0.3 - 1.2 mg/dL   GFR calc non Af Amer >60 >60 mL/min   GFR calc Af Amer >60 >60 mL/min   Anion gap 11 5 - 15  Lipase, blood  Result Value Ref Range   Lipase 22 11 - 51 U/L  Troponin I - ONCE - STAT  Result Value Ref Range   Troponin I <0.03 <0.03 ng/mL  APTT  Result Value Ref Range   aPTT 27 24 - 36 seconds  Protime-INR  Result Value Ref Range   Prothrombin Time 12.6 11.4 - 15.2 seconds   INR 1.0  0.8 - 1.2  Hemoglobin A1c  Result Value Ref Range   Hgb A1c MFr Bld 5.7 (H) 4.8 - 5.6 %   Mean Plasma Glucose 116.89  mg/dL  Lipid panel  Result Value Ref Range   Cholesterol 224 (H) 0 - 200 mg/dL   Triglycerides 50 <150 mg/dL   HDL 67 >40 mg/dL   Total CHOL/HDL Ratio 3.3 RATIO   VLDL 10 0 - 40 mg/dL   LDL Cholesterol 147 (H) 0 - 99 mg/dL  ECHOCARDIOGRAM COMPLETE  Result Value Ref Range   Weight 4,160 oz   Height 74 in   BP 143/90 mmHg      Assessment & Plan:   Problem List Items Addressed This Visit    History of cerebrovascular accident (CVA) with residual deficit - Primary   Occlusion of left vertebral artery    Other Visit Diagnoses    Episode of dizziness       Neck pain         Clinically with history of CVA (L cerebellar) with dizziness / vertigo now improving No focal neurological deficits No further intervention recommended for L vertebral artery occlusion, no longer scheduled w/ neurologist - He is on ASA 325mg  daily (consider 81 if needed) Continue on Statin currently, tolerating well, for prevention -Follow up within 3 months - consider reduce ASA from 325 down to 1x 81mg  daily consider neurology consultation at 3 months to determine longer term aspirin and other risk factors.   No orders of the defined types were placed in this encounter.   Follow up plan: Return in about 3 months (around 04/12/2019) for 3 months post CVA follow-up, dizziness.  [name], DO [facility] 01/10/2019, 11:05 AM

## 2019-02-27 ENCOUNTER — Encounter: Payer: Self-pay | Admitting: Nurse Practitioner

## 2019-02-27 ENCOUNTER — Other Ambulatory Visit: Payer: Self-pay | Admitting: Family Medicine

## 2019-02-27 DIAGNOSIS — Z8673 Personal history of transient ischemic attack (TIA), and cerebral infarction without residual deficits: Secondary | ICD-10-CM

## 2019-02-27 DIAGNOSIS — I639 Cerebral infarction, unspecified: Secondary | ICD-10-CM

## 2019-02-27 MED ORDER — ATORVASTATIN CALCIUM 40 MG PO TABS: 40.0000 mg | ORAL_TABLET | Freq: Every day | ORAL | 1 refills | Status: DC

## 2019-03-03 ENCOUNTER — Ambulatory Visit (INDEPENDENT_AMBULATORY_CARE_PROVIDER_SITE_OTHER): Payer: BC Managed Care – PPO | Admitting: Nurse Practitioner

## 2019-03-03 ENCOUNTER — Encounter: Payer: Self-pay | Admitting: Nurse Practitioner

## 2019-03-03 ENCOUNTER — Other Ambulatory Visit: Payer: Self-pay

## 2019-03-03 VITALS — BP 116/70 | HR 69 | Temp 98.6°F | Resp 16 | Ht 74.0 in | Wt 261.0 lb

## 2019-03-03 DIAGNOSIS — Z8673 Personal history of transient ischemic attack (TIA), and cerebral infarction without residual deficits: Secondary | ICD-10-CM

## 2019-03-03 DIAGNOSIS — R42 Dizziness and giddiness: Secondary | ICD-10-CM | POA: Diagnosis not present

## 2019-03-03 DIAGNOSIS — F419 Anxiety disorder, unspecified: Secondary | ICD-10-CM

## 2019-03-03 MED ORDER — MECLIZINE HCL 25 MG PO TABS: 25.0000 mg | ORAL_TABLET | Freq: Three times a day (TID) | ORAL | 1 refills | Status: DC | PRN

## 2019-03-03 MED ORDER — HYDROXYZINE HCL 10 MG PO TABS: 10.0000 mg | ORAL_TABLET | Freq: Three times a day (TID) | ORAL | 1 refills | Status: DC | PRN

## 2019-03-03 NOTE — Progress Notes (Signed)
Subjective:    Patient ID: Randy Watson, male    DOB: April 23, 1961, 58 y.o.   MRN: [id number]  Randy Watson is a 58 y.o. male presenting on 03/03/2019 for Anxiety  HPI Anxiety Patient reports to clinic today after Mychart communication requesting to start medication for anxiety.  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 He also had recent CVA and is not "not sure what to feel."  He states his primary CVA symptom was dizziness and reports he is not always dizzy any more, but is still very foggy.  This contributes to his anxiety.  He states, "everything is on my mind, I worry about everything."  Since his CVA, he has quit dipping tobacco cold [place].  Unsure about lifestyle with adding back alcohol after stroke.  Has had 1-2 beer per day on weekend over last few weeks.   [place] - Is trying to distract, occupy time to avoid racing thoughts. - Wakes up at night with mind racing.  Is able to go back to sleep, but wakes frequently.  Sleeps every night at 10 pm for adequate time to sleep but never feels rested when waking up.  GAD 7 : Generalized Anxiety Score 03/03/2019  Nervous, Anxious, on Edge 3  Control/stop worrying 2  Worry too much - different things 3  Trouble relaxing 2  Restless 1  Easily annoyed or irritable 0  Afraid - awful might happen 1  Total GAD 7 Score 12  Anxiety Difficulty Not difficult at all   Depression screen [facility] 2/9 03/03/2019 01/10/2019  Decreased Interest 0 0  Down, Depressed, Hopeless 0 0  PHQ - 2 Score 0 0  Altered sleeping 1 -  Tired, decreased energy 1 -  Change in appetite 0 -  Feeling bad or failure about yourself  0 -  Trouble concentrating 1 -  Moving slowly or fidgety/restless 0 -  Suicidal thoughts 0 -  PHQ-9 Score 3 -   Social History   Tobacco Use  . Smoking status: Never Smoker  . Smokeless tobacco: Current User    Types: Chew  . Tobacco comment: dip  Substance Use Topics  .  Alcohol use: Yes    Comment: occasionally  . Drug use: No   Review of Systems Per HPI unless specifically indicated above     Objective:    BP 116/70   Pulse 69   Temp 98.6 F (37 C) (Oral)   Resp 16   Ht 6\' 2"  (1.88 m)   Wt 261 lb (118.4 kg)   BMI 33.51 kg/m   Wt Readings from Last 3 Encounters:  03/03/19 261 lb (118.4 kg)  01/10/19 260 lb (117.9 kg)  01/02/19 260 lb (117.9 kg)    Physical Exam Vitals signs reviewed.  Constitutional:      General: He is not in acute distress.    Appearance: He is well-developed.  HENT:     Head: Normocephalic and atraumatic.  Neck:     Vascular: No carotid bruit.  Cardiovascular:     Rate and Rhythm: Normal rate and regular rhythm.     Pulses:          Radial pulses are 2+ on the right side and 2+ on the left side.       Posterior tibial pulses are 1+ on the right side and 1+ on the left side.     Heart sounds: Normal heart sounds, S1  normal and S2 normal.  Pulmonary:     Effort: Pulmonary effort is normal. No respiratory distress.     Breath sounds: Normal breath sounds and air entry.  Musculoskeletal:     Right lower leg: No edema.     Left lower leg: No edema.  Skin:    General: Skin is warm and dry.     Capillary Refill: Capillary refill takes less than 2 seconds.  Neurological:     Mental Status: He is alert and oriented to person, place, and time.  Psychiatric:        Attention and Perception: Attention normal.        Mood and Affect: Affect normal. Mood is anxious.        Speech: Speech is rapid and pressured.        Behavior: Behavior is hyperactive. Behavior is cooperative.        Thought Content: Thought content does not include homicidal or suicidal ideation. Thought content does not include homicidal or suicidal plan.        Cognition and Memory: Cognition and memory normal.    Results for orders placed or performed during the hospital encounter of 01/02/19  SARS Coronavirus 2 (CEPHEID - Performed in [facility] hospital lab), [facility] Order   Specimen: Nasopharyngeal Swab  Result Value Ref Range   SARS Coronavirus 2 NEGATIVE NEGATIVE  CBC  Result Value Ref Range   WBC 12.8 (H) 4.0 - 10.5 K/uL   RBC 5.13 4.22 - 5.81 MIL/uL   Hemoglobin 16.1 13.0 - 17.0 g/dL   HCT 46.2 39.0 - 52.0 %   MCV 90.1 80.0 - 100.0 fL   MCH 31.4 26.0 - 34.0 pg   MCHC 34.8 30.0 - 36.0 g/dL   RDW 12.0 11.5 - 15.5 %   Platelets 289 150 - 400 K/uL   nRBC 0.0 0.0 - 0.2 %  Comprehensive metabolic panel  Result Value Ref Range   Sodium 135 135 - 145 mmol/L   Potassium 4.1 3.5 - 5.1 mmol/L   Chloride 99 98 - 111 mmol/L   CO2 25 22 - 32 mmol/L   Glucose, Bld 177 (H) 70 - 99 mg/dL   BUN 15 6 - 20 mg/dL   Creatinine, Ser 0.76 0.61 - 1.24 mg/dL   Calcium 9.2 8.9 - 10.3 mg/dL   Total Protein 7.9 6.5 - 8.1 g/dL   Albumin 4.2 3.5 - 5.0 g/dL   AST 23 15 - 41 U/L   ALT 27 0 - 44 U/L   Alkaline Phosphatase 99 38 - 126 U/L   Total Bilirubin 0.8 0.3 - 1.2 mg/dL   GFR calc non Af Amer >60 >60 mL/min   GFR calc Af Amer >60 >60 mL/min   Anion gap 11 5 - 15  Lipase, blood  Result Value Ref Range   Lipase 22 11 - 51 U/L  Troponin I - ONCE - STAT  Result Value Ref Range   Troponin I <0.03 <0.03 ng/mL  APTT  Result Value Ref Range   aPTT 27 24 - 36 seconds  Protime-INR  Result Value Ref Range   Prothrombin Time 12.6 11.4 - 15.2 seconds   INR 1.0 0.8 - 1.2  Hemoglobin A1c  Result Value Ref Range   Hgb A1c MFr Bld 5.7 (H) 4.8 - 5.6 %   Mean Plasma Glucose 116.89 mg/dL  Lipid panel  Result Value Ref Range   Cholesterol 224 (H) 0 - 200 mg/dL   Triglycerides 50 <150  mg/dL   HDL 67 >40 mg/dL   Total CHOL/HDL Ratio 3.3 RATIO   VLDL 10 0 - 40 mg/dL   LDL Cholesterol 147 (H) 0 - 99 mg/dL  ECHOCARDIOGRAM COMPLETE  Result Value Ref Range   Weight 4,160 oz   Height 74 in   BP 143/90 mmHg      Assessment & Plan:   Problem List Items Addressed This Visit    None    Visit Diagnoses    Anxiety    -  Primary  Uncontrolled anxiety with no prior treatment.  Likely influenced by recent CVA (12/2018).  Currently inadequate coping skills. Complicated by life change of retirement as well.  Plan: 1. Discussed anxiety and depression are common after CVA/MI events.  Patient likely to benefit from therapy/counseling. Patient declines currently. 2. Offered medication options of SSRI vs buspirone.  Patient declines at this time. 3. START hydroxyzine 10 mg tid prn anxiety or insomnia.  Cautioned can worsen dizziness 4. Encouraged deep breathing, calming activities, grounding/body scan. 5. Follow-up 6 weeks.   Relevant Medications   hydrOXYzine (ATARAX/VISTARIL) 10 MG tablet   Dizziness     Improving, but persistent from CVA 12/2018.  This sensation contributes to patients anxiety.   1. May continue meclizine 25 mg tid prn dizziness. Avoid taking at same time as hydroxyzine. 2. Recommend follow-up with neurology.  May need to consider vestibular therapy. 3. Follow-up prn.   Relevant Medications   meclizine (ANTIVERT) 25 MG tablet   History of CVA (cerebrovascular accident)     See Dizziness above.   1. Also recommend continuing statin, cholesterol management as secondary prevention.   2. Keep BP controlled and it is today. 3. Follow-up with neurology as above.  Referral placed.   Relevant Medications   meclizine (ANTIVERT) 25 MG tablet      Meds ordered this encounter  Medications  . hydrOXYzine (ATARAX/VISTARIL) 10 MG tablet    Sig: Take 1-2 tablets (10-20 mg total) by mouth 3 (three) times daily as needed for anxiety (insomnia).    Dispense:  60 tablet    Refill:  1    Order Specific Question:   Supervising Provider    Answer:   Olin Hauser [[id number]]  . meclizine (ANTIVERT) 25 MG tablet    Sig: Take 1 tablet (25 mg total) by mouth 3 (three) times daily as needed for dizziness.    Dispense:  45 tablet    Refill:  1    Order Specific Question:   Supervising Provider    Answer:    Olin Hauser [[id number]]    Follow up plan: Return in about 6 weeks (around 04/14/2019) for anxiety.  Cassell Smiles, DNP, AGPCNP-BC Adult Gerontology Primary Care Nurse Practitioner [facility] 03/03/2019, 8:10 AM

## 2019-03-03 NOTE — Patient Instructions (Addendum)
Randy Watson,   Thank you for coming in to clinic today.  1. Coping strategies: Deep breathing: A) 4-7-8 breathing technique: breathe in to count of 4, hold breath for count of 7, exhale for count of 8; do 3-5 times for letting go of overactive thoughts B)4-4-4 technique - same pattern as above C) 8-8 technique - breath in for 8 counts, out for 8 counts  Remember body scan/grounding exercise.  Start with 2 breaths, scan body head to toe - lengthen respirations, focus on slowing Heart Rate, release muscle tension. Then end 2 breaths.   Quiet activity on paper: doodle, draw, write/journal, coloring, or other calm/quiet activity  2. Mychart message if you want counseling recommendations.  3. Review escitalopram (Lexapro) info.  If you decide to start, mychart message and I can prescribe this.  4. START hydroxyzine 10 mg tablet.  Take 1-2 tablets (10-20 mg) up to three times daily as needed for anxiety or insomnia.  Please schedule a follow-up appointment with Cassell Smiles, AGNP. Return in about 6 weeks (around 04/14/2019) for anxiety.  If you have any other questions or concerns, please feel free to call the clinic or send a message through [facility]. You may also schedule an earlier appointment if necessary.  You will receive a survey after today's visit either digitally by e-mail or paper by [employer]. Your experiences and feedback matter to [place].  Please respond so we know how we are doing as we provide care for you.  Cassell Smiles, DNP, AGNP-BC Adult Gerontology Nurse Practitioner [facility], [facility]   Escitalopram tablets What is this medicine? ESCITALOPRAM (es sye TAL oh pram) is used to treat depression and certain types of anxiety. This medicine may be used for other purposes; ask your health care provider or pharmacist if you have questions. COMMON BRAND NAME(S): Lexapro What should I tell my health care provider before I take this medicine? They need to  know if you have any of these conditions:  bipolar disorder or a family history of bipolar disorder  diabetes  glaucoma  heart disease  kidney or liver disease  receiving electroconvulsive therapy  seizures (convulsions)  suicidal thoughts, plans, or attempt by you or a family member  an unusual or allergic reaction to escitalopram, the related drug citalopram, other medicines, foods, dyes, or preservatives  pregnant or trying to become pregnant  breast-feeding How should I use this medicine? Take this medicine by mouth with a glass of water. Follow the directions on the prescription label. You can take it with or without food. If it upsets your stomach, take it with food. Take your medicine at regular intervals. Do not take it more often than directed. Do not stop taking this medicine suddenly except upon the advice of your doctor. Stopping this medicine too quickly may cause serious side effects or your condition may worsen. A special MedGuide will be given to you by the pharmacist with each prescription and refill. Be sure to read this information carefully each time. Talk to your pediatrician regarding the use of this medicine in children. Special care may be needed. Overdosage: If you think you have taken too much of this medicine contact a poison control center or emergency room at once. NOTE: This medicine is only for you. Do not share this medicine with others. What if I miss a dose? If you miss a dose, take it as soon as you can. If it is almost time for your next dose, take only  that dose. Do not take double or extra doses. What may interact with this medicine? Do not take this medicine with any of the following medications:  certain medicines for fungal infections like fluconazole, itraconazole, ketoconazole, posaconazole, voriconazole  cisapride  citalopram  dronedarone  linezolid  MAOIs like Carbex, Eldepryl, Marplan, Nardil, and Parnate  methylene blue  (injected into a vein)  pimozide  thioridazine This medicine may also interact with the following medications:  alcohol  amphetamines  aspirin and aspirin-like medicines  carbamazepine  certain medicines for depression, anxiety, or psychotic disturbances  certain medicines for migraine headache like almotriptan, eletriptan, frovatriptan, naratriptan, rizatriptan, sumatriptan, zolmitriptan  certain medicines for sleep  certain medicines that treat or prevent blood clots like warfarin, enoxaparin, dalteparin  cimetidine  diuretics  dofetilide  fentanyl  furazolidone  isoniazid  lithium  metoprolol  NSAIDs, medicines for pain and inflammation, like ibuprofen or naproxen  other medicines that prolong the QT interval (cause an abnormal heart rhythm)  procarbazine  rasagiline  supplements like St. John's wort, kava kava, valerian  tramadol  tryptophan  ziprasidone This list may not describe all possible interactions. Give your health care provider a list of all the medicines, herbs, non-prescription drugs, or dietary supplements you use. Also tell them if you smoke, drink alcohol, or use illegal drugs. Some items may interact with your medicine. What should I watch for while using this medicine? Tell your doctor if your symptoms do not get better or if they get worse. Visit your doctor or health care professional for regular checks on your progress. Because it may take several weeks to see the full effects of this medicine, it is important to continue your treatment as prescribed by your doctor. Patients and their families should watch out for new or worsening thoughts of suicide or depression. Also watch out for sudden changes in feelings such as feeling anxious, agitated, panicky, irritable, hostile, aggressive, impulsive, severely restless, overly excited and hyperactive, or not being able to sleep. If this happens, especially at the beginning of treatment or  after a change in dose, call your health care professional. Dennis Bast may get drowsy or dizzy. Do not drive, use machinery, or do anything that needs mental alertness until you know how this medicine affects you. Do not stand or sit up quickly, especially if you are an older patient. This reduces the risk of dizzy or fainting spells. Alcohol may interfere with the effect of this medicine. Avoid alcoholic drinks. Your mouth may get dry. Chewing sugarless gum or sucking hard candy, and drinking plenty of water may help. Contact your doctor if the problem does not go away or is severe. What side effects may I notice from receiving this medicine? Side effects that you should report to your doctor or health care professional as soon as possible:  allergic reactions like skin rash, itching or hives, swelling of the face, lips, or tongue  anxious  black, tarry stools  changes in vision  confusion  elevated mood, decreased need for sleep, racing thoughts, impulsive behavior  eye pain  fast, irregular heartbeat  feeling faint or lightheaded, falls  feeling agitated, angry, or irritable  hallucination, loss of contact with reality  loss of balance or coordination  loss of memory  painful or prolonged erections  restlessness, pacing, inability to keep still  seizures  stiff muscles  suicidal thoughts or other mood changes  trouble sleeping  unusual bleeding or bruising  unusually weak or tired  vomiting Side effects that  usually do not require medical attention (report to your doctor or health care professional if they continue or are bothersome):  changes in appetite  change in sex drive or performance  headache  increased sweating  indigestion, nausea  tremors This list may not describe all possible side effects. Call your doctor for medical advice about side effects. You may report side effects to FDA at 1-800-FDA-1088. Where should I keep my medicine? Keep out of  reach of children. Store at room temperature between 15 and 30 degrees C (59 and 86 degrees F). Throw away any unused medicine after the expiration date. NOTE: This sheet is a summary. It may not cover all possible information. If you have questions about this medicine, talk to your doctor, pharmacist, or health care provider.  2020 Elsevier/Gold Standard (2018-07-18 11:21:44)

## 2019-03-08 ENCOUNTER — Encounter: Payer: Self-pay | Admitting: Nurse Practitioner

## 2019-03-12 ENCOUNTER — Encounter: Payer: Self-pay | Admitting: Nurse Practitioner

## 2019-04-12 ENCOUNTER — Other Ambulatory Visit: Payer: Self-pay

## 2019-04-12 ENCOUNTER — Encounter: Payer: Self-pay | Admitting: Nurse Practitioner

## 2019-04-12 ENCOUNTER — Ambulatory Visit (INDEPENDENT_AMBULATORY_CARE_PROVIDER_SITE_OTHER): Payer: BC Managed Care – PPO | Admitting: Nurse Practitioner

## 2019-04-12 VITALS — BP 147/78 | HR 78 | Ht 74.0 in | Wt 269.6 lb

## 2019-04-12 DIAGNOSIS — I6930 Unspecified sequelae of cerebral infarction: Secondary | ICD-10-CM | POA: Diagnosis not present

## 2019-04-12 DIAGNOSIS — E785 Hyperlipidemia, unspecified: Secondary | ICD-10-CM | POA: Diagnosis not present

## 2019-04-12 DIAGNOSIS — J309 Allergic rhinitis, unspecified: Secondary | ICD-10-CM | POA: Diagnosis not present

## 2019-04-12 DIAGNOSIS — Z79899 Other long term (current) drug therapy: Secondary | ICD-10-CM | POA: Diagnosis not present

## 2019-04-12 NOTE — Patient Instructions (Addendum)
Randy Watson,   Thank you for coming in to clinic today.  1. START Nasacort or Flonase one spray in each nostril daily for at least 14 days, continue for longer if needed. - If you start having nose bleeds, stop   2. START over the counter antihistamine - Claritin, Zyrtec, OR Allegra  3. Continue humidifier as needed.   4. Nasal saline spray also okay to use.  5. START using hydroxyzine only as needed.  Try half tablet. OR try benadryl one tablet instead to transition back to no sleep aid. - May also use melatonin as needed during your transition.   Sleep Hygiene Tips  Take medicines only as directed by your health care provider.  Keep regular sleeping and waking hours. Avoid naps.  Keep a sleep diary to help you and your health care provider figure out what could be causing your insomnia. Include:  When you sleep.  When you wake up during the night.  How well you sleep.  How rested you feel the next day.  Any side effects of medicines you are taking.  What you eat and drink.  Make your bedroom a comfortable place where it is easy to fall asleep:  Put up shades or special blackout curtains to block light from outside.  Use a white noise machine to block noise.  Keep the temperature cool.  Exercise regularly as directed by your health care provider. Avoid exercising right before bedtime.  Use relaxation techniques to manage stress. Ask your health care provider to suggest some techniques that may work well for you. These may include:  Breathing exercises.  Routines to release muscle tension.  Visualizing peaceful scenes.  Cut back on alcohol, caffeinated beverages, and cigarettes, especially close to bedtime. These can disrupt your sleep.  Do not overeat or eat spicy foods right before bedtime. This can lead to digestive discomfort that can make it hard for you to sleep.  Limit screen use before bedtime. This includes:  Watching TV.  Using your  smartphone, tablet, and computer.  Stick to a routine. This can help you fall asleep faster. Try to do a quiet activity, brush your teeth, and go to bed at the same time each night.  Get out of bed if you are still awake after 15 minutes of trying to sleep. Keep the lights down, but try reading or doing a quiet activity. When you feel sleepy, go back to bed.  Make sure that you drive carefully. Avoid driving if you feel very sleepy.  Keep all follow-up appointments as directed by your health care provider. This is important.  Please schedule a follow-up appointment with Cassell Smiles, AGNP. Return in about 6 months (around 10/10/2019) for cholesterol.  If you have any other questions or concerns, please feel free to call the clinic or send a message through [place]. You may also schedule an earlier appointment if necessary.  You will receive a survey after today's visit either digitally by e-mail or paper by [employer]. Your experiences and feedback matter to [place].  Please respond so we know how we are doing as we provide care for you.   Cassell Smiles, DNP, AGNP-BC Adult Gerontology Nurse Practitioner [place]

## 2019-04-12 NOTE — Progress Notes (Signed)
Subjective:    Patient ID: Randy Watson, male    DOB: March 16, 1961, 58 y.o.   MRN: [id number]  Randy Watson is a 58 y.o. male presenting on 04/12/2019 for Transient Ischemic Attack (follow from TIA x 4 mths ago. The neurologist intial appt is scheduled for 04/27/19 . Bilateral ear discomfort w/ nasal congestion x 2 weeks )   HPI CVA follow-up NEW neurology appt 04/27/19 Anxiety is much improved.  Takes hydroxyzine at night for helping sleep.  Otherwise, is not needing any during the day.   - Patient notes only intermittent dizziness.  Not currently needing meclizine.  Significantly improved. - Heat tolerance is still bothersome, but improving.  Patient is able to mow the yard again.   - Wife notes impaired memory, but patient states this is "not bad."  Ear discomfort x 2 weeks Sinus congestion,  Mild throat "tightness"  Patient is having post nasal drainage.   - Humidifier in room last night - Side effect dry throat from hydroxyzine, sleeps with mouth open. - Ears with mild discomfort.  Patient has taken nothing OTC for symptoms.  Patient took Dayquil HBP.  Social History   Tobacco Use  . Smoking status: Never Smoker  . Smokeless tobacco: Former Systems developer    Types: Chew  . Tobacco comment: dip  Substance Use Topics  . Alcohol use: Yes    Comment: occasionally  . Drug use: No    Review of Systems Per HPI unless specifically indicated above     Objective:    BP (!) 147/78 (BP Location: Left Arm, Patient Position: Sitting, Cuff Size: Large)   Pulse 78   Ht 6\' 2"  (1.88 m)   Wt 269 lb 9.6 oz (122.3 kg)   BMI 34.61 kg/m   Wt Readings from Last 3 Encounters:  04/12/19 269 lb 9.6 oz (122.3 kg)  03/03/19 261 lb (118.4 kg)  01/10/19 260 lb (117.9 kg)    Physical Exam Vitals signs reviewed.  Constitutional:      General: He is awake. He is not in acute distress.    Appearance: He is well-developed.  HENT:     Head: Normocephalic and atraumatic.     Right Ear: Ear  canal and external ear normal. No middle ear effusion. Tympanic membrane is retracted. Tympanic membrane is not injected, scarred, perforated or erythematous.     Left Ear: Ear canal and external ear normal.  No middle ear effusion. Tympanic membrane is retracted. Tympanic membrane is not injected, scarred, perforated or erythematous.     Nose: Mucosal edema and rhinorrhea present. Rhinorrhea is clear.     Right Sinus: No maxillary sinus tenderness or frontal sinus tenderness.     Left Sinus: No maxillary sinus tenderness or frontal sinus tenderness.     Mouth/Throat:     Lips: Pink.     Mouth: Mucous membranes are moist.     Pharynx: Pharyngeal swelling (cobblestoning) and oropharyngeal exudate (clear) present.     Tonsils: 0 on the right. 0 on the left.  Neck:     Musculoskeletal: Normal range of motion and neck supple.     Vascular: No carotid bruit.  Cardiovascular:     Rate and Rhythm: Normal rate and regular rhythm.     Pulses:          Radial pulses are 2+ on the right side and 2+ on the left side.       Posterior tibial pulses are 2+ on the right side and  2+ on the left side.     Heart sounds: Normal heart sounds, S1 normal and S2 normal.  Pulmonary:     Effort: Pulmonary effort is normal. No respiratory distress.     Breath sounds: Normal breath sounds and air entry.  Skin:    General: Skin is warm and dry.  Neurological:     Mental Status: He is alert and oriented to person, place, and time.  Psychiatric:        Attention and Perception: Attention normal.        Mood and Affect: Mood and affect normal.        Behavior: Behavior normal. Behavior is cooperative.    Results for orders placed or performed in visit on 04/12/19  Lipid panel  Result Value Ref Range   Cholesterol 117 <200 mg/dL   HDL 54 > OR = 40 mg/dL   Triglycerides 63 <150 mg/dL   LDL Cholesterol (Calc) 49 mg/dL (calc)   Total CHOL/HDL Ratio 2.2 <5.0 (calc)   Non-HDL Cholesterol (Calc) 63 <130 mg/dL  (calc)  COMPLETE METABOLIC PANEL WITH GFR  Result Value Ref Range   Glucose, Bld 126 (H) 65 - 99 mg/dL   BUN 12 7 - 25 mg/dL   Creat 0.95 0.70 - 1.33 mg/dL   GFR, Est Non African American 87 > OR = 60 mL/min/1.50m2   GFR, Est African American 101 > OR = 60 mL/min/1.31m2   BUN/Creatinine Ratio NOT APPLICABLE 6 - 22 (calc)   Sodium 142 135 - 146 mmol/L   Potassium 4.3 3.5 - 5.3 mmol/L   Chloride 106 98 - 110 mmol/L   CO2 27 20 - 32 mmol/L   Calcium 9.4 8.6 - 10.3 mg/dL   Total Protein 6.5 6.1 - 8.1 g/dL   Albumin 4.1 3.6 - 5.1 g/dL   Globulin 2.4 1.9 - 3.7 g/dL (calc)   AG Ratio 1.7 1.0 - 2.5 (calc)   Total Bilirubin 0.4 0.2 - 1.2 mg/dL   Alkaline phosphatase (APISO) 89 35 - 144 U/L   AST 25 10 - 35 U/L   ALT 42 9 - 46 U/L      Assessment & Plan:   Problem List Items Addressed This Visit      Other   History of cerebrovascular accident (CVA) with residual deficit - Primary   Dyslipidemia    Other Visit Diagnoses    Medication management       Relevant Orders   Lipid panel (Completed)   COMPLETE METABOLIC PANEL WITH GFR (Completed)   Allergic rhinitis, unspecified seasonality, unspecified trigger          History of CVA with residual deficits (dizziness, cognitive impairment - mild short term memory loss, heat intolerance) Patient with significant improvement since last visit, now at almost 3 months post CVA.  Patient with good lifestyle modifications.  No interval TIA or other CVA symptoms.  Plan:  - Offered ST, patient declines.  Will call if changes his mind.  (For cognition work) - Conservation officer, historic buildings with statin - needs recheck for CMP (new start statin liver check) and lipid panel for LDL to goal <70 - Continue regular activity, rest breaks as needed. - Complete neurology follow-up  - Follow-up 6 mos   Allergic rhinitis Patient with mild eustachian tube dysfunction, post nasal drip causing bothersome symptoms.  Possible seasonal component.  Improved with  cool mist humidifier at this time.  Plan: 1. Encouraged OTC claritin, allegra, or zyrtec 2. Use Nasacort or Flonase  3. Can also use nasal saline spray 4. Encouraged regular humidifier cleaning 5. Follow-up prn not improving. No orders of the defined types were placed in this encounter.  Follow up plan: Return in about 6 months (around 10/10/2019) for cholesterol.  Cassell Smiles, DNP, AGPCNP-BC Adult Gerontology Primary Care Nurse Practitioner [facility] 04/12/2019, 8:14 AM

## 2019-04-13 LAB — COMPLETE METABOLIC PANEL WITH GFR
AG Ratio: 1.7 (calc) (ref 1.0–2.5)
ALT: 42 U/L (ref 9–46)
AST: 25 U/L (ref 10–35)
Albumin: 4.1 g/dL (ref 3.6–5.1)
Alkaline phosphatase (APISO): 89 U/L (ref 35–144)
BUN: 12 mg/dL (ref 7–25)
CO2: 27 mmol/L (ref 20–32)
Calcium: 9.4 mg/dL (ref 8.6–10.3)
Chloride: 106 mmol/L (ref 98–110)
Creat: 0.95 mg/dL (ref 0.70–1.33)
GFR, Est African American: 101 mL/min/{1.73_m2} (ref >=60)
GFR, Est Non African American: 87 mL/min/{1.73_m2} (ref >=60)
Globulin: 2.4 g/dL (calc) (ref 1.9–3.7)
Glucose, Bld: 126 mg/dL — ABNORMAL HIGH (ref 65–99)
Potassium: 4.3 mmol/L (ref 3.5–5.3)
Sodium: 142 mmol/L (ref 135–146)
Total Bilirubin: 0.4 mg/dL (ref 0.2–1.2)
Total Protein: 6.5 g/dL (ref 6.1–8.1)

## 2019-04-13 LAB — LIPID PANEL
Cholesterol: 117 mg/dL (ref <200)
HDL: 54 mg/dL (ref >=40)
LDL Cholesterol (Calc): 49 mg/dL (calc)
Non-HDL Cholesterol (Calc): 63 mg/dL (calc) (ref <130)
Total CHOL/HDL Ratio: 2.2 (calc) (ref <5.0)
Triglycerides: 63 mg/dL (ref <150)

## 2019-04-15 ENCOUNTER — Encounter: Payer: Self-pay | Admitting: Nurse Practitioner

## 2019-05-10 ENCOUNTER — Encounter: Payer: Self-pay | Admitting: Nurse Practitioner

## 2019-05-11 ENCOUNTER — Other Ambulatory Visit: Payer: Self-pay | Admitting: Nurse Practitioner

## 2019-05-11 DIAGNOSIS — E538 Deficiency of other specified B group vitamins: Secondary | ICD-10-CM

## 2019-05-11 MED ORDER — CYANOCOBALAMIN 1000 MCG/ML IJ SOLN: 1000.0000 ug | INTRAMUSCULAR | 0 refills | Status: DC

## 2019-05-11 MED ORDER — CYANOCOBALAMIN 1000 MCG/ML IJ SOLN: 1000.0000 ug | INTRAMUSCULAR | 5 refills | Status: DC

## 2019-05-17 ENCOUNTER — Ambulatory Visit (INDEPENDENT_AMBULATORY_CARE_PROVIDER_SITE_OTHER): Payer: BC Managed Care – PPO

## 2019-05-17 ENCOUNTER — Other Ambulatory Visit: Payer: Self-pay

## 2019-05-17 DIAGNOSIS — E538 Deficiency of other specified B group vitamins: Secondary | ICD-10-CM | POA: Diagnosis not present

## 2019-05-17 DIAGNOSIS — Z23 Encounter for immunization: Secondary | ICD-10-CM

## 2019-05-17 MED ORDER — CYANOCOBALAMIN 1000 MCG/ML IJ SOLN
1000.0000 ug | Freq: Once | INTRAMUSCULAR | Status: AC
  Administered 2019-05-17: 1000 ug via INTRAMUSCULAR

## 2019-05-18 ENCOUNTER — Ambulatory Visit: Payer: BC Managed Care – PPO

## 2019-05-24 ENCOUNTER — Other Ambulatory Visit: Payer: Self-pay

## 2019-05-24 ENCOUNTER — Ambulatory Visit (INDEPENDENT_AMBULATORY_CARE_PROVIDER_SITE_OTHER): Payer: BC Managed Care – PPO

## 2019-05-24 DIAGNOSIS — E538 Deficiency of other specified B group vitamins: Secondary | ICD-10-CM

## 2019-05-24 MED ORDER — CYANOCOBALAMIN 1000 MCG/ML IJ SOLN
1000.0000 ug | Freq: Once | INTRAMUSCULAR | Status: AC
  Administered 2019-05-24: 1000 ug via INTRAMUSCULAR

## 2019-05-26 ENCOUNTER — Other Ambulatory Visit
Admission: RE | Admit: 2019-05-26 | Discharge: 2019-05-26 | Disposition: A | Discharge status: Home / Self Care | Payer: BC Managed Care – PPO | Source: Ambulatory Visit | Attending: Internal Medicine | Admitting: Internal Medicine

## 2019-05-26 ENCOUNTER — Other Ambulatory Visit: Payer: Self-pay

## 2019-05-26 DIAGNOSIS — Z20828 Contact with and (suspected) exposure to other viral communicable diseases: Secondary | ICD-10-CM | POA: Diagnosis not present

## 2019-05-26 LAB — SARS CORONAVIRUS 2 (TAT 6-24 HRS): SARS Coronavirus 2: NEGATIVE

## 2019-05-29 ENCOUNTER — Other Ambulatory Visit: Payer: Self-pay

## 2019-05-29 ENCOUNTER — Ambulatory Visit: Payer: BC Managed Care – PPO

## 2019-05-31 ENCOUNTER — Ambulatory Visit: Payer: BC Managed Care – PPO

## 2019-05-31 ENCOUNTER — Encounter
Admission: RE | Disposition: A | Discharge status: Home / Self Care | Payer: Self-pay | Source: Home / Self Care | Attending: Internal Medicine

## 2019-05-31 ENCOUNTER — Ambulatory Visit
Admission: RE | Admit: 2019-05-31 | Discharge: 2019-05-31 | Disposition: A | Discharge status: Home / Self Care | Payer: BC Managed Care – PPO | Source: Home / Self Care | Attending: Internal Medicine | Admitting: Internal Medicine

## 2019-05-31 ENCOUNTER — Encounter: Payer: Self-pay | Admitting: *Deleted

## 2019-05-31 ENCOUNTER — Ambulatory Visit
Admission: RE | Admit: 2019-05-31 | Discharge: 2019-05-31 | Disposition: A | Discharge status: Home / Self Care | Payer: BC Managed Care – PPO | Attending: Internal Medicine | Admitting: Internal Medicine

## 2019-05-31 ENCOUNTER — Other Ambulatory Visit: Payer: Self-pay

## 2019-05-31 DIAGNOSIS — I10 Essential (primary) hypertension: Secondary | ICD-10-CM | POA: Diagnosis not present

## 2019-05-31 DIAGNOSIS — Z7982 Long term (current) use of aspirin: Secondary | ICD-10-CM | POA: Diagnosis not present

## 2019-05-31 DIAGNOSIS — F17220 Nicotine dependence, chewing tobacco, uncomplicated: Secondary | ICD-10-CM | POA: Diagnosis not present

## 2019-05-31 DIAGNOSIS — I4891 Unspecified atrial fibrillation: Secondary | ICD-10-CM | POA: Diagnosis not present

## 2019-05-31 DIAGNOSIS — Z79899 Other long term (current) drug therapy: Secondary | ICD-10-CM | POA: Diagnosis not present

## 2019-05-31 DIAGNOSIS — I739 Peripheral vascular disease, unspecified: Secondary | ICD-10-CM | POA: Diagnosis not present

## 2019-05-31 DIAGNOSIS — Z8673 Personal history of transient ischemic attack (TIA), and cerebral infarction without residual deficits: Secondary | ICD-10-CM | POA: Diagnosis present

## 2019-05-31 DIAGNOSIS — K219 Gastro-esophageal reflux disease without esophagitis: Secondary | ICD-10-CM | POA: Insufficient documentation

## 2019-05-31 DIAGNOSIS — F419 Anxiety disorder, unspecified: Secondary | ICD-10-CM | POA: Insufficient documentation

## 2019-05-31 DIAGNOSIS — E782 Mixed hyperlipidemia: Secondary | ICD-10-CM | POA: Insufficient documentation

## 2019-05-31 HISTORY — PX: TEE WITHOUT CARDIOVERSION: SHX5443

## 2019-05-31 SURGERY — ECHOCARDIOGRAM, TRANSESOPHAGEAL
Anesthesia: Moderate Sedation

## 2019-05-31 MED ORDER — SODIUM CHLORIDE FLUSH 0.9 % IV SOLN
INTRAVENOUS | Status: AC
  Filled 2019-05-31: qty 10

## 2019-05-31 MED ORDER — MIDAZOLAM HCL 5 MG/5ML IJ SOLN
INTRAMUSCULAR | Status: AC | PRN
  Administered 2019-05-31: 2 mg via INTRAVENOUS
  Administered 2019-05-31: 1 mg via INTRAVENOUS

## 2019-05-31 MED ORDER — BUTAMBEN-TETRACAINE-BENZOCAINE 2-2-14 % EX AERO
INHALATION_SPRAY | CUTANEOUS | Status: AC | PRN
  Administered 2019-05-31: 1 via TOPICAL

## 2019-05-31 MED ORDER — SODIUM CHLORIDE 0.9 % IV SOLN
INTRAVENOUS | Status: DC
  Administered 2019-05-31: 07:00:00 via INTRAVENOUS

## 2019-05-31 MED ORDER — MIDAZOLAM HCL 5 MG/5ML IJ SOLN
INTRAMUSCULAR | Status: AC
  Filled 2019-05-31: qty 5

## 2019-05-31 MED ORDER — FENTANYL CITRATE (PF) 100 MCG/2ML IJ SOLN
INTRAMUSCULAR | Status: AC
  Filled 2019-05-31: qty 2

## 2019-05-31 MED ORDER — SODIUM CHLORIDE FLUSH 0.9 % IV SOLN
INTRAVENOUS | Status: AC
  Filled 2019-05-31: qty 100

## 2019-05-31 MED ORDER — LIDOCAINE VISCOUS HCL 2 % MT SOLN
OROMUCOSAL | Status: AC | PRN
  Administered 2019-05-31: 15 mL via OROMUCOSAL

## 2019-05-31 MED ORDER — BUTAMBEN-TETRACAINE-BENZOCAINE 2-2-14 % EX AERO
INHALATION_SPRAY | CUTANEOUS | Status: AC
  Filled 2019-05-31: qty 5

## 2019-05-31 MED ORDER — FENTANYL CITRATE (PF) 100 MCG/2ML IJ SOLN
INTRAMUSCULAR | Status: AC | PRN
  Administered 2019-05-31: 50 ug via INTRAVENOUS
  Administered 2019-05-31: 25 ug via INTRAVENOUS

## 2019-05-31 MED ORDER — LIDOCAINE VISCOUS HCL 2 % MT SOLN
OROMUCOSAL | Status: AC
  Filled 2019-05-31: qty 15

## 2019-05-31 NOTE — Progress Notes (Signed)
*  PRELIMINARY RESULTS* Echocardiogram Echocardiogram Transesophageal has been performed.  Sherrie Sport 05/31/2019, 8:12 AM

## 2019-05-31 NOTE — Discharge Instructions (Signed)
Transesophageal Echocardiogram Transesophageal echocardiogram (TEE) is a test that uses sound waves to take pictures of your heart. TEE is done by passing a flexible tube down the esophagus. The esophagus is the tube that carries food from the throat to the stomach. The pictures give detailed images of your heart. This can help your doctor see if there are problems with your heart. What happens before the procedure? Staying hydrated Follow instructions from your doctor about hydration, which may include:  Up to 3 hours before the procedure - you may continue to drink clear liquids, such as: ? Water. ? Clear fruit juice. ? Black coffee. ? Plain tea.  Eating and drinking Follow instructions from your doctor about eating and drinking, which may include:  8 hours before the procedure - stop eating heavy meals or foods such as meat, fried foods, or fatty foods.  6 hours before the procedure - stop eating light meals or foods, such as toast or cereal.  6 hours before the procedure - stop drinking milk or drinks that contain milk.  3 hours before the procedure - stop drinking clear liquids. General instructions  You will need to take out any dentures or retainers.  Plan to have someone take you home from the hospital or clinic.  If you will be going home right after the procedure, plan to have someone with you for 24 hours.  Ask your doctor about: ? Changing or stopping your normal medicines. This is important if you take diabetes medicines or blood thinners. ? Taking over-the-counter medicines, vitamins, herbs, and supplements. ? Taking medicines such as aspirin and ibuprofen. These medicines can thin your blood. Do not take these medicines unless your doctor tells you to take them. What happens during the procedure?  To lower your risk of infection, your doctors will wash or clean their hands.  An IV will be put into one of your veins.  You will be given a medicine to help you  relax (sedative).  A medicine may be sprayed or gargled. This numbs the back of your throat.  Your blood pressure, heart rate, and breathing will be watched.  You may be asked to lay on your left side.  A bite block will be placed in your mouth. This keeps you from biting the tube.  The tip of the TEE probe will be placed into the back of your mouth.  You will be asked to swallow.  Your doctor will take pictures of your heart.  The probe and bite block will be taken out. The procedure may vary among doctors and hospitals. What happens after the procedure?   Your blood pressure, heart rate, breathing rate, and blood oxygen level will be watched until the medicines you were given have worn off.  When you first wake up, your throat may feel sore and numb. This will get better over time. You will not be allowed to eat or drink until the numbness has gone away.  Do not drive for 24 hours if you were given a medicine to help you relax. Summary  TEE is a test that uses sound waves to take pictures of your heart.  You will be given a medicine to help you relax.  Do not drive for 24 hours if you were given a medicine to help you relax. This information is not intended to replace advice given to you by your health care provider. Make sure you discuss any questions you have with your health care provider. Document Released:  05/24/2009 Document Revised: 04/15/2018 Document Reviewed: 10/28/2016 Elsevier Patient Education  [place].    Moderate Conscious Sedation, Adult, Care After These instructions provide you with information about caring for yourself after your procedure. Your health care provider may also give you more specific instructions. Your treatment has been planned according to current medical practices, but problems sometimes occur. Call your health care provider if you have any problems or questions after your procedure. What can I expect after the  procedure? After your procedure, it is common:  To feel sleepy for several hours.  To feel clumsy and have poor balance for several hours.  To have poor judgment for several hours.  To vomit if you eat too soon. Follow these instructions at home: For at least 24 hours after the procedure:   Do not: ? Participate in activities where you could fall or become injured. ? Drive. ? Use heavy machinery. ? Drink alcohol. ? Take sleeping pills or medicines that cause drowsiness. ? Make important decisions or sign legal documents. ? Take care of children on your own.  Rest. Eating and drinking  Follow the diet recommended by your health care provider.  If you vomit: ? Drink water, juice, or soup when you can drink without vomiting. ? Make sure you have little or no nausea before eating solid foods. General instructions  Have a responsible adult stay with you until you are awake and alert.  Take over-the-counter and prescription medicines only as told by your health care provider.  If you smoke, do not smoke without supervision.  Keep all follow-up visits as told by your health care provider. This is important. Contact a health care provider if:  You keep feeling nauseous or you keep vomiting.  You feel light-headed.  You develop a rash.  You have a fever. Get help right away if:  You have trouble breathing. This information is not intended to replace advice given to you by your health care provider. Make sure you discuss any questions you have with your health care provider. Document Released: 05/17/2013 Document Revised: 07/09/2017 Document Reviewed: 11/16/2015 Elsevier Patient Education  2020 [employer].

## 2019-05-31 NOTE — CV Procedure (Signed)
Transesophageal echocardiogram preliminary report  Randy Watson [id number] 10-07-1960  Preliminary diagnosis  Stroke with possible embolic source  Postprocedural diagnosis  Normal LV systolic function no apparent source of embolus  Time out A timeout was performed by the nursing staff and physicians specifically identifying the procedure performed, identification of the patient, the type of sedation, all allergies and medications, all pertinent medical history, and presedation assessment of nasopharynx. The patient and or family understand the risks of the procedure including the rare risks of death, stroke, heart attack, esophogeal perforation, sore throat, and reaction to medications given.  Moderate sedation During this procedure the patient has received Versed 3 milligrams and fentanyl 75 micrograms to achieve appropriate moderate sedation.  The patient had continued monitoring of heart rate, oxygenation, blood pressure, respiratory rate, and extent of signs of sedation throughout the entire procedure.  The patient received this moderate sedation over a period of 24 minutes.  Both the nursing staff and I were present during the procedure when the patient had moderate sedation for 100% of the time.  Treatment considerations  No further cardiac intervention due to no apparent cardiac source of embolus  For further details of transesophageal echocardiogram please refer to final report.  Signed,  Corey Skains M.D. [facility] 05/31/2019 8:04 AM

## 2019-06-01 ENCOUNTER — Encounter: Payer: Self-pay | Admitting: Internal Medicine

## 2019-06-01 ENCOUNTER — Ambulatory Visit (INDEPENDENT_AMBULATORY_CARE_PROVIDER_SITE_OTHER): Payer: BC Managed Care – PPO

## 2019-06-01 DIAGNOSIS — E538 Deficiency of other specified B group vitamins: Secondary | ICD-10-CM | POA: Diagnosis not present

## 2019-06-01 MED ORDER — CYANOCOBALAMIN 1000 MCG/ML IJ SOLN: 1000.0000 ug | Freq: Once | INTRAMUSCULAR | 0 refills | Status: DC

## 2019-06-01 MED ORDER — CYANOCOBALAMIN 1000 MCG/ML IJ SOLN
1000.0000 ug | Freq: Once | INTRAMUSCULAR | Status: AC
  Administered 2019-06-01: 1000 ug via INTRAMUSCULAR

## 2019-06-07 ENCOUNTER — Ambulatory Visit: Payer: BC Managed Care – PPO

## 2019-06-08 ENCOUNTER — Ambulatory Visit (INDEPENDENT_AMBULATORY_CARE_PROVIDER_SITE_OTHER): Payer: BC Managed Care – PPO

## 2019-06-08 ENCOUNTER — Telehealth: Payer: Self-pay

## 2019-06-08 ENCOUNTER — Other Ambulatory Visit: Payer: Self-pay

## 2019-06-08 DIAGNOSIS — E538 Deficiency of other specified B group vitamins: Secondary | ICD-10-CM

## 2019-06-08 DIAGNOSIS — G4733 Obstructive sleep apnea (adult) (pediatric): Secondary | ICD-10-CM | POA: Insufficient documentation

## 2019-06-08 MED ORDER — CYANOCOBALAMIN 1000 MCG/ML IJ SOLN
1000.0000 ug | Freq: Once | INTRAMUSCULAR | Status: AC
  Administered 2019-06-08: 1000 ug via INTRAMUSCULAR

## 2019-06-08 NOTE — Telephone Encounter (Signed)
The pt came in the office for his last weekly B12 injection. He was questioning when will he get his next B12 injection. I informed him that Lauren sent a prescription over to his pharmacy for the once a month injection. I also informed him that his next injection will be due on Nov. 28th. Appt scheduled for Nov 30th for his next injection.

## 2019-07-08 ENCOUNTER — Other Ambulatory Visit: Payer: Self-pay | Admitting: Nurse Practitioner

## 2019-07-08 DIAGNOSIS — E538 Deficiency of other specified B group vitamins: Secondary | ICD-10-CM

## 2019-07-10 ENCOUNTER — Ambulatory Visit (INDEPENDENT_AMBULATORY_CARE_PROVIDER_SITE_OTHER): Payer: BC Managed Care – PPO

## 2019-07-10 ENCOUNTER — Other Ambulatory Visit: Payer: Self-pay

## 2019-07-10 DIAGNOSIS — E538 Deficiency of other specified B group vitamins: Secondary | ICD-10-CM | POA: Diagnosis not present

## 2019-07-10 MED ORDER — CYANOCOBALAMIN 1000 MCG/ML IJ SOLN
1000.0000 ug | Freq: Once | INTRAMUSCULAR | Status: AC
  Administered 2019-07-10: 1000 ug via INTRAMUSCULAR

## 2019-07-24 ENCOUNTER — Ambulatory Visit
Admission: EM | Admit: 2019-07-24 | Discharge: 2019-07-24 | Disposition: A | Discharge status: Home / Self Care | Payer: BC Managed Care – PPO | Attending: Family Medicine | Admitting: Family Medicine

## 2019-07-24 ENCOUNTER — Other Ambulatory Visit: Payer: Self-pay

## 2019-07-24 DIAGNOSIS — M26629 Arthralgia of temporomandibular joint, unspecified side: Secondary | ICD-10-CM

## 2019-07-24 DIAGNOSIS — Z20822 Contact with and (suspected) exposure to covid-19: Secondary | ICD-10-CM

## 2019-07-24 DIAGNOSIS — Z20828 Contact with and (suspected) exposure to other viral communicable diseases: Secondary | ICD-10-CM

## 2019-07-24 MED ORDER — PREDNISONE 20 MG PO TABS: 20.0000 mg | ORAL_TABLET | Freq: Every day | ORAL | 0 refills | Status: DC

## 2019-07-24 NOTE — ED Triage Notes (Signed)
Pt reports left "ear" or jaw pain x past 2 months. Also wants COVID testing due to several family members testing positive in the past week.

## 2019-07-24 NOTE — Discharge Instructions (Signed)
Ice, discontinue gum chewing, tylenol as needed

## 2019-07-26 LAB — NOVEL CORONAVIRUS, NAA (HOSP ORDER, SEND-OUT TO REF LAB; TAT 18-24 HRS): SARS-CoV-2, NAA: NOT DETECTED

## 2019-07-26 NOTE — ED Provider Notes (Signed)
[facility]    CSN: [id number] Arrival date & time: 07/24/19  1753      History   Chief Complaint Chief Complaint  Patient presents with  . Jaw Pain  . COVID exposure    HPI Randy Watson is a 58 y.o. male.   58 yo male with a c/o left ear pain and jaw pain intermittently for the past 2 months. States he quit smoking but since then has been chewing gum.   Patient also requesting covid-19 testing due to possible exposure with positive family members.      Past Medical History:  Diagnosis Date  . Allergy   . Anal fissure   . Constipation   . GERD (gastroesophageal reflux disease)   . History of chicken pox   . Obesity     Patient Active Problem List   Diagnosis Date Noted  . Dyslipidemia 04/12/2019  . 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 History of cerebrovascular accident (CVA) with residual deficit 01/10/2019  . Acute CVA (cerebrovascular accident) ([place]) 01/02/2019    Past Surgical History:  Procedure Laterality Date  . TEE WITHOUT CARDIOVERSION N/A 05/31/2019   Procedure: TRANSESOPHAGEAL ECHOCARDIOGRAM (TEE);  Surgeon: Corey Skains, MD;  Location: [facility] ORS;  Service: Cardiovascular;  Laterality: N/A;       Home Medications    Prior to Admission medications   Medication Sig Start Date End Date Taking? Authorizing Provider  acetaminophen (TYLENOL) 500 MG tablet Take 1,000 mg by mouth every 6 (six) hours as needed for moderate pain or headache.    [provider]  aspirin 325 MG tablet Take 1 tablet (325 mg total) by mouth daily. Patient taking differently: Take 325 mg by mouth every evening.  01/04/19   Fritzi Mandes, MD  atorvastatin (LIPITOR) 40 MG tablet Take 1 tablet (40 mg total) by mouth daily at 6 PM. 02/27/19   Mikey College, NP  cholecalciferol (VITAMIN D3) 25 MCG (1000 UT) tablet Take 1,000 Units by mouth at bedtime.    [provider]  cyanocobalamin (,VITAMIN B-12,) 1000 MCG/ML injection  Inject 1 mL (1,000 mcg total) into the muscle every 30 (thirty) days for 6 doses. Patient taking differently: Inject 1,000 mcg into the muscle every 7 (seven) days.  06/11/19 11/09/19  Mikey College, NP  cyclobenzaprine (FLEXERIL) 10 MG tablet Take 0.5-1 tablets (5-10 mg total) by mouth 3 (three) times daily as needed for muscle spasms. 12/26/18   Karamalegos, Devonne Doughty, DO  DM-APAP-CPM (CORICIDIN HBP PO) Take 2 tablets by mouth daily as needed (allergies).    [provider]  hydrOXYzine (ATARAX/VISTARIL) 10 MG tablet Take 1-2 tablets (10-20 mg total) by mouth 3 (three) times daily as needed for anxiety (insomnia). Patient taking differently: Take 10 mg by mouth 3 (three) times daily as needed for anxiety (insomnia).  03/03/19   Mikey College, NP  meclizine (ANTIVERT) 25 MG tablet Take 1 tablet (25 mg total) by mouth 3 (three) times daily as needed for dizziness. Patient not taking: Reported on 05/31/2019 03/03/19   Mikey College, NP  omeprazole (PRILOSEC OTC) 20 MG tablet Take 20-40 mg by mouth daily as needed (acid reflux).    [provider]  predniSONE (DELTASONE) 20 MG tablet Take 1 tablet (20 mg total) by mouth daily. 07/24/19   Norval Gable, MD  triamcinolone (NASACORT ALLERGY 24HR) 55 MCG/ACT AERO nasal inhaler Place 1 spray into the nose daily as needed (allergies).    [provider]  vitamin B-12 (CYANOCOBALAMIN) 1000 MCG tablet Take 1,000 mcg by mouth at bedtime.    [provider]    Family History Family History  Problem Relation Age of Onset  . Cancer Father        renal/pancreatic  . CAD Father   . Prostate cancer Maternal Grandfather   . Bladder Cancer Neg Hx     Social History Social History   Tobacco Use  . Smoking status: Never Smoker  . Smokeless tobacco: Former Systems developer    Types: Chew  . Tobacco comment: dip  Substance Use Topics  . Alcohol use: Yes    Comment: occasionally  . Drug use: No      Allergies   Penicillins   Review of Systems Review of Systems   Physical Exam Triage Vital Signs ED Triage Vitals  Enc Vitals Group     BP 07/24/19 1806 (!) 147/86     Pulse Rate 07/24/19 1806 76     Resp 07/24/19 1806 17     Temp 07/24/19 1806 98.4 F (36.9 C)     Temp Source 07/24/19 1806 Oral     SpO2 07/24/19 1806 97 %     Weight 07/24/19 1809 265 lb (120.2 kg)     Height 07/24/19 1809 6\' 2"  (1.88 m)     Head Circumference --      Peak Flow --      Pain Score 07/24/19 1809 0     Pain Loc --      Pain Edu? --      Excl. in [place]? --    No data found.  Updated Vital Signs BP (!) 147/86 (BP Location: Right Arm)   Pulse 76   Temp 98.4 F (36.9 C) (Oral)   Resp 17   Ht 6\' 2"  (1.88 m)   Wt 120.2 kg   SpO2 97%   BMI 34.02 kg/m   Visual Acuity Right Eye Distance:   Left Eye Distance:   Bilateral Distance:    Right Eye Near:   Left Eye Near:    Bilateral Near:     Physical Exam Vitals and nursing note reviewed.  Constitutional:      General: He is not in acute distress.    Appearance: He is not toxic-appearing or diaphoretic.  HENT:     Head:     Jaw: Tenderness (left TMJ joint) present.     Right Ear: Tympanic membrane normal.     Left Ear: Tympanic membrane normal.     Mouth/Throat:     Pharynx: Posterior oropharyngeal erythema present. No oropharyngeal exudate.  Pulmonary:     Effort: Pulmonary effort is normal. No respiratory distress.  Musculoskeletal:     Cervical back: Neck supple.  Neurological:     Mental Status: He is alert.      UC Treatments / Results  Labs (all labs ordered are listed, but only abnormal results are displayed) Labs Reviewed  NOVEL CORONAVIRUS, NAA (HOSP ORDER, SEND-OUT TO REF LAB; TAT 18-24 HRS)    EKG   Radiology No results found.  Procedures Procedures (including critical care time)  Medications Ordered in UC Medications - No data to display  Initial Impression / Assessment and Plan / UC Course   I have reviewed the triage vital signs and the nursing notes.  Pertinent labs & imaging results that were available during my care of the patient were reviewed by me and considered in my medical decision making (see chart for details).  Final Clinical Impressions(s) / UC Diagnoses   Final diagnoses:  TMJ syndrome  Close exposure to COVID-19 virus     Discharge Instructions     Ice, discontinue gum chewing, tylenol as needed    ED Prescriptions    Medication Sig Dispense Auth. Provider   predniSONE (DELTASONE) 20 MG tablet Take 1 tablet (20 mg total) by mouth daily. 7 tablet Norval Gable, MD     1. diagnosis reviewed with patient 2. rx as per orders above; reviewed possible side effects, interactions, risks and benefits  3. Recommend supportive treatment as above 4. Follow-up prn if symptoms worsen or don't improve  PDMP not reviewed this encounter.   Norval Gable, MD 07/26/19 1943

## 2019-08-09 ENCOUNTER — Other Ambulatory Visit: Payer: Self-pay

## 2019-08-09 ENCOUNTER — Ambulatory Visit (INDEPENDENT_AMBULATORY_CARE_PROVIDER_SITE_OTHER): Payer: BC Managed Care – PPO

## 2019-08-09 DIAGNOSIS — E538 Deficiency of other specified B group vitamins: Secondary | ICD-10-CM

## 2019-08-09 DIAGNOSIS — Z23 Encounter for immunization: Secondary | ICD-10-CM

## 2019-08-09 MED ORDER — CYANOCOBALAMIN 1000 MCG/ML IJ SOLN
1000.0000 ug | Freq: Once | INTRAMUSCULAR | Status: AC
  Administered 2019-08-09: 1000 ug via INTRAMUSCULAR

## 2019-08-09 MED ORDER — CYANOCOBALAMIN 1000 MCG/ML IJ SOLN: 1000.0000 ug | Freq: Once | INTRAMUSCULAR | 0 refills | Status: AC

## 2019-08-22 ENCOUNTER — Ambulatory Visit
Admission: EM | Admit: 2019-08-22 | Discharge: 2019-08-22 | Disposition: A | Discharge status: Home / Self Care | Payer: BC Managed Care – PPO | Attending: Family Medicine | Admitting: Family Medicine

## 2019-08-22 ENCOUNTER — Other Ambulatory Visit: Payer: Self-pay

## 2019-08-22 ENCOUNTER — Encounter: Payer: Self-pay | Admitting: Emergency Medicine

## 2019-08-22 DIAGNOSIS — Z79899 Other long term (current) drug therapy: Secondary | ICD-10-CM | POA: Insufficient documentation

## 2019-08-22 DIAGNOSIS — Z7189 Other specified counseling: Secondary | ICD-10-CM

## 2019-08-22 DIAGNOSIS — Z87891 Personal history of nicotine dependence: Secondary | ICD-10-CM | POA: Diagnosis not present

## 2019-08-22 DIAGNOSIS — Z20822 Contact with and (suspected) exposure to covid-19: Secondary | ICD-10-CM | POA: Insufficient documentation

## 2019-08-22 DIAGNOSIS — Z8673 Personal history of transient ischemic attack (TIA), and cerebral infarction without residual deficits: Secondary | ICD-10-CM | POA: Insufficient documentation

## 2019-08-22 DIAGNOSIS — R519 Headache, unspecified: Secondary | ICD-10-CM

## 2019-08-22 DIAGNOSIS — Z7982 Long term (current) use of aspirin: Secondary | ICD-10-CM | POA: Insufficient documentation

## 2019-08-22 DIAGNOSIS — R0981 Nasal congestion: Secondary | ICD-10-CM | POA: Diagnosis not present

## 2019-08-22 NOTE — ED Triage Notes (Signed)
Patient exposed to [place] on Saturday. Started having a headache yesterday. Denies any other symptoms.

## 2019-08-22 NOTE — ED Provider Notes (Addendum)
[facility] ____________________________________________  Time seen: Approximately 2:15 PM  I have reviewed the triage vital signs and the nursing notes.   HISTORY  Chief Complaint COVID test and Headache   HPI Randy Watson is a 59 y.o. male presenting for COVID-19 testing. Patient reports he and his wife are both exposed to a young adult this past Saturday from being in the same household. States today he does have mild nasal congestion and a mild headache. States headache is mild. Denies cough, fevers, sore throat, chest pain, shortness of breath. Denies change in taste or smell. Denies severe headache, paresthesias, unilateral weakness, vision changes, unsteady gait or confusion. Denies aggravating or alleviating factors. Reports otherwise doing well. No recent sickness.  Olin Hauser, DO : PCP    Past Medical History:  Diagnosis Date  . Allergy   . Anal fissure   . Constipation   . GERD (gastroesophageal reflux disease)   . History of chicken pox   . Obesity   . Stroke [facility]) 12/2018    Patient Active Problem List   Diagnosis Date Noted  . Dyslipidemia 04/12/2019  . 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 History of cerebrovascular accident (CVA) with residual deficit 01/10/2019  . Acute CVA (cerebrovascular accident) ([place]) 01/02/2019    Past Surgical History:  Procedure Laterality Date  . TEE WITHOUT CARDIOVERSION N/A 05/31/2019   Procedure: TRANSESOPHAGEAL ECHOCARDIOGRAM (TEE);  Surgeon: Corey Skains, MD;  Location: [facility] ORS;  Service: Cardiovascular;  Laterality: N/A;     No current facility-administered medications for this encounter.  Current Outpatient Medications:  .  acetaminophen (TYLENOL) 500 MG tablet, Take 1,000 mg by mouth every 6 (six) hours as needed for moderate pain or headache., Disp: , Rfl:  .  aspirin 325 MG tablet, Take 1 tablet (325 mg total) by mouth daily. (Patient taking differently: Take 325  mg by mouth every evening. ), Disp: 30 tablet, Rfl: 1 .  atorvastatin (LIPITOR) 40 MG tablet, Take 1 tablet (40 mg total) by mouth daily at 6 PM., Disp: 90 tablet, Rfl: 1 .  cholecalciferol (VITAMIN D3) 25 MCG (1000 UT) tablet, Take 1,000 Units by mouth at bedtime., Disp: , Rfl:  .  cyanocobalamin (,VITAMIN B-12,) 1000 MCG/ML injection, Inject 1 mL (1,000 mcg total) into the muscle every 30 (thirty) days for 6 doses. (Patient taking differently: Inject 1,000 mcg into the muscle every 7 (seven) days. ), Disp: 1 mL, Rfl: 5 .  cyclobenzaprine (FLEXERIL) 10 MG tablet, Take 0.5-1 tablets (5-10 mg total) by mouth 3 (three) times daily as needed for muscle spasms., Disp: 30 tablet, Rfl: 0 .  DM-APAP-CPM (CORICIDIN HBP PO), Take 2 tablets by mouth daily as needed (allergies)., Disp: , Rfl:  .  omeprazole (PRILOSEC OTC) 20 MG tablet, Take 20-40 mg by mouth daily as needed (acid reflux)., Disp: , Rfl:  .  vitamin B-12 (CYANOCOBALAMIN) 1000 MCG tablet, Take 1,000 mcg by mouth at bedtime., Disp: , Rfl:   Allergies Penicillins  Family History  Problem Relation Age of Onset  . Cancer Father        renal/pancreatic  . CAD Father   . Prostate cancer Maternal Grandfather   . Bladder Cancer Neg Hx     Social History Social History   Tobacco Use  . Smoking status: Never Smoker  . Smokeless tobacco: Former Systems developer    Types: Chew  . Tobacco comment: dip  Substance Use Topics  . Alcohol use: Yes    Comment:  occasionally  . Drug use: No    Review of Systems Constitutional: No fever.  Eyes: No visual changes. ENT: No sore throat. Cardiovascular: Denies chest pain. Respiratory: Denies shortness of breath. Gastrointestinal: No abdominal pain.  No nausea, no vomiting.  No diarrhea.   Skin: Negative for rash. Neurological: Positive for headaches. Negative focal weakness or numbness.   ____________________________________________   PHYSICAL EXAM:  VITAL SIGNS: ED Triage Vitals  Enc Vitals Group       BP 08/22/19 1340 (!) 141/91     Pulse Rate 08/22/19 1340 77     Resp 08/22/19 1340 18     Temp 08/22/19 1340 98.4 F (36.9 C)     Temp Source 08/22/19 1340 Oral     SpO2 08/22/19 1340 96 %     Weight 08/22/19 1337 280 lb (127 kg)     Height 08/22/19 1337 6\' 2"  (1.88 m)     Head Circumference --      Peak Flow --      Pain Score 08/22/19 1336 0     Pain Loc --      Pain Edu? --      Excl. in [place]? --     Constitutional: Alert and oriented. Well appearing and in no acute distress. Eyes: Conjunctivae are normal. PERRL. EOMI. ENT      Head: Normocephalic and atraumatic.      Ears: Nontender, normal canal, no erythema, normal TM bilaterally.      Nose: Mild nasal congestion.       Mouth/Throat: Mucous membranes are moist.Oropharynx non-erythematous. Neck: No stridor. Supple without meningismus.  Hematological/Lymphatic/Immunilogical: No cervical lymphadenopathy. Cardiovascular: Normal rate, regular rhythm. Grossly normal heart sounds.  Good peripheral circulation. Respiratory: Normal respiratory effort without tachypnea nor retractions. Breath sounds are clear and equal bilaterally. No wheezes, rales, rhonchi. Musculoskeletal:Steady gait.  Neurologic:  Normal speech and language. No gross focal neurologic deficits are appreciated. Speech is normal. No gait instability.  No ataxia.  Normal finger-nose.  Negative Romberg. Skin:  Skin is warm, dry and intact. No rash noted. Psychiatric: Mood and affect are normal. Speech and behavior are normal. Patient exhibits appropriate insight and judgment   ___________________________________________   LABS (all labs ordered are listed, but only abnormal results are displayed)  Labs Reviewed  NOVEL CORONAVIRUS, NAA (HOSP ORDER, SEND-OUT TO REF LAB; TAT 18-24 HRS)     PROCEDURES Procedures    INITIAL IMPRESSION / ASSESSMENT AND PLAN / ED COURSE  Pertinent labs & imaging results that were available during my care of the patient were  reviewed by me and considered in my medical decision making (see chart for details).  Very well-appearing patient.  No acute distress.  COVID-19 testing completed advice given.  Monitor symptoms.  Supportive care.  Discussed her follow-up and return parameters.  Work note given.  Discussed follow up and return parameters including no resolution or any worsening concerns. Patient verbalized understanding and agreed to plan.   ____________________________________________   FINAL CLINICAL IMPRESSION(S) / ED DIAGNOSES  Final diagnoses:  Encounter for screening laboratory testing for COVID-19 virus  Advice given about COVID-19 virus infection     ED Discharge Orders    None       Note: This dictation was prepared with Dragon dictation along with smaller phrase technology. Any transcriptional errors that result from this process are unintentional.         Marylene Land, NP 08/22/19 1539

## 2019-08-24 LAB — NOVEL CORONAVIRUS, NAA (HOSP ORDER, SEND-OUT TO REF LAB; TAT 18-24 HRS): SARS-CoV-2, NAA: NOT DETECTED

## 2019-08-27 ENCOUNTER — Other Ambulatory Visit: Payer: Self-pay | Admitting: Nurse Practitioner

## 2019-08-27 DIAGNOSIS — Z8673 Personal history of transient ischemic attack (TIA), and cerebral infarction without residual deficits: Secondary | ICD-10-CM

## 2019-10-10 ENCOUNTER — Ambulatory Visit: Payer: BC Managed Care – PPO | Admitting: Family Medicine

## 2019-10-11 ENCOUNTER — Encounter: Payer: Self-pay | Admitting: Family Medicine

## 2019-10-11 ENCOUNTER — Other Ambulatory Visit: Payer: Self-pay

## 2019-10-11 ENCOUNTER — Ambulatory Visit (INDEPENDENT_AMBULATORY_CARE_PROVIDER_SITE_OTHER): Payer: BC Managed Care – PPO | Admitting: Family Medicine

## 2019-10-11 VITALS — BP 123/68 | HR 93 | Temp 98.4°F | Resp 16 | Ht 74.0 in | Wt 287.0 lb

## 2019-10-11 DIAGNOSIS — H6982 Other specified disorders of Eustachian tube, left ear: Secondary | ICD-10-CM | POA: Diagnosis not present

## 2019-10-11 DIAGNOSIS — H6122 Impacted cerumen, left ear: Secondary | ICD-10-CM

## 2019-10-11 DIAGNOSIS — Z1211 Encounter for screening for malignant neoplasm of colon: Secondary | ICD-10-CM

## 2019-10-11 NOTE — Patient Instructions (Addendum)
Thank you for coming to the office today.  You have some Eustachian Tube Dysfunction, this problem is usually caused by some deeper sinus swelling and pressure, causing difficulty of eustachian tubes to clear fluid from behind ear drum. You can have ear pain, pressure, fullness, loss of hearing. Often related to sinus symptoms and sometimes with sinusitis or infection or allergy symptoms.  Treatment: - Start using nasal steroid spray, Flonase 2 sprays in each nostril every day for at least 4-6 weeks, and maybe longer - OTC allergy medicine (Claritin, Zyrtec, or Allegra - or generics) once daily - For significant ear/sinus pressure, try OTC decongestant such as Phenylephrine or similar medicines, included in DayQuil, take this for up to 1 week or less, no longer  If any significant worsening, loss of hearing, constant pain, fever/chills, or concern for infection - notify office and we can send in an antibiotic. Or if just persistent pressure that is not improving, you may contact me back within 1 week and we can consider a brief course of oral steroid prednisone for 3 days only to help reduce swelling, as discussed this is not ideal treatment and can cause side effects.   Colon Cancer Screening: - For all adults age 55+ routine colon cancer screening is highly recommended.     - Recent guidelines from [place] recommend starting age of 62 - Early detection of colon cancer is important, because often there are no warning signs or symptoms, also if found early usually it can be cured. Late stage is hard to treat.  - If you are not interested in Colonoscopy screening (if done and normal you could be cleared for 5 to 10 years until next due), then Cologuard is an excellent alternative for screening test for Colon Cancer. It is highly sensitive for detecting DNA of colon cancer from even the earliest stages. Also, there is NO bowel prep required. - If Cologuard is NEGATIVE, then it is good  for 3 years before next due - If Cologuard is POSITIVE, then it is strongly advised to get a Colonoscopy, which allows the GI doctor to locate the source of the cancer or polyp (even very early stage) and treat it by removing it. ------------------------- If you would like to proceed with Cologuard (stool DNA test) - FIRST, call your insurance company and tell them you want to check cost of Cologuard tell them CPT Code [phone] (it may be completely covered and you could get for no cost, OR max cost without any coverage is about $600). Also, keep in mind if you do NOT open the kit, and decide not to do the test, you will NOT be charged, you should contact the company if you decide not to do the test. - If you want to proceed, you can notify us (phone message, [place], or at next visit) and we will order it for you. The test kit will be delivered to you house within about 1 week. Follow instructions to collect sample, you may call the company for any help or questions, 24/7 telephone support at [phone].  ------------  DUE for FASTING BLOOD WORK (no food or drink after midnight before the lab appointment, only water or coffee without cream/sugar on the morning of)  SCHEDULE "Lab Only" visit in the morning at the clinic for lab draw in 2 MONTHS   - Make sure Lab Only appointment is at about 1 week before your next appointment, so that results will be available  For Lab  Results, once available within 2-3 days of blood draw, you can can log in to MyChart online to view your results and a brief explanation. Also, we can discuss results at next follow-up visit.   Please schedule a Follow-up Appointment to: Return in about 2 months (around 12/11/2019) for 2 month Follow-up HLD, Vitamin D - AM fasting, lab after apt.  If you have any other questions or concerns, please feel free to call the office or send a message through [place]. You may also schedule an earlier appointment if  necessary.  Additionally, you may be receiving a survey about your experience at our office within a few days to 1 week by e-mail or mail. We value your feedback.  [name], DO [place]

## 2019-10-11 NOTE — Progress Notes (Signed)
Subjective:    Patient ID: Randy Watson, male    DOB: [birth year], 60 y.o.   MRN: [id number]  Randy Watson is a 59 y.o. male presenting on 10/11/2019 for Hyperlipidemia   HPI   Vitamin D deficiency Had lab recently had low lab lvl vit D, he was placed on Vitamin D 50,000 weekly for 8 weeks. Then to Vitamin D 1k daily  History of CVA / Headaches / OSA CPAP Per Neurology, he was reduced from ASA 325 down to 81 Also followed by Cardiology  Pressure behind ears, L>R Reports ear pressure, some affected hearing. Request wax removal. Has flonase but not taking it. Admits some sinus pressure Denies sinus pain, cough fever chills  History COVID19 >3 weeks ago. He has recovered and completed his quarantine. He still has some mild loss of smell and residual headache. Asking about COVID vaccine, antibodies   Health Maintenance: Colon CA Screening: Never had colonoscopy/ Currently asymptomatic. No known family history of colon CA. Due for screening test interested in Cologuard, counseling given   Depression screen [facility] 2/9 10/11/2019 03/03/2019 01/10/2019  Decreased Interest 0 0 0  Down, Depressed, Hopeless 0 0 0  PHQ - 2 Score 0 0 0  Altered sleeping - 1 -  Tired, decreased energy - 1 -  Change in appetite - 0 -  Feeling bad or failure about yourself  - 0 -  Trouble concentrating - 1 -  Moving slowly or fidgety/restless - 0 -  Suicidal thoughts - 0 -  PHQ-9 Score - 3 -    Social History   Tobacco Use  . Smoking status: Never Smoker  . Smokeless tobacco: Former Systems developer    Types: Chew  . Tobacco comment: dip  Substance Use Topics  . Alcohol use: Yes    Comment: occasionally  . Drug use: No    Review of Systems Per HPI unless specifically indicated above     Objective:    BP 123/68   Pulse 93   Temp 98.4 F (36.9 C) (Temporal)   Resp 16   Ht 6\' 2"  (1.88 m)   Wt 287 lb (130.2 kg)   BMI 36.85 kg/m   Wt Readings from Last 3 Encounters:  10/11/19 287 lb (130.2  kg)  08/22/19 280 lb (127 kg)  07/24/19 265 lb (120.2 kg)    Physical Exam Vitals and nursing note reviewed.  Constitutional:      General: He is not in acute distress.    Appearance: He is well-developed. He is not diaphoretic.     Comments: Well-appearing, comfortable, cooperative  HENT:     Head: Normocephalic and atraumatic.     Right Ear: Ear canal and external ear normal.     Left Ear: Ear canal and external ear normal.     Ears:     Comments: R TM normal  L TM slightly full with effusion no erythema. moderate large cerumen non impacted. Eyes:     General:        Right eye: No discharge.        Left eye: No discharge.     Conjunctiva/sclera: Conjunctivae normal.  Neck:     Thyroid: No thyromegaly.  Cardiovascular:     Rate and Rhythm: Normal rate and regular rhythm.     Heart sounds: Normal heart sounds. No murmur.  Pulmonary:     Effort: Pulmonary effort is normal. No respiratory distress.     Breath sounds: Normal breath sounds. No wheezing  or rales.  Musculoskeletal:        General: Normal range of motion.     Cervical back: Normal range of motion and neck supple.  Lymphadenopathy:     Cervical: No cervical adenopathy.  Skin:    General: Skin is warm and dry.     Findings: No erythema or rash.  Neurological:     Mental Status: He is alert and oriented to person, place, and time.  Psychiatric:        Behavior: Behavior normal.     Comments: Well groomed, good eye contact, normal speech and thoughts    ________________________________________________________ PROCEDURE NOTE Date: 10/11/19 Left Ear Lavage / Cerumen Removal Discussed benefits and risks (including pain / discomforts, dizziness, minor abrasion of ear canal). Verbal consent given by patient. Medication:  carbamide peroxide ear drops, Ear Lavage Solution (warm water + hydrogen peroxide) Performed by Dr Parks Ranger / Donnie Mesa CMA Several drops of carbamide peroxide placed in L ear, allowed to  sit for few minutes. Ear lavage solution flushed into L ear in attempt to dislodge and remove ear wax.  Results were successful  Repeat Ear Exam: - Completely removed cerumen now, with clear ear canals and visible TMs clear and normal appearance.    Results for orders placed or performed during the hospital encounter of 08/22/19  Novel Coronavirus, NAA (Hosp order, Send-out to Ref Lab; TAT 18-24 hrs   Specimen: Nasopharyngeal Swab; Respiratory  Result Value Ref Range   SARS-CoV-2, NAA NOT DETECTED NOT DETECTED   Coronavirus Source NASOPHARYNGEAL       Assessment & Plan:   Problem List Items Addressed This Visit    None    Visit Diagnoses    Eustachian tube dysfunction, left    -  Primary   Relevant Medications   fluticasone (FLONASE) 50 MCG/ACT nasal spray   Screening for colon cancer       Relevant Orders   Cologuard   Left ear impacted cerumen          #Left ear cerumen S/p lavage removed cerumen  #L Eustachian tube dysfunction Start nasal steroid Flonase 2 sprays in each nostril daily for 4-6 weeks, may repeat course seasonally or as needed  #Screening Colon CA Ordered cologuard. Counseling provided  #Vit D deficiency Reviewed dosing per neuro, supplement after  No orders of the defined types were placed in this encounter.     Follow up plan: Return in about 2 months (around 12/11/2019) for 2 month Follow-up HLD, Vitamin D - AM fasting, lab after apt.  Future labs ordered at time of visit in 2 months. Include Vitamin D   Randy Putnam, DO [facility] 10/11/2019, 2:07 PM

## 2019-10-24 LAB — COLOGUARD: Cologuard: NEGATIVE

## 2019-10-26 ENCOUNTER — Encounter: Payer: Self-pay | Admitting: Family Medicine

## 2019-10-26 LAB — COLOGUARD: COLOGUARD: NEGATIVE

## 2019-10-27 ENCOUNTER — Encounter: Payer: Self-pay | Admitting: Family Medicine

## 2019-12-05 DIAGNOSIS — Z8673 Personal history of transient ischemic attack (TIA), and cerebral infarction without residual deficits: Secondary | ICD-10-CM

## 2019-12-07 MED ORDER — ATORVASTATIN CALCIUM 40 MG PO TABS: 40.0000 mg | ORAL_TABLET | Freq: Every day | ORAL | 1 refills | Status: DC

## 2019-12-14 ENCOUNTER — Ambulatory Visit (INDEPENDENT_AMBULATORY_CARE_PROVIDER_SITE_OTHER): Payer: BC Managed Care – PPO | Admitting: Family Medicine

## 2019-12-14 ENCOUNTER — Encounter: Payer: Self-pay | Admitting: Family Medicine

## 2019-12-14 ENCOUNTER — Other Ambulatory Visit: Payer: Self-pay

## 2019-12-14 VITALS — BP 133/86 | HR 87 | Temp 98.0°F | Resp 16 | Ht 74.0 in | Wt 282.0 lb

## 2019-12-14 DIAGNOSIS — E538 Deficiency of other specified B group vitamins: Secondary | ICD-10-CM | POA: Insufficient documentation

## 2019-12-14 DIAGNOSIS — M545 Low back pain, unspecified: Secondary | ICD-10-CM

## 2019-12-14 DIAGNOSIS — E559 Vitamin D deficiency, unspecified: Secondary | ICD-10-CM | POA: Diagnosis not present

## 2019-12-14 DIAGNOSIS — E785 Hyperlipidemia, unspecified: Secondary | ICD-10-CM

## 2019-12-14 DIAGNOSIS — E1169 Type 2 diabetes mellitus with other specified complication: Secondary | ICD-10-CM | POA: Insufficient documentation

## 2019-12-14 DIAGNOSIS — M47816 Spondylosis without myelopathy or radiculopathy, lumbar region: Secondary | ICD-10-CM

## 2019-12-14 DIAGNOSIS — R7309 Other abnormal glucose: Secondary | ICD-10-CM | POA: Insufficient documentation

## 2019-12-14 DIAGNOSIS — G8929 Other chronic pain: Secondary | ICD-10-CM

## 2019-12-14 NOTE — Patient Instructions (Addendum)
Thank you for coming to the office today.  Labs today, stay tuned for results.  Keep on current medicines for back. As long as gradual improvement, we should be good.  Recommend to start taking Tylenol Extra Strength 500mg  tabs - take 1 to 2 tabs per dose (max 1000mg ) every 6-8 hours for pain (take regularly, don't skip a dose for next 7 days), max 24 hour daily dose is 6 tablets or 3000mg . In the future you can repeat the same everyday Tylenol course for 1-2 weeks at a time.   Muscle rubs, topical relief, heating / ice  If need a new refill on Flexeril or want to change muscle relaxant let me know.  Consider a new medicine like a Gabapentin, let me know if interested, low dose build up and can help with daily pain.  If not improving, radiating nerve pain or worse pain, notify me sooner, can order X-ray vs referral to Physical Therapy or Orthopedics  [facility] can go whenever you like.   Please schedule a Follow-up Appointment to: Return in about 6 weeks (around 01/25/2020), or if symptoms worsen or fail to improve, for 4-6 weeks back pain if not improved.  If you have any other questions or concerns, please feel free to call the office or send a message through [place]. You may also schedule an earlier appointment if necessary.  Additionally, you may be receiving a survey about your experience at our office within a few days to 1 week by e-mail or mail. We value your feedback.  [name], DO [place]

## 2019-12-14 NOTE — Progress Notes (Signed)
Subjective:    Patient ID: Randy Watson, male    DOB: 06-25-1961, 59 y.o.   MRN: [id number]  Randy Watson is a 59 y.o. male presenting on 12/14/2019 for Hyperlipidemia   HPI  Lumbar DDD / Chronic Back Pain Recent course 3-4 weeks gradual worsening low back pain, seen by Urgent [facility] 12/04/19, dx with a lumbar disc issue causing pain, treated with Prednisone burst 5 days, and Hydrocodone PRN and Flexeril PRN. - Last similar flare was about 1 year ago. Has had similar mild flares but this is more severe. - Working with Chiropractor (Dr Charm Barges), prior X-rays lumbar spine, DJD with bone spurs 2 years ago approximately, no new x-rays. - History of prior bulging disc 20 years ago, did PT for that - Wearing a back support harness/brace with some good results. - He has used [facility] before would request them if needed - Currently not followed by Ortho. He is showing some improvement now overall on medicines and activity modification Denies any significant radiating pain or neuropathy, numbness tingling, fall trauma  HYPERLIPIDEMIA: - Reports no concerns. Last lipid panel 2020, now due for lab - Currently taking Atorvastatin 40mg , tolerating well without side effects or myalgias  Vitamin D deficiency Last lab Vitamin D 19.9 (08/2019) On vitamin D supplement, 50k weekly completed. Now on Vitamin D3 1k daily  Vitamin B12 deficiency On Vitamin B12 1k daily  Elevated A1c Last result 5.7, in 12/2018 Due for lab   Depression screen [facility] 2/9 12/14/2019 10/11/2019 03/03/2019  Decreased Interest 0 0 0  Down, Depressed, Hopeless 0 0 0  PHQ - 2 Score 0 0 0  Altered sleeping - - 1  Tired, decreased energy - - 1  Change in appetite - - 0  Feeling bad or failure about yourself  - - 0  Trouble concentrating - - 1  Moving slowly or fidgety/restless - - 0  Suicidal thoughts - - 0  PHQ-9 Score - - 3    Social History   Tobacco Use  . Smoking status: Never Smoker  .  Smokeless tobacco: Former Systems developer    Types: Chew  . Tobacco comment: dip  Substance Use Topics  . Alcohol use: Yes    Comment: occasionally  . Drug use: No    Review of Systems Per HPI unless specifically indicated above     Objective:    BP 133/86   Pulse 87   Temp 98 F (36.7 C) (Oral)   Resp 16   Ht 6\' 2"  (1.88 m)   Wt 282 lb (127.9 kg)   SpO2 96%   BMI 36.21 kg/m   Wt Readings from Last 3 Encounters:  12/14/19 282 lb (127.9 kg)  10/11/19 287 lb (130.2 kg)  08/22/19 280 lb (127 kg)    Physical Exam Vitals and nursing note reviewed.  Constitutional:      General: He is not in acute distress.    Appearance: He is well-developed. He is not diaphoretic.     Comments: Well-appearing, comfortable, cooperative  HENT:     Head: Normocephalic and atraumatic.  Eyes:     General:        Right eye: No discharge.        Left eye: No discharge.     Conjunctiva/sclera: Conjunctivae normal.  Cardiovascular:     Rate and Rhythm: Normal rate.  Pulmonary:     Effort: Pulmonary effort is normal.  Musculoskeletal:     Comments: Low  Back Inspection: Normal appearance, no spinal deformity, symmetrical. Palpation: No tenderness over spinous processes. Bilateral lumbar paraspinal muscles mild tender and with some hypertonicity/spasm. ROM: Full active ROM forward flex / back extension, rotation L/R without discomfort Special Testing: Seated SLR negative for radicular pain bilaterally  Strength: Bilateral hip flex/ext 5/5, knee flex/ext 5/5, ankle dorsiflex/plantarflex 5/5 Neurovascular: intact distal sensation to light touch   Skin:    General: Skin is warm and dry.     Findings: No erythema or rash.  Neurological:     Mental Status: He is alert and oriented to person, place, and time.  Psychiatric:        Behavior: Behavior normal.     Comments: Well groomed, good eye contact, normal speech and thoughts       Results for orders placed or performed in visit on 10/26/19    Cologuard  Result Value Ref Range   Cologuard Negative Negative      Assessment & Plan:   Problem List Items Addressed This Visit    Vitamin D deficiency   Relevant Orders   VITAMIN D 25 Hydroxy (Vit-D Deficiency, Fractures)   Vitamin B12 deficiency   Relevant Orders   Vitamin B12   Spondylosis of lumbar region without myelopathy or radiculopathy   Relevant Medications   HYDROcodone-acetaminophen (NORCO/VICODIN) 5-325 MG tablet   Other Relevant Orders   COMPLETE METABOLIC PANEL WITH GFR   Elevated hemoglobin A1c   Relevant Orders   COMPLETE METABOLIC PANEL WITH GFR   Hemoglobin A1c   Dyslipidemia   Relevant Orders   Lipid panel   Chronic bilateral low back pain without sciatica - Primary   Relevant Medications   HYDROcodone-acetaminophen (NORCO/VICODIN) 5-325 MG tablet     #Vitamin D, B12 Check labs for vitamin Deficiency given previous abnormal lab results On supplements currently Vit D3 1k and B12 1k daily, finished therapeutic dose D3  #A1c Elevated Check lab today A1c, previously borderline pre-diabetic range Encourage lifestyle improvement, check lab, f/u  #Dyslipidemia Previously controlled, last lipid in 2020 Due for lab Continue Atorva 40mg  daily for now F/u results  #Back pain, chronic, OA/DJD Subacute on chronic bilateral LBP without associated sciatica. Suspect likely due to muscle spasm/strain, without known injury or trauma.  In setting of known chronic LBP with DJD - No red flag symptoms. Negative SLR for radiculopathy  Plan: 1. Continue current course, avoid NSAID, should take high dose Tylenol, already has Hydrocodone PRN from Urgent Care, can continue Flexeril if need to change muscle relaxant notify office, we can switch to baclofen if need, may offer 1 more refill hydrocodone temporarily but no long term discussed this. He already finished prednisone would not repeat at this time. - Defer Back x-ray already done by Chiropractor within past 1-2  years  Handwritten [facility] order given to patient if interested  Follow-up 4-6 weeks if not improved for re-evaluation, consider X-ray imaging and possibly referral to Orthopedic    No orders of the defined types were placed in this encounter.   Follow up plan: Return in about 6 weeks (around 01/25/2020), or if symptoms worsen or fail to improve, for 4-6 weeks back pain if not improved.   [name], DO [facility] 12/14/2019, 8:16 AM

## 2019-12-15 LAB — LIPID PANEL
Cholesterol: 114 mg/dL (ref <200)
HDL: 48 mg/dL (ref >=40)
LDL Cholesterol (Calc): 50 mg/dL (calc)
Non-HDL Cholesterol (Calc): 66 mg/dL (calc) (ref <130)
Total CHOL/HDL Ratio: 2.4 (calc) (ref <5.0)
Triglycerides: 82 mg/dL (ref <150)

## 2019-12-15 LAB — COMPLETE METABOLIC PANEL WITH GFR
AG Ratio: 1.6 (calc) (ref 1.0–2.5)
ALT: 44 U/L (ref 9–46)
AST: 21 U/L (ref 10–35)
Albumin: 4.1 g/dL (ref 3.6–5.1)
Alkaline phosphatase (APISO): 99 U/L (ref 35–144)
BUN: 16 mg/dL (ref 7–25)
CO2: 26 mmol/L (ref 20–32)
Calcium: 9.1 mg/dL (ref 8.6–10.3)
Chloride: 103 mmol/L (ref 98–110)
Creat: 0.88 mg/dL (ref 0.70–1.33)
GFR, Est African American: 109 mL/min/{1.73_m2} (ref >=60)
GFR, Est Non African American: 94 mL/min/{1.73_m2} (ref >=60)
Globulin: 2.5 g/dL (calc) (ref 1.9–3.7)
Glucose, Bld: 157 mg/dL — ABNORMAL HIGH (ref 65–99)
Potassium: 4.2 mmol/L (ref 3.5–5.3)
Sodium: 138 mmol/L (ref 135–146)
Total Bilirubin: 0.6 mg/dL (ref 0.2–1.2)
Total Protein: 6.6 g/dL (ref 6.1–8.1)

## 2019-12-15 LAB — HEMOGLOBIN A1C
Hgb A1c MFr Bld: 6.3 % of total Hgb — ABNORMAL HIGH (ref <5.7)
Mean Plasma Glucose: 134 (calc)
eAG (mmol/L): 7.4 (calc)

## 2019-12-15 LAB — VITAMIN D 25 HYDROXY (VIT D DEFICIENCY, FRACTURES): Vit D, 25-Hydroxy: 24 ng/mL — ABNORMAL LOW (ref 30–100)

## 2019-12-15 LAB — VITAMIN B12: Vitamin B-12: 1190 pg/mL — ABNORMAL HIGH (ref 200–1100)

## 2020-09-04 ENCOUNTER — Other Ambulatory Visit: Payer: Self-pay | Admitting: Family Medicine

## 2020-09-04 DIAGNOSIS — Z8673 Personal history of transient ischemic attack (TIA), and cerebral infarction without residual deficits: Secondary | ICD-10-CM

## 2020-11-04 ENCOUNTER — Other Ambulatory Visit: Payer: BC Managed Care – PPO

## 2020-11-07 ENCOUNTER — Other Ambulatory Visit: Payer: Self-pay | Admitting: Family Medicine

## 2020-11-07 DIAGNOSIS — Z0000 Encounter for general adult medical examination without abnormal findings: Secondary | ICD-10-CM

## 2020-11-07 DIAGNOSIS — Z125 Encounter for screening for malignant neoplasm of prostate: Secondary | ICD-10-CM

## 2020-11-07 DIAGNOSIS — E538 Deficiency of other specified B group vitamins: Secondary | ICD-10-CM

## 2020-11-07 DIAGNOSIS — M545 Low back pain, unspecified: Secondary | ICD-10-CM

## 2020-11-07 DIAGNOSIS — G8929 Other chronic pain: Secondary | ICD-10-CM

## 2020-11-07 DIAGNOSIS — R7309 Other abnormal glucose: Secondary | ICD-10-CM

## 2020-11-07 DIAGNOSIS — E559 Vitamin D deficiency, unspecified: Secondary | ICD-10-CM

## 2020-11-07 DIAGNOSIS — E785 Hyperlipidemia, unspecified: Secondary | ICD-10-CM

## 2020-11-07 DIAGNOSIS — I6930 Unspecified sequelae of cerebral infarction: Secondary | ICD-10-CM

## 2020-11-08 ENCOUNTER — Other Ambulatory Visit: Payer: BC Managed Care – PPO

## 2020-11-08 ENCOUNTER — Other Ambulatory Visit: Payer: Self-pay

## 2020-11-09 LAB — COMPLETE METABOLIC PANEL WITH GFR
AG Ratio: 1.6 (calc) (ref 1.0–2.5)
ALT: 34 U/L (ref 9–46)
AST: 23 U/L (ref 10–35)
Albumin: 4.1 g/dL (ref 3.6–5.1)
Alkaline phosphatase (APISO): 95 U/L (ref 35–144)
BUN: 13 mg/dL (ref 7–25)
CO2: 25 mmol/L (ref 20–32)
Calcium: 9.4 mg/dL (ref 8.6–10.3)
Chloride: 106 mmol/L (ref 98–110)
Creat: 0.9 mg/dL (ref 0.70–1.25)
GFR, Est African American: 107 mL/min/{1.73_m2} (ref >=60)
GFR, Est Non African American: 93 mL/min/{1.73_m2} (ref >=60)
Globulin: 2.5 g/dL (calc) (ref 1.9–3.7)
Glucose, Bld: 147 mg/dL — ABNORMAL HIGH (ref 65–99)
Potassium: 4.2 mmol/L (ref 3.5–5.3)
Sodium: 141 mmol/L (ref 135–146)
Total Bilirubin: 0.5 mg/dL (ref 0.2–1.2)
Total Protein: 6.6 g/dL (ref 6.1–8.1)

## 2020-11-09 LAB — CBC WITH DIFFERENTIAL/PLATELET
Absolute Monocytes: 554 cells/uL (ref 200–950)
Basophils Absolute: 38 cells/uL (ref 0–200)
Basophils Relative: 0.6 %
Eosinophils Absolute: 139 cells/uL (ref 15–500)
Eosinophils Relative: 2.2 %
HCT: 43.6 % (ref 38.5–50.0)
Hemoglobin: 14.3 g/dL (ref 13.2–17.1)
Lymphs Abs: 1865 cells/uL (ref 850–3900)
MCH: 30.7 pg (ref 27.0–33.0)
MCHC: 32.8 g/dL (ref 32.0–36.0)
MCV: 93.6 fL (ref 80.0–100.0)
MPV: 9.7 fL (ref 7.5–12.5)
Monocytes Relative: 8.8 %
Neutro Abs: 3704 cells/uL (ref 1500–7800)
Neutrophils Relative %: 58.8 %
Platelets: 311 10*3/uL (ref 140–400)
RBC: 4.66 10*6/uL (ref 4.20–5.80)
RDW: 12.4 % (ref 11.0–15.0)
Total Lymphocyte: 29.6 %
WBC: 6.3 10*3/uL (ref 3.8–10.8)

## 2020-11-09 LAB — LIPID PANEL
Cholesterol: 129 mg/dL (ref <200)
HDL: 50 mg/dL (ref >=40)
LDL Cholesterol (Calc): 63 mg/dL (calc)
Non-HDL Cholesterol (Calc): 79 mg/dL (calc) (ref <130)
Total CHOL/HDL Ratio: 2.6 (calc) (ref <5.0)
Triglycerides: 82 mg/dL (ref <150)

## 2020-11-09 LAB — HEMOGLOBIN A1C
Hgb A1c MFr Bld: 6.4 % of total Hgb — ABNORMAL HIGH (ref <5.7)
Mean Plasma Glucose: 137 mg/dL
eAG (mmol/L): 7.6 mmol/L

## 2020-11-09 LAB — VITAMIN D 25 HYDROXY (VIT D DEFICIENCY, FRACTURES): Vit D, 25-Hydroxy: 34 ng/mL (ref 30–100)

## 2020-11-09 LAB — VITAMIN B12: Vitamin B-12: 1084 pg/mL (ref 200–1100)

## 2020-11-09 LAB — PSA: PSA: 0.66 ng/mL (ref <=4.0)

## 2020-11-11 ENCOUNTER — Encounter: Payer: BC Managed Care – PPO | Admitting: Family Medicine

## 2020-11-15 ENCOUNTER — Encounter: Payer: Self-pay | Admitting: Family Medicine

## 2020-11-15 ENCOUNTER — Other Ambulatory Visit: Payer: Self-pay

## 2020-11-15 ENCOUNTER — Ambulatory Visit (INDEPENDENT_AMBULATORY_CARE_PROVIDER_SITE_OTHER): Payer: BC Managed Care – PPO | Admitting: Family Medicine

## 2020-11-15 VITALS — BP 128/78 | HR 67 | Ht 74.0 in | Wt 275.8 lb

## 2020-11-15 DIAGNOSIS — I6930 Unspecified sequelae of cerebral infarction: Secondary | ICD-10-CM | POA: Diagnosis not present

## 2020-11-15 DIAGNOSIS — Z0000 Encounter for general adult medical examination without abnormal findings: Secondary | ICD-10-CM

## 2020-11-15 DIAGNOSIS — E785 Hyperlipidemia, unspecified: Secondary | ICD-10-CM | POA: Diagnosis not present

## 2020-11-15 DIAGNOSIS — R7309 Other abnormal glucose: Secondary | ICD-10-CM | POA: Diagnosis not present

## 2020-11-15 DIAGNOSIS — Z8673 Personal history of transient ischemic attack (TIA), and cerebral infarction without residual deficits: Secondary | ICD-10-CM

## 2020-11-15 DIAGNOSIS — E669 Obesity, unspecified: Secondary | ICD-10-CM | POA: Insufficient documentation

## 2020-11-15 MED ORDER — ATORVASTATIN CALCIUM 40 MG PO TABS: 40.0000 mg | ORAL_TABLET | Freq: Every day | ORAL | 3 refills | Status: DC

## 2020-11-15 NOTE — Assessment & Plan Note (Signed)
Morbid obesity BMI >35 with co morbid dyslipidemia, elevated A1c, history of Stroke CVA

## 2020-11-15 NOTE — Progress Notes (Signed)
Subjective:    Patient ID: Randy Watson, male    DOB: [birth year], 60 y.o.   MRN: [id number]  Randy Watson is a 60 y.o. male presenting on 11/15/2020 for Annual Exam   HPI   Here for Annual Physical and Lab Review.  Lumbar DDD / Chronic Back Pain Hip Pain Followed by [facility] 3 weeks ago - Hip injection, Low back / L sided Then future if need can do MRI on spine and do further evaluation on disc. Initially improved, for first 2 weeks it was great then some gradual inc pain. Still improved  HYPERLIPIDEMIA: - Reports no concerns. Last lipid panel 11/2020 - Currently taking Atorvastatin 40mg , tolerating well without side effects or myalgias  Vitamin D deficiency Last lab Vitamin D normal range 11/2020 On vitamin D supplement, 50k weekly completed in past. Now on Vitamin D3 1k daily  Vitamin B12 deficiency On Vitamin B12 1k daily  Elevated A1c / Pre-Diabetes Prior results 5.7 1-3 years ago, now last 2 results are 6.3 and now up to 6.4 (11/2020) He admits that lifestyle factor with higher carb intake thinks this is contributing. Not on medicine  History of CVA, L cerebellar w/ PICA He continues on ASA 81, Lipitor 40mg  Now followed by Cardiology at [facility] - saw NP Wendee Beavers on 10/24/20 has upcoming stress test scheduled, on 11/25/20.  Additional history  Constipation Hemorrhoid mild bleeding x 1 only.  Health Maintenance:  Cologuard negative 10/24/19 - negative. Repeat due 10/2022.  Prostate CA Screening: PSA 0.66 (11/2020). Asymptomatic. Asymptomatic. No BPH. Fam history MGF with prostate cancer.   Depression screen [facility] 2/9 12/14/2019 10/11/2019 03/03/2019  Decreased Interest 0 0 0  Down, Depressed, Hopeless 0 0 0  PHQ - 2 Score 0 0 0  Altered sleeping - - 1  Tired, decreased energy - - 1  Change in appetite - - 0  Feeling bad or failure about yourself  - - 0  Trouble concentrating - - 1  Moving slowly or fidgety/restless - - 0   Suicidal thoughts - - 0  PHQ-9 Score - - 3    Past Medical History:  Diagnosis Date  . Allergy   . Anal fissure   . Constipation   . GERD (gastroesophageal reflux disease)   . History of chicken pox   . Obesity   . Stroke [facility]) 12/2018   Past Surgical History:  Procedure Laterality Date  . TEE WITHOUT CARDIOVERSION N/A 05/31/2019   Procedure: TRANSESOPHAGEAL ECHOCARDIOGRAM (TEE);  Surgeon: Corey Skains, MD;  Location: [facility] ORS;  Service: Cardiovascular;  Laterality: N/A;   Social History   Socioeconomic History  . Marital status: Married    Spouse name: Not on file  . Number of children: Not on file  . Years of education: Not on file  . Highest education level: Not on file  Occupational History  . Not on file  Tobacco Use  . Smoking status: Never Smoker  . Smokeless tobacco: Former Systems developer    Types: Chew  . Tobacco comment: dip  Vaping Use  . Vaping Use: Never used  Substance and Sexual Activity  . Alcohol use: Yes    Comment: occasionally  . Drug use: No  . Sexual activity: Not on file  Other Topics Concern  . Not on file  Social History Narrative  . Not on file   Social Determinants of Health   Financial Resource Strain: Not on file  Food Insecurity: Not on file  Transportation Needs: Not on file  Physical Activity: Not on file  Stress: Not on file  Social Connections: Not on file  Intimate Partner Violence: Not on file   Family History  Problem Relation Age of Onset  . Cancer Father        renal/pancreatic  . CAD Father   . Diabetes Father   . Prostate cancer Maternal Grandfather   . Bladder Cancer Neg Hx    Current Outpatient Medications on File Prior to Visit  Medication Sig  . acetaminophen (TYLENOL) 500 MG tablet Take 1,000 mg by mouth every 6 (six) hours as needed for moderate pain or headache.  Marland Kitchen aspirin 81 MG EC tablet Take by mouth.  . cholecalciferol (VITAMIN D3) 25 MCG (1000 UT) tablet Take 1,000 Units by mouth at bedtime.  .  fluticasone (FLONASE) 50 MCG/ACT nasal spray Place 2 sprays into both nostrils daily. Use for 4-6 weeks then stop and use seasonally or as needed.  Marland Kitchen omeprazole (PRILOSEC OTC) 20 MG tablet Take 20-40 mg by mouth daily as needed (acid reflux).  . vitamin B-12 (CYANOCOBALAMIN) 1000 MCG tablet Take 1,000 mcg by mouth at bedtime.  . cyclobenzaprine (FLEXERIL) 10 MG tablet Take 0.5-1 tablets (5-10 mg total) by mouth 3 (three) times daily as needed for muscle spasms. (Patient not taking: No sig reported)  . [DISCONTINUED] triamcinolone (NASACORT ALLERGY 24HR) 55 MCG/ACT AERO nasal inhaler Place 1 spray into the nose daily as needed (allergies).   No current facility-administered medications on file prior to visit.    Review of Systems  Constitutional: Negative for activity change, appetite change, chills, diaphoresis, fatigue and fever.  HENT: Negative for congestion and hearing loss.   Eyes: Negative for visual disturbance.  Respiratory: Negative for cough, chest tightness, shortness of breath and wheezing.   Cardiovascular: Negative for chest pain, palpitations and leg swelling.  Gastrointestinal: Negative for abdominal pain, constipation, diarrhea, nausea and vomiting.  Endocrine: Negative for cold intolerance.  Genitourinary: Negative for dysuria, frequency and hematuria.  Musculoskeletal: Negative for arthralgias and neck pain.  Skin: Negative for rash.  Allergic/Immunologic: Negative for environmental allergies.  Neurological: Negative for dizziness, weakness, light-headedness, numbness and headaches.  Hematological: Negative for adenopathy.  Psychiatric/Behavioral: Negative for behavioral problems, dysphoric mood and sleep disturbance.   Per HPI unless specifically indicated above      Objective:    BP 128/78 (BP Location: Left Arm, Cuff Size: Normal)   Pulse 67   Ht 6\' 2"  (1.88 m)   Wt 275 lb 12.8 oz (125.1 kg)   SpO2 99%   BMI 35.41 kg/m   Wt Readings from Last 3 Encounters:   11/15/20 275 lb 12.8 oz (125.1 kg)  12/14/19 282 lb (127.9 kg)  10/11/19 287 lb (130.2 kg)    Physical Exam Vitals and nursing note reviewed.  Constitutional:      General: He is not in acute distress.    Appearance: He is well-developed. He is not diaphoretic.     Comments: Well-appearing, comfortable, cooperative  HENT:     Head: Normocephalic and atraumatic.  Eyes:     General:        Right eye: No discharge.        Left eye: No discharge.     Conjunctiva/sclera: Conjunctivae normal.     Pupils: Pupils are equal, round, and reactive to light.  Neck:     Thyroid: No thyromegaly.  Cardiovascular:     Rate and Rhythm: Normal rate and regular rhythm.  Heart sounds: Murmur (1/6 systolic) heard.    Pulmonary:     Effort: Pulmonary effort is normal. No respiratory distress.     Breath sounds: Normal breath sounds. No wheezing or rales.  Abdominal:     General: Bowel sounds are normal. There is no distension.     Palpations: Abdomen is soft. There is no mass.     Tenderness: There is no abdominal tenderness.  Musculoskeletal:        General: No tenderness. Normal range of motion.     Cervical back: Normal range of motion and neck supple.     Comments: Upper / Lower Extremities: - Normal muscle tone, strength bilateral upper extremities 5/5, lower extremities 5/5  Lymphadenopathy:     Cervical: No cervical adenopathy.  Skin:    General: Skin is warm and dry.     Findings: No erythema or rash.  Neurological:     Mental Status: He is alert and oriented to person, place, and time.     Comments: Distal sensation intact to light touch all extremities  Psychiatric:        Behavior: Behavior normal.     Comments: Well groomed, good eye contact, normal speech and thoughts        Results for orders placed or performed in visit on 11/07/20  Vitamin B12  Result Value Ref Range   Vitamin B-12 1,084 200 - 1,100 pg/mL  VITAMIN D 25 Hydroxy (Vit-D Deficiency, Fractures)   Result Value Ref Range   Vit D, 25-Hydroxy 34 30 - 100 ng/mL  Hemoglobin A1c  Result Value Ref Range   Hgb A1c MFr Bld 6.4 (H) <5.7 % of total Hgb   Mean Plasma Glucose 137 mg/dL   eAG (mmol/L) 7.6 mmol/L  CBC with Differential/Platelet  Result Value Ref Range   WBC 6.3 3.8 - 10.8 Thousand/uL   RBC 4.66 4.20 - 5.80 Million/uL   Hemoglobin 14.3 13.2 - 17.1 g/dL   HCT 43.6 38.5 - 50.0 %   MCV 93.6 80.0 - 100.0 fL   MCH 30.7 27.0 - 33.0 pg   MCHC 32.8 32.0 - 36.0 g/dL   RDW 12.4 11.0 - 15.0 %   Platelets 311 140 - 400 Thousand/uL   MPV 9.7 7.5 - 12.5 fL   Neutro Abs 3,704 1,500 - 7,800 cells/uL   Lymphs Abs 1,865 850 - 3,900 cells/uL   Absolute Monocytes 554 200 - 950 cells/uL   Eosinophils Absolute 139 15 - 500 cells/uL   Basophils Absolute 38 0 - 200 cells/uL   Neutrophils Relative % 58.8 %   Total Lymphocyte 29.6 %   Monocytes Relative 8.8 %   Eosinophils Relative 2.2 %   Basophils Relative 0.6 %  COMPLETE METABOLIC PANEL WITH GFR  Result Value Ref Range   Glucose, Bld 147 (H) 65 - 99 mg/dL   BUN 13 7 - 25 mg/dL   Creat 0.90 0.70 - 1.25 mg/dL   GFR, Est Non African American 93 > OR = 60 mL/min/1.28m2   GFR, Est African American 107 > OR = 60 mL/min/1.13m2   BUN/Creatinine Ratio NOT APPLICABLE 6 - 22 (calc)   Sodium 141 135 - 146 mmol/L   Potassium 4.2 3.5 - 5.3 mmol/L   Chloride 106 98 - 110 mmol/L   CO2 25 20 - 32 mmol/L   Calcium 9.4 8.6 - 10.3 mg/dL   Total Protein 6.6 6.1 - 8.1 g/dL   Albumin 4.1 3.6 - 5.1 g/dL   Globulin 2.5 1.9 -  3.7 g/dL (calc)   AG Ratio 1.6 1.0 - 2.5 (calc)   Total Bilirubin 0.5 0.2 - 1.2 mg/dL   Alkaline phosphatase (APISO) 95 35 - 144 U/L   AST 23 10 - 35 U/L   ALT 34 9 - 46 U/L  Lipid panel  Result Value Ref Range   Cholesterol 129 <200 mg/dL   HDL 50 > OR = 40 mg/dL   Triglycerides 82 <150 mg/dL   LDL Cholesterol (Calc) 63 mg/dL (calc)   Total CHOL/HDL Ratio 2.6 <5.0 (calc)   Non-HDL Cholesterol (Calc) 79 <130 mg/dL (calc)  PSA   Result Value Ref Range   PSA 0.66 < OR = 4.0 ng/mL      Assessment & Plan:   Problem List Items Addressed This Visit    Morbid obesity ([place])    Morbid obesity BMI >35 with co morbid dyslipidemia, elevated A1c, history of Stroke CVA      History of cerebrovascular accident (CVA) with residual deficit   Elevated hemoglobin A1c   Dyslipidemia   Relevant Medications   atorvastatin (LIPITOR) 40 MG tablet    Other Visit Diagnoses    Annual physical exam    -  Primary   History of CVA (cerebrovascular accident)       Relevant Medications   atorvastatin (LIPITOR) 40 MG tablet      Updated Health Maintenance information Reviewed recent lab results with patient Controlled on Statin - refill atorvastatin Counseling on risk of progression to T2DM. Stable at 6.3 to 6.4 A1c now but at risk. Diet handout given. Encouraged improvement to lifestyle with diet and exercise Goal of weight loss   Meds ordered this encounter  Medications  . atorvastatin (LIPITOR) 40 MG tablet    Sig: Take 1 tablet (40 mg total) by mouth at bedtime.    Dispense:  90 tablet    Refill:  3    Follow up plan: Return in about 6 months (around 05/17/2021) for 6 month follow-up PreDM A1c, BP check.  [name], [facility] 11/15/2020, 8:40 AM

## 2020-11-15 NOTE — Patient Instructions (Addendum)
Thank you for coming to the office today.  Refilled Atorvastatin.  Mild heart murmur, stay tuned for cardiology results.  Keep on vitamins.  Recent Labs    12/14/19 0837 11/08/20 0818  HGBA1C 6.3* 6.4*   At risk of future diabetes >6.5 - try to improve diet and lifestyle, keep improving wt loss  Diet Recommendations for Preventing Diabetes   Reducing Starchy (carb) foods include: Bread, rice, pasta, potatoes, corn, crackers, bagels, muffins, all baked goods.   Protein foods include: Meat, fish, poultry, eggs, dairy foods, and beans such as pinto and kidney beans (beans also provide carbohydrate).   1. Eat at least 3 meals and 1-2 snacks per day. Never go more than 4-5 hours while awake without eating.   2. Limit starchy foods to TWO per meal and ONE per snack. ONE portion of a starchy  food is equal to the following:   - ONE slice of bread (or its equivalent, such as half of a hamburger bun).   - 1/2 cup of a "scoopable" starchy food such as potatoes or rice.   - 1 OUNCE (28 grams) of starchy snacks (crackers or pretzels, look on label).   - 15 grams of carbohydrate as shown on food label.   3. Both lunch and dinner should include a protein food, a carb food, and vegetables.   - Obtain twice as many veg's as protein or carbohydrate foods for both lunch and dinner.   - Try to keep frozen veg's on hand for a quick vegetable serving.     - Fresh or frozen veg's are best.   4. Breakfast should always include protein.      DUE for FASTING BLOOD WORK (no food or drink after midnight before the lab appointment, only water or coffee without cream/sugar on the morning of)  SCHEDULE "Lab Only" visit in the morning at the clinic for lab draw in 6 MONTHS   - Make sure Lab Only appointment is at about 1 week before your next appointment, so that results will be available  For Lab Results, once available within 2-3 days of blood draw, you can can log in to MyChart online to view your  results and a brief explanation. Also, we can discuss results at next follow-up visit.   Please schedule a Follow-up Appointment to: Return in about 6 months (around 05/17/2021) for 6 month follow-up PreDM A1c, BP check.  If you have any other questions or concerns, please feel free to call the office or send a message through [place]. You may also schedule an earlier appointment if necessary.  Additionally, you may be receiving a survey about your experience at our office within a few days to 1 week by e-mail or mail. We value your feedback.  [name], DO [place]

## 2021-04-03 ENCOUNTER — Emergency Department
Admission: EM | Admit: 2021-04-03 | Discharge: 2021-04-04 | Disposition: A | Discharge status: Home / Self Care | Payer: BC Managed Care – PPO | Attending: Emergency Medicine | Admitting: Emergency Medicine

## 2021-04-03 ENCOUNTER — Other Ambulatory Visit: Payer: Self-pay

## 2021-04-03 ENCOUNTER — Encounter: Payer: Self-pay | Admitting: *Deleted

## 2021-04-03 ENCOUNTER — Emergency Department: Payer: BC Managed Care – PPO

## 2021-04-03 DIAGNOSIS — Z87891 Personal history of nicotine dependence: Secondary | ICD-10-CM | POA: Diagnosis not present

## 2021-04-03 DIAGNOSIS — N23 Unspecified renal colic: Secondary | ICD-10-CM | POA: Diagnosis not present

## 2021-04-03 DIAGNOSIS — Z7982 Long term (current) use of aspirin: Secondary | ICD-10-CM | POA: Insufficient documentation

## 2021-04-03 DIAGNOSIS — N200 Calculus of kidney: Secondary | ICD-10-CM | POA: Diagnosis not present

## 2021-04-03 DIAGNOSIS — R109 Unspecified abdominal pain: Secondary | ICD-10-CM | POA: Diagnosis present

## 2021-04-03 LAB — URINALYSIS, COMPLETE (UACMP) WITH MICROSCOPIC
Bacteria, UA: NONE SEEN
Bilirubin Urine: NEGATIVE
Glucose, UA: NEGATIVE mg/dL
Ketones, ur: NEGATIVE mg/dL
Leukocytes,Ua: NEGATIVE
Nitrite: NEGATIVE
Protein, ur: 100 mg/dL — AB
RBC / HPF: >50 RBC/hpf — ABNORMAL HIGH (ref 0–5)
Specific Gravity, Urine: 1.026 (ref 1.005–1.030)
Squamous Epithelial / HPF: NONE SEEN (ref 0–5)
WBC, UA: >50 WBC/hpf — ABNORMAL HIGH (ref 0–5)
pH: 5 (ref 5.0–8.0)

## 2021-04-03 LAB — BASIC METABOLIC PANEL
Anion gap: 9 (ref 5–15)
BUN: 17 mg/dL (ref 8–23)
CO2: 28 mmol/L (ref 22–32)
Calcium: 9.6 mg/dL (ref 8.9–10.3)
Chloride: 103 mmol/L (ref 98–111)
Creatinine, Ser: 1.21 mg/dL (ref 0.61–1.24)
GFR, Estimated: >60 mL/min (ref >60)
Glucose, Bld: 119 mg/dL — ABNORMAL HIGH (ref 70–99)
Potassium: 4.2 mmol/L (ref 3.5–5.1)
Sodium: 140 mmol/L (ref 135–145)

## 2021-04-03 LAB — CBC
HCT: 43 % (ref 39.0–52.0)
Hemoglobin: 15 g/dL (ref 13.0–17.0)
MCH: 33.5 pg (ref 26.0–34.0)
MCHC: 34.9 g/dL (ref 30.0–36.0)
MCV: 96 fL (ref 80.0–100.0)
Platelets: 308 10*3/uL (ref 150–400)
RBC: 4.48 MIL/uL (ref 4.22–5.81)
RDW: 12.4 % (ref 11.5–15.5)
WBC: 11.6 10*3/uL — ABNORMAL HIGH (ref 4.0–10.5)
nRBC: 0 % (ref 0.0–0.2)

## 2021-04-03 NOTE — ED Triage Notes (Addendum)
Pain in the lower back, radiates around to the right lower abdomen and into the right testicle. Vomited x 2. Urinated once, denies blood. Also having constipation x1, took mirilax this morning for the same.

## 2021-04-04 MED ORDER — OXYCODONE-ACETAMINOPHEN 5-325 MG PO TABS
1.0000 | ORAL_TABLET | Freq: Once | ORAL | Status: AC
  Administered 2021-04-04: 1 via ORAL
  Filled 2021-04-04: qty 1

## 2021-04-04 MED ORDER — TAMSULOSIN HCL 0.4 MG PO CAPS
0.4000 mg | ORAL_CAPSULE | Freq: Once | ORAL | Status: AC
  Administered 2021-04-04: 0.4 mg via ORAL
  Filled 2021-04-04: qty 1

## 2021-04-04 MED ORDER — ONDANSETRON 4 MG PO TBDP
4.0000 mg | ORAL_TABLET | Freq: Once | ORAL | Status: AC
  Administered 2021-04-04: 4 mg via ORAL
  Filled 2021-04-04: qty 1

## 2021-04-04 MED ORDER — ACETAMINOPHEN 325 MG PO TABS
650.0000 mg | ORAL_TABLET | Freq: Once | ORAL | Status: AC
  Administered 2021-04-04: 650 mg via ORAL
  Filled 2021-04-04: qty 2

## 2021-04-04 MED ORDER — TAMSULOSIN HCL 0.4 MG PO CAPS: 0.4000 mg | ORAL_CAPSULE | Freq: Every day | ORAL | 0 refills | Status: AC

## 2021-04-04 MED ORDER — ONDANSETRON 4 MG PO TBDP: 4.0000 mg | ORAL_TABLET | Freq: Three times a day (TID) | ORAL | 0 refills | Status: DC | PRN

## 2021-04-04 MED ORDER — OXYCODONE-ACETAMINOPHEN 5-325 MG PO TABS: 1.0000 | ORAL_TABLET | ORAL | 0 refills | Status: DC | PRN

## 2021-04-04 NOTE — ED Provider Notes (Signed)
[facility] Emergency Department Provider Note  ____________________________________________  Time seen: Approximately 1:08 AM  I have reviewed the triage vital signs and the nursing notes.   HISTORY  Chief Complaint Flank Pain   HPI Randy Watson is a 60 y.o. male with a history of stroke on aspirin, GERD, obesity who presents for evaluation of flank pain.  Patient reports sudden onset of severe sharp right flank pain radiating down to the scrotum associated with nausea and 2 episodes of nonbloody nonbilious emesis.  Patient was clammy and diaphoretic with severe pain at home.  At this time the pain has subsided.  Patient denies any prior history of kidney stones or any prior history of abdominal surgeries.  Has noticed some hematuria but denies dysuria, fever, chest pain, shortness of breath.   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 Authorizing Provider  ondansetron (ZOFRAN ODT) 4 MG disintegrating  tablet Take 1 tablet (4 mg total) by mouth every 8 (eight) hours as needed. 04/04/21  Yes Alfred Levins, [place], MD  oxyCODONE-acetaminophen (PERCOCET) 5-325 MG tablet Take 1 tablet by mouth every 4 (four) hours as needed for severe pain. 04/04/21 04/04/22 Yes Keita [place], [place], MD  tamsulosin (FLOMAX) 0.4 MG CAPS capsule Take 1 capsule (0.4 mg total) by mouth daily for 7 days. 04/04/21 04/11/21 Yes Rydan Gulyas, [place], MD  acetaminophen (TYLENOL) 500 MG tablet Take 1,000 mg by mouth every 6 (six) hours as needed for moderate pain or headache.    [provider]  aspirin 81 MG EC tablet Take by mouth. 01/03/19   [provider]  atorvastatin (LIPITOR) 40 MG tablet Take 1 tablet (40 mg total) by mouth at bedtime. 11/15/20   Karamalegos, Devonne Doughty, DO  cholecalciferol (VITAMIN D3) 25 MCG (1000 UT) tablet Take 1,000 Units by mouth at bedtime.    [provider]  cyclobenzaprine (FLEXERIL) 10 MG tablet Take 0.5-1 tablets (5-10 mg total) by mouth 3 (three) times daily as needed for muscle spasms. Patient not taking: No sig reported 12/26/18   Olin Hauser, DO  fluticasone [facility]) 50 MCG/ACT nasal spray Place 2 sprays into both nostrils daily. Use for 4-6 weeks then stop and use seasonally or as needed. 10/11/19   Karamalegos, Devonne Doughty, DO  omeprazole (PRILOSEC OTC) 20 MG tablet Take 20-40 mg by mouth daily as needed (acid reflux).    [provider]  vitamin B-12 (CYANOCOBALAMIN) 1000 MCG tablet Take 1,000 mcg by mouth at bedtime.    [provider]  triamcinolone (NASACORT ALLERGY 24HR) 55 MCG/ACT AERO nasal inhaler Place 1 spray into the nose daily as needed (allergies).  08/22/19  [provider]    Allergies Penicillins  Family History  Problem Relation Age of Onset   Cancer Father        renal/pancreatic   CAD Father    Diabetes Father    Prostate cancer Maternal Grandfather    Bladder Cancer Neg Hx     Social History Social  History   Tobacco Use   Smoking status: Never   Smokeless tobacco: Former    Types: Chew    Quit date: 01/01/2019   Tobacco comments:    dip  Vaping Use   Vaping Use: Never used  Substance Use Topics   Alcohol use: Yes    Comment: occasionally   Drug use: No    Review of Systems  Constitutional: Negative for fever. Eyes: Negative for visual changes. ENT: Negative for sore throat. Neck: No neck pain  Cardiovascular: Negative for chest pain. Respiratory: Negative for shortness of breath. Gastrointestinal: Negative for abdominal pain or diarrhea. + N/V Genitourinary: Negative for dysuria. + R flank pain, hematuria Musculoskeletal: Negative for back pain. Skin: Negative for rash. Neurological: Negative for headaches, weakness or numbness. Psych: No SI or HI  ____________________________________________   PHYSICAL EXAM:  VITAL SIGNS: ED Triage Vitals [04/03/21 2117]  Enc Vitals Group     BP (!) 164/99     Pulse Rate 85     Resp 16     Temp 98 F (36.7 C)     Temp Source Oral     SpO2 97 %     Weight      Height      Head Circumference      Peak Flow      Pain Score 8     Pain Loc      Pain Edu?      Excl. in [place]?     Constitutional: Alert and oriented. Well appearing and in no apparent distress. HEENT:      Head: Normocephalic and atraumatic.         Eyes: Conjunctivae are normal. Sclera is non-icteric.       Mouth/Throat: Mucous membranes are moist.       Neck: Supple with no signs of meningismus. Cardiovascular: Regular rate and rhythm. No murmurs, gallops, or rubs. 2+ symmetrical distal pulses are present in all extremities. No JVD. Respiratory: Normal respiratory effort. Lungs are clear to auscultation bilaterally.  Gastrointestinal: Soft, non tender, and non distended with positive bowel sounds. No rebound or guarding. Genitourinary: No CVA tenderness. Musculoskeletal:  No edema, cyanosis, or erythema of extremities. Neurologic: Normal speech and  language. Face is symmetric. Moving all extremities. No gross focal neurologic deficits are appreciated. Skin: Skin is warm, dry and intact. No rash noted. Psychiatric: Mood and affect are normal. Speech and behavior are normal.  ____________________________________________   LABS (all labs ordered are listed, but only abnormal results are displayed)  Labs Reviewed  URINALYSIS, COMPLETE (UACMP) WITH MICROSCOPIC - Abnormal; Notable for the following components:      Result Value   Color, Urine AMBER (*)    APPearance CLOUDY (*)    Hgb urine dipstick LARGE (*)    Protein, ur 100 (*)    RBC / HPF >50 (*)    WBC, UA >50 (*)    All other components within normal limits  BASIC METABOLIC PANEL - Abnormal; Notable for the following components:  Glucose, Bld 119 (*)    All other components within normal limits  CBC - Abnormal; Notable for the following components:   WBC 11.6 (*)    All other components within normal limits   ____________________________________________  EKG  none  ____________________________________________  RADIOLOGY  I have personally reviewed the images performed during this visit and I agree with the Radiologist's read.   Interpretation by Radiologist:  CT Renal Stone Study  Result Date: 04/03/2021 CLINICAL DATA:  Hematuria, unknown cause. Low back pain radiating to the right groin. Vomiting. EXAM: CT ABDOMEN AND PELVIS WITHOUT CONTRAST TECHNIQUE: Multidetector CT imaging of the abdomen and pelvis was performed following the standard protocol without IV contrast. COMPARISON:  None. FINDINGS: Lower chest: 5 mm subpleural pulmonary nodule is seen within the a visualized right middle lobe. The visualized lung bases are otherwise clear. Visualized heart and pericardium are unremarkable. Hepatobiliary: No focal liver abnormality is seen. No gallstones, gallbladder wall thickening, or biliary dilatation. Pancreas: Unremarkable Spleen: Unremarkable Adrenals/Urinary  Tract: The adrenal glands are unremarkable. The kidneys are normal in size and position. Mild right hydronephrosis secondary to an obstructing 2 mm calculus within the mid right ureter at the level of the right transverse process of L4. No additional nephro or urolithiasis. No hydronephrosis on the left. The bladder is unremarkable. Stomach/Bowel: Severe distal descending and sigmoid colonic diverticulosis. The stomach, small bowel, and large bowel are otherwise unremarkable. Appendix normal. No evidence of obstruction or focal inflammation. No free intraperitoneal gas or fluid. Vascular/Lymphatic: Mild aortoiliac atherosclerotic calcification. No aortic aneurysm. No pathologic adenopathy within the abdomen and pelvis. Reproductive: Prostate is unremarkable. Other: No abdominal wall hernia. Musculoskeletal: Degenerative changes are seen within the lumbar spine. No lytic or blastic bone lesion. No acute bone abnormality. IMPRESSION: Obstructing 2 mm calculus within the mid right ureter resulting in mild right hydronephrosis. Distal colonic diverticulosis.  No superimposed inflammatory change. 5 mm right middle lobe subpleural pulmonary nodule. No follow-up needed if patient is low-risk. Non-contrast chest CT can be considered in 12 months if patient is high-risk. This recommendation follows the consensus statement: Guidelines for Management of Incidental Pulmonary Nodules Detected on CT Images: From the Fleischner Society 2017; Radiology 2017; 284:228-243. Aortic Atherosclerosis (ICD10-I70.0). Electronically Signed   By: Fidela Salisbury M.D.   On: 04/03/2021 22:25     ____________________________________________   PROCEDURES  Procedure(s) performed: None Procedures Critical Care performed:  None ____________________________________________   INITIAL IMPRESSION / ASSESSMENT AND PLAN / ED COURSE   60 y.o. male with a history of stroke on aspirin, GERD, obesity who presents for evaluation of flank pain.   Patient with sudden onset of right flank pain and hematuria.  CT showing an obstructing 2 mm calculus within the right mid ureter causing mild right hydroureteronephrosis.  During my evaluation patient was pain-free most likely due to passage of the stone into the bladder.  UA with no signs of overlying UTI.  Patient was given a dose of Percocet, Zofran and Flomax in the emergency room to prevent symptoms from recurring.  We discussed dietary changes and hydration at home.  Discussed pain care at home and my standard return precautions for new or worsening pain, fever, or dysuria.  History was gathered from patient and his wife was at bedside.  Plan discussed with both of them.  Patient's medical records reviewed.      _____________________________________________ Please note:  Patient was evaluated in Emergency Department today for the symptoms described in the history of present illness.  Patient was evaluated in the context of the global COVID-19 pandemic, which necessitated consideration that the patient might be at risk for infection with the SARS-CoV-2 virus that causes COVID-19. Institutional protocols and algorithms that pertain to the evaluation of patients at risk for COVID-19 are in a state of rapid change based on information released by regulatory bodies including the CDC and federal and state organizations. These policies and algorithms were followed during the patient's care in the ED.  Some ED evaluations and interventions may be delayed as a result of limited staffing during the pandemic.   [place] Controlled Substance Database was reviewed by me. ____________________________________________   FINAL CLINICAL IMPRESSION(S) / ED DIAGNOSES   Final diagnoses:  Kidney stone  Renal colic      NEW MEDICATIONS STARTED DURING THIS VISIT:  ED Discharge Orders          Ordered    oxyCODONE-acetaminophen (PERCOCET) 5-325 MG tablet  Every 4 hours PRN        04/04/21 0107    tamsulosin  (FLOMAX) 0.4 MG CAPS capsule  Daily        04/04/21 0107    ondansetron (ZOFRAN ODT) 4 MG disintegrating tablet  Every 8 hours PRN        04/04/21 0107             Note:  This document was prepared using Dragon voice recognition software and may include unintentional dictation errors.    Alfred Levins, [place], MD 04/04/21 0111

## 2021-04-04 NOTE — Discharge Instructions (Addendum)
You have been seen in the Emergency Department (ED)  Today and was diagnosed with kidney stones. While the stone is traveling through the ureter, which is the tube that carries urine from the kidney to the bladder, you will probably feel pain. The pain may be mild or very severe. You may also have some blood in your urine. As soon as the stone reaches the bladder, any intense pain should go away. If a stone is too large to pass on its own, you may need a medical procedure to help you pass the stone.   As we have discussed, please drink plenty of fluids and use a urinary strainer to attempt to capture the stone.  Please make a follow up appointment with Urology in the next week by calling the number below and bring the stone with you.  Pain control: Take ibuprofen '600mg'$  every 6 hours for the pain. If the pain is not well controlled with ibuprofen you may take one percocet every 4 hours. Do not take tylenol while taking percocet. Please also take your prescribed flomax daily.   Follow-up with your doctor or return to the ER in 12-24 hours if your pain is not well controlled, if you develop pain or burning with urination, or if you develop a fever. Otherwise follow up in 3-5 days with your doctor.  When should you call for help?  Call your doctor now or seek immediate medical care if:  You cannot keep down fluids.  Your pain gets worse.  You have a fever or chills.  You have new or worse pain in your back just below your rib cage (the flank area).  You have new or more blood in your urine. You have pain or burning with urination You are unable to urinate You have abdominal pain  Watch closely for changes in your health, and be sure to contact your doctor if:  You do not get better as expected  How can you care for yourself at home?  Drink plenty of fluids, enough so that your urine is light yellow or clear like water. If you have kidney, heart, or liver disease and have to limit fluids, talk with  your doctor before you increase the amount of fluids you drink.  Take pain medicines exactly as directed. Call your doctor if you think you are having a problem with your medicine.  If the doctor gave you a prescription medicine for pain, take it as prescribed.  If you are not taking a prescription pain medicine, ask your doctor if you can take an over-the-counter medicine. Read and follow all instructions on the label. Your doctor may ask you to strain your urine so that you can collect your kidney stone when it passes. You can use a kitchen strainer or a tea strainer to catch the stone. Store it in a plastic bag until you see your doctor again.  Preventing future kidney stones  Some changes in your diet may help prevent kidney stones. Depending on the cause of your stones, your doctor may recommend that you:  Drink plenty of fluids, enough so that your urine is light yellow or clear like water. If you have kidney, heart, or liver disease and have to limit fluids, talk with your doctor before you increase the amount of fluids you drink.  Limit coffee, tea, and alcohol. Also avoid grapefruit juice.  Do not take more than the recommended daily dose of vitamins C and D.  Avoid antacids such as Gaviscon,  Maalox, Mylanta, or Tums.  Limit the amount of salt (sodium) in your diet.  Eat a balanced diet that is not too high in protein.  Limit foods that are high in a substance called oxalate, which can cause kidney stones. These foods include dark green vegetables, rhubarb, chocolate, wheat bran, nuts, cranberries, and beans.

## 2021-05-06 ENCOUNTER — Ambulatory Visit (INDEPENDENT_AMBULATORY_CARE_PROVIDER_SITE_OTHER): Payer: BC Managed Care – PPO | Admitting: Family Medicine

## 2021-05-06 ENCOUNTER — Other Ambulatory Visit: Payer: Self-pay

## 2021-05-06 VITALS — BP 148/84 | HR 64 | Ht 74.0 in | Wt 267.0 lb

## 2021-05-06 DIAGNOSIS — N200 Calculus of kidney: Secondary | ICD-10-CM | POA: Diagnosis not present

## 2021-05-06 DIAGNOSIS — I700 Atherosclerosis of aorta: Secondary | ICD-10-CM | POA: Diagnosis not present

## 2021-05-06 DIAGNOSIS — R310 Gross hematuria: Secondary | ICD-10-CM

## 2021-05-06 DIAGNOSIS — R3915 Urgency of urination: Secondary | ICD-10-CM

## 2021-05-06 DIAGNOSIS — N1330 Unspecified hydronephrosis: Secondary | ICD-10-CM

## 2021-05-06 DIAGNOSIS — R350 Frequency of micturition: Secondary | ICD-10-CM | POA: Diagnosis not present

## 2021-05-06 LAB — POCT URINALYSIS DIPSTICK
Bilirubin, UA: NEGATIVE
Glucose, UA: NEGATIVE
Ketones, UA: NEGATIVE
Leukocytes, UA: NEGATIVE
Nitrite, UA: NEGATIVE
Protein, UA: POSITIVE — AB
Spec Grav, UA: 1.02 (ref 1.010–1.025)
Urobilinogen, UA: 0.2 E.U./dL
pH, UA: 6 (ref 5.0–8.0)

## 2021-05-06 MED ORDER — TAMSULOSIN HCL 0.4 MG PO CAPS: 0.4000 mg | ORAL_CAPSULE | Freq: Every day | ORAL | 0 refills | Status: DC

## 2021-05-06 MED ORDER — CYCLOBENZAPRINE HCL 10 MG PO TABS: 5.0000 mg | ORAL_TABLET | Freq: Three times a day (TID) | ORAL | 0 refills | Status: DC | PRN

## 2021-05-06 NOTE — Patient Instructions (Addendum)
Thank you for coming to the office today.  Start the [place] -[address]st floor [place],  [place]  [id number] Phone: [phone]  Stay tuned for urgent referral to Urology for follow up and imaging.  Take pain med if needed, can use flexeril first if you prefer  Please schedule a Follow-up Appointment to: Return if symptoms worsen or fail to improve, for keep apt as scheduled.  If you have any other questions or concerns, please feel free to call the office or send a message through [place]. You may also schedule an earlier appointment if necessary.  Additionally, you may be receiving a survey about your experience at our office within a few days to 1 week by e-mail or mail. We value your feedback.  [name], DO [place]

## 2021-05-06 NOTE — Progress Notes (Signed)
Subjective:    Patient ID: Randy Watson, male    DOB: [birth year], 60 y.o.   MRN: [id number]  Randy Watson is a 60 y.o. male presenting on 05/06/2021 for Urinary Frequency ( )   HPI  ED FOLLOW-UP VISIT  Hospital/Location: [facility] Date of ED Visit: 04/03/21  Reason for Presenting to ED: Kidney Stone Primary (+Secondary) Diagnosis: Right Obstructing Nephrolithiasis  FOLLOW-UP - ED provider note and record have been reviewed - Patient presents today about 1 month after recent ED visit. Brief summary of recent course, patient had symptoms of Right back flank pain and hematuria urgency, no prior kidney stone before, presented to ED on 04/03/21, testing in ED with Renal CT study, treated with Flomax and pain relief nausea med and eventually symptoms improved..  See results below of CT showed R Hydronephrosis from 2 mm obstructing ureter stone on R side  Also incidental finding of Lung Nodule see below  - Today reports overall has done well after discharge from ED. Symptoms of urinary frequency have not resolved however, still has urgency and frequency with reduced urine output at times, and he has had 1 episode of gross hematuria  Right lower back pain onset today, some tingling and pain sensation.  - New medications on discharge: Flomax, Percocet, Zofran  I have reviewed the discharge medication list, and have reconciled the current and discharge medications today.    Depression screen [facility] 2/9 12/14/2019 10/11/2019 03/03/2019  Decreased Interest 0 0 0  Down, Depressed, Hopeless 0 0 0  PHQ - 2 Score 0 0 0  Altered sleeping - - 1  Tired, decreased energy - - 1  Change in appetite - - 0  Feeling bad or failure about yourself  - - 0  Trouble concentrating - - 1  Moving slowly or fidgety/restless - - 0  Suicidal thoughts - - 0  PHQ-9 Score - - 3    Social History   Tobacco Use   Smoking status: Never   Smokeless tobacco: Former    Types: Chew    Quit date: 01/01/2019    Tobacco comments:    dip  Vaping Use   Vaping Use: Never used  Substance Use Topics   Alcohol use: Yes    Comment: occasionally   Drug use: No    Review of Systems Per HPI unless specifically indicated above     Objective:    BP (!) 148/84   Pulse 64   Ht 6\' 2"  (1.88 m)   Wt 267 lb (121.1 kg)   SpO2 99%   BMI 34.28 kg/m   Wt Readings from Last 3 Encounters:  05/06/21 267 lb (121.1 kg)  11/15/20 275 lb 12.8 oz (125.1 kg)  12/14/19 282 lb (127.9 kg)    Physical Exam Vitals and nursing note reviewed.  Constitutional:      General: He is not in acute distress.    Appearance: He is well-developed. He is not diaphoretic.     Comments: Well-appearing, comfortable, cooperative  HENT:     Head: Normocephalic and atraumatic.  Eyes:     General:        Right eye: No discharge.        Left eye: No discharge.     Conjunctiva/sclera: Conjunctivae normal.  Neck:     Thyroid: No thyromegaly.  Cardiovascular:     Rate and Rhythm: Normal rate and regular rhythm.     Pulses: Normal pulses.     Heart sounds: Normal heart  sounds. No murmur heard. Pulmonary:     Effort: Pulmonary effort is normal. No respiratory distress.     Breath sounds: Normal breath sounds. No wheezing or rales.  Musculoskeletal:        General: Normal range of motion.     Cervical back: Normal range of motion and neck supple.  Lymphadenopathy:     Cervical: No cervical adenopathy.  Skin:    General: Skin is warm and dry.     Findings: No erythema or rash.  Neurological:     Mental Status: He is alert and oriented to person, place, and time. Mental status is at baseline.  Psychiatric:        Behavior: Behavior normal.     Comments: Well groomed, good eye contact, normal speech and thoughts    I have personally reviewed the radiology report from 04/03/21 CT Renal.  CT Renal Stone StudyPerformed 04/03/2021 Final result  Study Result CLINICAL DATA: Hematuria, unknown cause. Low back pain radiating  to the right groin. Vomiting.  EXAM: CT ABDOMEN AND PELVIS WITHOUT CONTRAST  TECHNIQUE: Multidetector CT imaging of the abdomen and pelvis was performed following the standard protocol without IV contrast.  COMPARISON: None.  FINDINGS: Lower chest: 5 mm subpleural pulmonary nodule is seen within the a visualized right middle lobe. The visualized lung bases are otherwise clear. Visualized heart and pericardium are unremarkable.  Hepatobiliary: No focal liver abnormality is seen. No gallstones, gallbladder wall thickening, or biliary dilatation.  Pancreas: Unremarkable  Spleen: Unremarkable  Adrenals/Urinary Tract: The adrenal glands are unremarkable. The kidneys are normal in size and position. Mild right hydronephrosis secondary to an obstructing 2 mm calculus within the mid right ureter at the level of the right transverse process of L4. No additional nephro or urolithiasis. No hydronephrosis on the left. The bladder is unremarkable.  Stomach/Bowel: Severe distal descending and sigmoid colonic diverticulosis. The stomach, small bowel, and large bowel are otherwise unremarkable. Appendix normal. No evidence of obstruction or focal inflammation. No free intraperitoneal gas or fluid.  Vascular/Lymphatic: Mild aortoiliac atherosclerotic calcification. No aortic aneurysm. No pathologic adenopathy within the abdomen and pelvis.  Reproductive: Prostate is unremarkable.  Other: No abdominal wall hernia.  Musculoskeletal: Degenerative changes are seen within the lumbar spine. No lytic or blastic bone lesion. No acute bone abnormality.  IMPRESSION: Obstructing 2 mm calculus within the mid right ureter resulting in mild right hydronephrosis.  Distal colonic diverticulosis. No superimposed inflammatory change.  5 mm right middle lobe subpleural pulmonary nodule. No follow-up needed if patient is low-risk. Non-contrast chest CT can be considered in 12 months if patient is  high-risk. This recommendation follows the consensus statement: Guidelines for Management of Incidental Pulmonary Nodules Detected on CT Images: From the Fleischner Society 2017; Radiology 2017; 284:228-243.  Aortic Atherosclerosis (ICD10-I70.0).   Electronically Signed By: Fidela Salisbury M.D. On: 04/03/2021 22:25     Results for orders placed or performed in visit on 05/06/21  POCT Urinalysis Dipstick  Result Value Ref Range   Color, UA Yellow    Clarity, UA Clear    Glucose, UA Negative Negative   Bilirubin, UA Negative    Ketones, UA Negative    Spec Grav, UA 1.020 1.010 - 1.025   Blood, UA Trace    pH, UA 6.0 5.0 - 8.0   Protein, UA Positive (A) Negative   Urobilinogen, UA 0.2 0.2 or 1.0 E.U./dL   Nitrite, UA Negative    Leukocytes, UA Negative Negative   Appearance  Odor        Assessment & Plan:   Problem List Items Addressed This Visit   None Visit Diagnoses     Right nephrolithiasis    -  Primary   Relevant Medications   tamsulosin (FLOMAX) 0.4 MG CAPS capsule   cyclobenzaprine (FLEXERIL) 10 MG tablet   Other Relevant Orders   Ambulatory referral to Urology   Hydronephrosis of right kidney       Relevant Orders   Ambulatory referral to Urology   Urinary frequency       Relevant Orders   POCT Urinalysis Dipstick (Completed)   Urine Culture   Ambulatory referral to Urology   Aortic atherosclerosis (HCC)       Relevant Medications   valsartan (DIOVAN) 80 MG tablet   Urinary urgency       Relevant Orders   Ambulatory referral to Urology   Gross hematuria       Relevant Orders   Ambulatory referral to Urology       Urgent referral for Right ureteral stone with hydronephrosis on CT Renal in ED 04/03/21 for new onset kidney stone, thought to have passed but not confirmed, he has had residual symptoms persisting on R side flank pain episodic and rare instance of gross hematuria again since the time thought to have passed, he is traveling this  weekend, and still endorsing urgency frequency reduced urine output. Requesting Urology for repeat imaging and management of nephrolithiasis.  UA today not suggestive of UTI, some trace blood from likely nephrolithiasis, Urine culture collected, pending.  Hold antibiotics for now empirically.  Ordered Flexeril 5-10mg  PRN if pain, has leftover Oxycodone from ED if need in emergency has Zofran PRN.  Improve hydration.  Return criteria given.  Orders Placed This Encounter  Procedures   Urine Culture   Ambulatory referral to Urology    Referral Priority:   Urgent    Referral Type:   Consultation    Referral Reason:   Specialty Services Required    Requested Specialty:   Urology    Number of Visits Requested:   1   POCT Urinalysis Dipstick     Meds ordered this encounter  Medications   tamsulosin (FLOMAX) 0.4 MG CAPS capsule    Sig: Take 1 capsule (0.4 mg total) by mouth daily. For up to 1-2 weeks for kidney stone, may repeat if need    Dispense:  30 capsule    Refill:  0   cyclobenzaprine (FLEXERIL) 10 MG tablet    Sig: Take 0.5-1 tablets (5-10 mg total) by mouth 3 (three) times daily as needed for muscle spasms (ureteral spasm).    Dispense:  30 tablet    Refill:  0     Follow up plan: Return if symptoms worsen or fail to improve, for keep apt as scheduled.   [name], [facility] 05/06/2021, 8:38 AM

## 2021-05-06 NOTE — Progress Notes (Signed)
05/07/21 3:13 PM   Hyman Hopes 1961-03-04 [id number]  Referring provider:  Olin Hauser, DO [address] [place],  [place] [address] Chief Complaint  Patient presents with   Nephrolithiasis     HPI: Randy Watson is a 60 y.o.male who presents today for further evaluation of urianry symptoms and hydronephrosis of right kidney.   He was recently seen and evaluated in th ED on 04/03/2021 for evaluation of flank pain that was sudden onset of severe sharp right flank pain radiating down to the scrotum associated with nausea. CT renal stone study revealed obstructing 2 mm calculus within the mid right ureter resulting in mild right hydronephrosis. Urinalysis sowed large blood, > 20 WBCs, >50 RBCs and budding yeast was present.   Recent urinalysis on 05/06/2021 was unremarkable besides positive protein.   Stone is not present on his KUB today. Urine today is negative.   He reports that about two week ago he had blood in his urine and started experiencing pain in his lower right back. He has also experienced the urge to urinate as well as frequency . He is also experiencing pain in his testicular area.   PMH: Past Medical History:  Diagnosis Date   Allergy    Anal fissure    Constipation    GERD (gastroesophageal reflux disease)    History of chicken pox    Obesity    Stroke ([place]) 12/2018    Surgical History: Past Surgical History:  Procedure Laterality Date   TEE WITHOUT CARDIOVERSION N/A 05/31/2019   Procedure: TRANSESOPHAGEAL ECHOCARDIOGRAM (TEE);  Surgeon: Corey Skains, MD;  Location: [facility] ORS;  Service: Cardiovascular;  Laterality: N/A;    Home Medications:  Allergies as of 05/07/2021       Reactions   Penicillins    Did it involve swelling of the face/tongue/throat, SOB, or low BP? Unknown Did it involve sudden or severe rash/hives, skin peeling, or any reaction on the inside of your mouth or nose? Unknown Did you need to seek medical  attention at a hospital or doctor's office? Unknown When did it last happen?      childhood allergy If all above answers are "NO", may proceed with cephalosporin use.        Medication List        Accurate as of May 07, 2021  3:13 PM. If you have any questions, ask your nurse or doctor.          STOP taking these medications    oxyCODONE-acetaminophen 5-325 MG tablet Commonly known as: Percocet Stopped by: Hollice Espy, MD       TAKE these medications    acetaminophen 500 MG tablet Commonly known as: TYLENOL Take 1,000 mg by mouth every 6 (six) hours as needed for moderate pain or headache.   aspirin 81 MG EC tablet Take by mouth.   atorvastatin 40 MG tablet Commonly known as: LIPITOR Take 1 tablet (40 mg total) by mouth at bedtime.   cholecalciferol 25 MCG (1000 UNIT) tablet Commonly known as: VITAMIN D3 Take 1,000 Units by mouth at bedtime.   cyclobenzaprine 10 MG tablet Commonly known as: FLEXERIL Take 0.5-1 tablets (5-10 mg total) by mouth 3 (three) times daily as needed for muscle spasms (ureteral spasm).   fluticasone 50 MCG/ACT nasal spray Commonly known as: FLONASE Place 2 sprays into both nostrils daily. Use for 4-6 weeks then stop and use seasonally or as needed.   omeprazole 20 MG tablet Commonly known as:  PRILOSEC OTC Take 20-40 mg by mouth daily as needed (acid reflux).   ondansetron 4 MG disintegrating tablet Commonly known as: Zofran ODT Take 1 tablet (4 mg total) by mouth every 8 (eight) hours as needed.   tamsulosin 0.4 MG Caps capsule Commonly known as: FLOMAX Take 1 capsule (0.4 mg total) by mouth daily. For up to 1-2 weeks for kidney stone, may repeat if need   valsartan 80 MG tablet Commonly known as: DIOVAN Take 80 mg by mouth daily.   vitamin B-12 1000 MCG tablet Commonly known as: CYANOCOBALAMIN Take 1,000 mcg by mouth at bedtime.        Allergies:  Allergies  Allergen Reactions   Penicillins     Did it  involve swelling of the face/tongue/throat, SOB, or low BP? Unknown Did it involve sudden or severe rash/hives, skin peeling, or any reaction on the inside of your mouth or nose? Unknown Did you need to seek medical attention at a hospital or doctor's office? Unknown When did it last happen?      childhood allergy If all above answers are "NO", may proceed with cephalosporin use.     Family History: Family History  Problem Relation Age of Onset   Cancer Father        renal/pancreatic   CAD Father    Diabetes Father    Prostate cancer Maternal Grandfather    Bladder Cancer Neg Hx     Social History:  reports that he has never smoked. He quit smokeless tobacco use about 2 years ago.  His smokeless tobacco use included chew. He reports current alcohol use. He reports that he does not use drugs.   Physical Exam: BP 123/81   Pulse 83   Ht 6\' 2"  (1.88 m)   Wt 267 lb (121.1 kg)   BMI 34.28 kg/m   Constitutional:  Alert and oriented, No acute distress. HEENT: [place] AT, moist mucus membranes.  Trachea midline, no masses. Cardiovascular: No clubbing, cyanosis, or edema. Respiratory: Normal respiratory effort, no increased work of breathing. GU: Nontender bilateral epididymal cyst, normal, no masses,  Skin: No rashes, bruises or suspicious lesions. Neurologic: Grossly intact, no focal deficits, moving all 4 extremities. Psychiatric: Normal mood and affect.  Laboratory Data:  Lab Results  Component Value Date   CREATININE 1.21 04/03/2021    Lab Results  Component Value Date   PSA 0.66 11/08/2020    Lab Results  Component Value Date   HGBA1C 6.4 (H) 11/08/2020    Urinalysis Urine negative today   Pertinent Imaging: CLINICAL DATA:  Hematuria, unknown cause. Low back pain radiating to the right groin. Vomiting.   EXAM: CT ABDOMEN AND PELVIS WITHOUT CONTRAST   TECHNIQUE: Multidetector CT imaging of the abdomen and pelvis was performed following the standard protocol  without IV contrast.   COMPARISON:  None.   FINDINGS: Lower chest: 5 mm subpleural pulmonary nodule is seen within the a visualized right middle lobe. The visualized lung bases are otherwise clear. Visualized heart and pericardium are unremarkable.   Hepatobiliary: No focal liver abnormality is seen. No gallstones, gallbladder wall thickening, or biliary dilatation.   Pancreas: Unremarkable   Spleen: Unremarkable   Adrenals/Urinary Tract: The adrenal glands are unremarkable. The kidneys are normal in size and position. Mild right hydronephrosis secondary to an obstructing 2 mm calculus within the mid right ureter at the level of the right transverse process of L4. No additional nephro or urolithiasis. No hydronephrosis on the left. The bladder is unremarkable.  Stomach/Bowel: Severe distal descending and sigmoid colonic diverticulosis. The stomach, small bowel, and large bowel are otherwise unremarkable. Appendix normal. No evidence of obstruction or focal inflammation. No free intraperitoneal gas or fluid.   Vascular/Lymphatic: Mild aortoiliac atherosclerotic calcification. No aortic aneurysm. No pathologic adenopathy within the abdomen and pelvis.   Reproductive: Prostate is unremarkable.   Other: No abdominal wall hernia.   Musculoskeletal: Degenerative changes are seen within the lumbar spine. No lytic or blastic bone lesion. No acute bone abnormality.   IMPRESSION: Obstructing 2 mm calculus within the mid right ureter resulting in mild right hydronephrosis.   Distal colonic diverticulosis.  No superimposed inflammatory change.   5 mm right middle lobe subpleural pulmonary nodule. No follow-up needed if patient is low-risk. Non-contrast chest CT can be considered in 12 months if patient is high-risk. This recommendation follows the consensus statement: Guidelines for Management of Incidental Pulmonary Nodules Detected on CT Images: From the Fleischner Society  2017; Radiology 2017; 284:228-243.   Aortic Atherosclerosis (ICD10-I70.0).     Electronically Signed   By: Fidela Salisbury M.D.   On: 04/03/2021 22:25  CT scan personally reviewed today, agree with above radiologic interpretation  Assessment & Plan:    Right ureteral calculus  - KUB today showed no stone (ordered and personally reviewed, awaiting radiology interpretation) - Recommend renal ultrasound to further evaluate stone seen on CT to determine its location in light of ongoing symptoms - .Discussed stone treatment options including ESWL and URS pending RUS results, will further discuss when RUS results are reviewed.   2. Right hydronephrosis  - as above   3. Right testicular pain  - Testicular exam today was normal     I,Kailey Littlejohn,acting as a scribe for Hollice Espy, MD.,have documented all relevant documentation on the behalf of Hollice Espy, MD,as directed by  Hollice Espy, MD while in the presence of Hollice Espy, MD.  I have reviewed the above documentation for accuracy and completeness, and I agree with the above.   Hollice Espy, MD   [facility] [address], [place], [place] [address] [phone]  I spent 45 total minutes on the day of the encounter including pre-visit review of the medical record, face-to-face time with the patient, and post visit ordering of labs/imaging/tests.

## 2021-05-06 NOTE — Progress Notes (Signed)
Ok

## 2021-05-07 ENCOUNTER — Encounter: Payer: Self-pay | Admitting: Urology

## 2021-05-07 ENCOUNTER — Ambulatory Visit
Admission: RE | Admit: 2021-05-07 | Discharge: 2021-05-07 | Disposition: A | Discharge status: Home / Self Care | Payer: BC Managed Care – PPO | Attending: Urology | Admitting: Urology

## 2021-05-07 ENCOUNTER — Ambulatory Visit: Payer: BC Managed Care – PPO | Admitting: Urology

## 2021-05-07 ENCOUNTER — Other Ambulatory Visit: Payer: Self-pay | Admitting: *Deleted

## 2021-05-07 ENCOUNTER — Ambulatory Visit
Admission: RE | Admit: 2021-05-07 | Discharge: 2021-05-07 | Disposition: A | Discharge status: Home / Self Care | Payer: BC Managed Care – PPO | Source: Ambulatory Visit | Attending: Urology | Admitting: Urology

## 2021-05-07 VITALS — BP 123/81 | HR 83 | Ht 74.0 in | Wt 267.0 lb

## 2021-05-07 DIAGNOSIS — N200 Calculus of kidney: Secondary | ICD-10-CM

## 2021-05-07 DIAGNOSIS — N1330 Unspecified hydronephrosis: Secondary | ICD-10-CM

## 2021-05-07 LAB — URINE CULTURE
MICRO NUMBER:: 12428801
Result:: NO GROWTH
SPECIMEN QUALITY:: ADEQUATE

## 2021-05-08 LAB — URINALYSIS, COMPLETE
Bilirubin, UA: NEGATIVE
Glucose, UA: NEGATIVE
Ketones, UA: NEGATIVE
Leukocytes,UA: NEGATIVE
Nitrite, UA: NEGATIVE
Protein,UA: NEGATIVE
RBC, UA: NEGATIVE
Specific Gravity, UA: 1.025 (ref 1.005–1.030)
Urobilinogen, Ur: 0.2 mg/dL (ref 0.2–1.0)
pH, UA: 6 (ref 5.0–7.5)

## 2021-05-08 LAB — MICROSCOPIC EXAMINATION
Bacteria, UA: NONE SEEN
WBC, UA: NONE SEEN /hpf (ref 0–5)

## 2021-05-14 ENCOUNTER — Encounter: Payer: Self-pay | Admitting: Urology

## 2021-05-20 ENCOUNTER — Other Ambulatory Visit: Payer: Self-pay

## 2021-05-20 ENCOUNTER — Encounter: Payer: Self-pay | Admitting: Family Medicine

## 2021-05-20 ENCOUNTER — Other Ambulatory Visit: Payer: Self-pay | Admitting: Family Medicine

## 2021-05-20 ENCOUNTER — Ambulatory Visit: Payer: BC Managed Care – PPO | Admitting: Family Medicine

## 2021-05-20 VITALS — BP 124/63 | HR 78 | Ht 74.0 in | Wt 269.4 lb

## 2021-05-20 DIAGNOSIS — N200 Calculus of kidney: Secondary | ICD-10-CM

## 2021-05-20 DIAGNOSIS — R7309 Other abnormal glucose: Secondary | ICD-10-CM

## 2021-05-20 DIAGNOSIS — E559 Vitamin D deficiency, unspecified: Secondary | ICD-10-CM

## 2021-05-20 DIAGNOSIS — M47816 Spondylosis without myelopathy or radiculopathy, lumbar region: Secondary | ICD-10-CM | POA: Diagnosis not present

## 2021-05-20 DIAGNOSIS — G8929 Other chronic pain: Secondary | ICD-10-CM

## 2021-05-20 DIAGNOSIS — Z125 Encounter for screening for malignant neoplasm of prostate: Secondary | ICD-10-CM

## 2021-05-20 DIAGNOSIS — M545 Low back pain, unspecified: Secondary | ICD-10-CM | POA: Diagnosis not present

## 2021-05-20 DIAGNOSIS — E538 Deficiency of other specified B group vitamins: Secondary | ICD-10-CM

## 2021-05-20 DIAGNOSIS — E785 Hyperlipidemia, unspecified: Secondary | ICD-10-CM

## 2021-05-20 DIAGNOSIS — Z0000 Encounter for general adult medical examination without abnormal findings: Secondary | ICD-10-CM

## 2021-05-20 LAB — POCT GLYCOSYLATED HEMOGLOBIN (HGB A1C): Hemoglobin A1C: 5.8 % — AB (ref 4.0–5.6)

## 2021-05-20 NOTE — Progress Notes (Signed)
Subjective:    Patient ID: Randy Watson, male    DOB: 1961/06/09, 60 y.o.   MRN: [id number]  Randy Watson is a 60 y.o. male presenting on 05/20/2021 for Prediabetes and Hypertension   HPI  Lumbar DDD / Chronic Back Pain Hip Pain Followed by [facility]   Elevated A1c / Pre-Diabetes Improved result now. A1c previously 6.3 to 6.4, now he has reduced sweet intake and A1c today is improved Not on medicine   History of CVA, L cerebellar w/ PICA He continues on ASA 81, Lipitor 40mg  Now followed by Cardiology at [facility]    Additional history   Kidney Stone Followed by [facility] Dr Erlene Quan, has upcoming ultrasound, to determine how to resolve. He is still taking Tamsulosin however questioning if this is helping at the moment. He does have difficulty with ejaculation on this medication.  Declines Flu Shot.    Depression screen [facility] 2/9 05/20/2021 12/14/2019 10/11/2019  Decreased Interest 0 0 0  Down, Depressed, Hopeless 0 0 0  PHQ - 2 Score 0 0 0  Altered sleeping 0 - -  Tired, decreased energy 0 - -  Change in appetite 0 - -  Feeling bad or failure about yourself  0 - -  Trouble concentrating 0 - -  Moving slowly or fidgety/restless 0 - -  Suicidal thoughts 0 - -  PHQ-9 Score 0 - -  Difficult doing work/chores Not difficult at all - -    Social History   Tobacco Use   Smoking status: Never   Smokeless tobacco: Former    Types: Chew    Quit date: 01/01/2019   Tobacco comments:    dip  Vaping Use   Vaping Use: Never used  Substance Use Topics   Alcohol use: Yes    Comment: occasionally   Drug use: No    Review of Systems Per HPI unless specifically indicated above     Objective:    BP 124/63   Pulse 78   Ht 6\' 2"  (1.88 m)   Wt 269 lb 6.4 oz (122.2 kg)   SpO2 98%   BMI 34.59 kg/m   Wt Readings from Last 3 Encounters:  05/20/21 269 lb 6.4 oz (122.2 kg)  05/07/21 267 lb (121.1 kg)  05/06/21 267 lb (121.1 kg)    Physical  Exam Vitals and nursing note reviewed.  Constitutional:      General: He is not in acute distress.    Appearance: He is well-developed. He is not diaphoretic.     Comments: Well-appearing, comfortable, cooperative  HENT:     Head: Normocephalic and atraumatic.  Eyes:     General:        Right eye: No discharge.        Left eye: No discharge.     Conjunctiva/sclera: Conjunctivae normal.  Neck:     Thyroid: No thyromegaly.  Cardiovascular:     Rate and Rhythm: Normal rate and regular rhythm.     Pulses: Normal pulses.     Heart sounds: Normal heart sounds. No murmur heard. Pulmonary:     Effort: Pulmonary effort is normal. No respiratory distress.     Breath sounds: Normal breath sounds. No wheezing or rales.  Musculoskeletal:        General: Normal range of motion.     Cervical back: Normal range of motion and neck supple.  Lymphadenopathy:     Cervical: No cervical adenopathy.  Skin:    General: Skin  is warm and dry.     Findings: No erythema or rash.  Neurological:     Mental Status: He is alert and oriented to person, place, and time. Mental status is at baseline.  Psychiatric:        Behavior: Behavior normal.     Comments: Well groomed, good eye contact, normal speech and thoughts    Results for orders placed or performed in visit on 05/20/21  POCT HgB A1C  Result Value Ref Range   Hemoglobin A1C 5.8 (A) 4.0 - 5.6 %      Assessment & Plan:   Problem List Items Addressed This Visit     Spondylosis of lumbar region without myelopathy or radiculopathy   Elevated hemoglobin A1c - Primary    Well-controlled Pre-DM with A1c now improved with lifestyle change A1c 5.8  Plan:  1. Not on any therapy currently  2. Encourage improved lifestyle - low carb, low sugar diet, reduce portion size, continue improving regular exercise      Relevant Orders   POCT HgB A1C (Completed)   Chronic bilateral low back pain without sciatica   Other Visit Diagnoses     Right  nephrolithiasis           Chronic Low back Pain Hip Pain OA/DJD Followed by Orthopedics No change to treatment at this time.  Nephrolithiasis Followed by [facility] Dr Erlene Quan, has had recent imaging including X-ray and [place] and awaiting further follow-up and stone management.  No orders of the defined types were placed in this encounter.     Follow up plan: Return in about 6 months (around 11/18/2021) for 6 month fasting lab only then 1 week later Annual Physical.  Future labs ordered for 11/2021   Randy Watson, [facility] 05/20/2021, 9:10 AM

## 2021-05-20 NOTE — Patient Instructions (Addendum)
Thank you for coming to the office today.  Recent Labs    11/08/20 0818 05/20/21 0900  HGBA1C 6.4* 5.8*   Keep up the great work.  Let me know if need med refills.  Stop taking Tamsulosin since not helping resolve kidney stone for now, if difficulty urinary flow can restart it or let me know if need more.   DUE for FASTING BLOOD WORK (no food or drink after midnight before the lab appointment, only water or coffee without cream/sugar on the morning of)  SCHEDULE "Lab Only" visit in the morning at the clinic for lab draw in 6 MONTHS   - Make sure Lab Only appointment is at about 1 week before your next appointment, so that results will be available  For Lab Results, once available within 2-3 days of blood draw, you can can log in to MyChart online to view your results and a brief explanation. Also, we can discuss results at next follow-up visit.   Please schedule a Follow-up Appointment to: Return in about 6 months (around 11/18/2021) for 6 month fasting lab only then 1 week later Annual Physical.  If you have any other questions or concerns, please feel free to call the office or send a message through [place]. You may also schedule an earlier appointment if necessary.  Additionally, you may be receiving a survey about your experience at our office within a few days to 1 week by e-mail or mail. We value your feedback.  [name], DO [place]

## 2021-05-20 NOTE — Assessment & Plan Note (Signed)
Well-controlled Pre-DM with A1c now improved with lifestyle change A1c 5.8  Plan:  1. Not on any therapy currently  2. Encourage improved lifestyle - low carb, low sugar diet, reduce portion size, continue improving regular exercise

## 2021-05-26 ENCOUNTER — Other Ambulatory Visit: Payer: Self-pay

## 2021-05-26 ENCOUNTER — Ambulatory Visit
Admission: RE | Admit: 2021-05-26 | Discharge: 2021-05-26 | Disposition: A | Discharge status: Home / Self Care | Payer: BC Managed Care – PPO | Source: Ambulatory Visit | Attending: Urology | Admitting: Urology

## 2021-05-26 DIAGNOSIS — N1330 Unspecified hydronephrosis: Secondary | ICD-10-CM | POA: Insufficient documentation

## 2021-05-28 ENCOUNTER — Other Ambulatory Visit: Payer: Self-pay | Admitting: Family Medicine

## 2021-05-28 DIAGNOSIS — N200 Calculus of kidney: Secondary | ICD-10-CM

## 2021-05-28 NOTE — Telephone Encounter (Signed)
Requested medication (s) are due for refill today - no  Requested medication (s) are on the active medication list -yes  Future visit scheduled -yes  Last refill: 05/06/21  Notes to clinic: Request RF: Rx discontinued- no longer on current medication list  Requested Prescriptions  Pending Prescriptions Disp Refills   tamsulosin (FLOMAX) 0.4 MG CAPS capsule [Pharmacy Med Name: TAMSULOSIN HCL 0.4 MG CAPSULE] 30 capsule 0    Sig: TAKE 1 CAPSULE (0.4 MG TOTAL) BY MOUTH DAILY. FOR UP TO 1-2 WEEKS FOR KIDNEY STONE, MAY REPEAT IF NEEDED.     Urology: Alpha-Adrenergic Blocker Passed - 05/28/2021  8:31 AM      Passed - Last BP in normal range    BP Readings from Last 1 Encounters:  05/20/21 124/63          Passed - Valid encounter within last 12 months    Recent Outpatient Visits           1 week ago Elevated hemoglobin A1c   [facility] [place], Devonne Doughty, DO   3 weeks ago Right nephrolithiasis   [place], DO   6 months ago Annual physical exam   [place], DO   1 year ago Chronic bilateral low back pain without sciatica   [place], DO   1 year ago Eustachian tube dysfunction, left   [place], DO       Future Appointments             In 6 months Parks Ranger, Devonne Doughty, DO [facility], [facility]               Requested Prescriptions  Pending Prescriptions Disp Refills   tamsulosin (FLOMAX) 0.4 MG CAPS capsule [Pharmacy Med Name: TAMSULOSIN HCL 0.4 MG CAPSULE] 30 capsule 0    Sig: TAKE 1 CAPSULE (0.4 MG TOTAL) BY MOUTH DAILY. FOR UP TO 1-2 WEEKS FOR KIDNEY STONE, MAY REPEAT IF NEEDED.     Urology: Alpha-Adrenergic Blocker Passed - 05/28/2021  8:31 AM      Passed - Last BP in normal range    BP Readings from Last 1 Encounters:  05/20/21 124/63           Passed - Valid encounter within last 12 months    Recent Outpatient Visits           1 week ago Elevated hemoglobin A1c   [place], DO   3 weeks ago Right nephrolithiasis   [place], DO   6 months ago Annual physical exam   [place], DO   1 year ago Chronic bilateral low back pain without sciatica   [place], DO   1 year ago Eustachian tube dysfunction, left   [place], DO       Future Appointments             In 6 months Parks Ranger, Devonne Doughty, DO [facility], [facility]

## 2021-05-29 ENCOUNTER — Telehealth: Payer: Self-pay | Admitting: *Deleted

## 2021-05-29 NOTE — Telephone Encounter (Signed)
I believe this is your patient

## 2021-05-29 NOTE — Telephone Encounter (Addendum)
Patient informed, voiced understanding.   ----- Message from Hollice Espy, MD sent at 05/28/2021 11:39 AM EDT ----- Renal ultrasound was personally reviewed.  Both kidneys appear to be decompressed and there is no further swelling.  This is suggestive that the stone has passed.  If you are still having pain, I like to reassess you in the office.  Otherwise, we can be reasonably reassured by this study.  Hollice Espy, MD

## 2021-08-19 ENCOUNTER — Ambulatory Visit: Payer: BC Managed Care – PPO | Admitting: Family Medicine

## 2021-08-19 ENCOUNTER — Encounter: Payer: Self-pay | Admitting: Family Medicine

## 2021-08-19 ENCOUNTER — Other Ambulatory Visit: Payer: Self-pay

## 2021-08-19 VITALS — BP 138/78 | HR 68 | Ht 74.0 in | Wt 279.0 lb

## 2021-08-19 DIAGNOSIS — I10 Essential (primary) hypertension: Secondary | ICD-10-CM

## 2021-08-19 DIAGNOSIS — R519 Headache, unspecified: Secondary | ICD-10-CM | POA: Diagnosis not present

## 2021-08-19 DIAGNOSIS — M549 Dorsalgia, unspecified: Secondary | ICD-10-CM | POA: Diagnosis not present

## 2021-08-19 DIAGNOSIS — M25511 Pain in right shoulder: Secondary | ICD-10-CM

## 2021-08-19 DIAGNOSIS — M47816 Spondylosis without myelopathy or radiculopathy, lumbar region: Secondary | ICD-10-CM | POA: Diagnosis not present

## 2021-08-19 DIAGNOSIS — N200 Calculus of kidney: Secondary | ICD-10-CM

## 2021-08-19 DIAGNOSIS — M62830 Muscle spasm of back: Secondary | ICD-10-CM

## 2021-08-19 DIAGNOSIS — G8929 Other chronic pain: Secondary | ICD-10-CM

## 2021-08-19 LAB — BASIC METABOLIC PANEL WITH GFR
BUN: 14 mg/dL (ref 7–25)
CO2: 28 mmol/L (ref 20–32)
Calcium: 9.6 mg/dL (ref 8.6–10.3)
Chloride: 102 mmol/L (ref 98–110)
Creat: 0.77 mg/dL (ref 0.70–1.35)
Glucose, Bld: 131 mg/dL (ref 65–139)
Potassium: 4.5 mmol/L (ref 3.5–5.3)
Sodium: 138 mmol/L (ref 135–146)
eGFR: 102 mL/min/{1.73_m2} (ref >=60)

## 2021-08-19 MED ORDER — CYCLOBENZAPRINE HCL 10 MG PO TABS: 5.0000 mg | ORAL_TABLET | Freq: Three times a day (TID) | ORAL | 2 refills | Status: DC | PRN

## 2021-08-19 MED ORDER — NAPROXEN 500 MG PO TABS: 500.0000 mg | ORAL_TABLET | Freq: Two times a day (BID) | ORAL | 0 refills | Status: DC

## 2021-08-19 MED ORDER — VALSARTAN 80 MG PO TABS: 80.0000 mg | ORAL_TABLET | Freq: Every day | ORAL | 3 refills | Status: DC

## 2021-08-19 NOTE — Progress Notes (Signed)
Subjective:    Patient ID: Randy Watson, male    DOB: 20-Aug-1960, 61 y.o.   MRN: [id number]  Randy Watson is a 61 y.o. male presenting on 08/19/2021 for Back Pain   HPI  RIGHT Mid Back pain Back Spasms  Known OA/DDD Lumbar Spine Chronic R Shoulder Pain Followed by Michel Harrow Has had treatment and management for back and shoulder before. Also hip. Prior injections  Now complaint recently of worsening R mid to low back pain vs flank pain. It is very positional he describes worse with rotation movements and activities. Not pain at rest usually. He has had history of various kidney related issues nephrolithiasis and hydronephrosis those are resolved, last Renal US 05/2021 normal.  Episodic headache Mild elevated BP today 150, he is on HTN medication. ARB Valsartan 80mg  daily. Abnormal weight gain   Depression screen [facility] 2/9 08/19/2021 05/20/2021 12/14/2019  Decreased Interest 0 0 0  Down, Depressed, Hopeless 0 0 0  PHQ - 2 Score 0 0 0  Altered sleeping 0 0 -  Tired, decreased energy 0 0 -  Change in appetite 0 0 -  Feeling bad or failure about yourself  0 0 -  Trouble concentrating 0 0 -  Moving slowly or fidgety/restless 0 0 -  Suicidal thoughts 0 0 -  PHQ-9 Score 0 0 -  Difficult doing work/chores Not difficult at all Not difficult at all -    Social History   Tobacco Use   Smoking status: Never   Smokeless tobacco: Former    Types: Chew    Quit date: 01/01/2019   Tobacco comments:    dip  Vaping Use   Vaping Use: Never used  Substance Use Topics   Alcohol use: Yes    Comment: occasionally   Drug use: No    Review of Systems Per HPI unless specifically indicated above     Objective:    BP (!) 153/75    Pulse 68    Ht 6\' 2"  (1.88 m)    Wt 279 lb (126.6 kg)    SpO2 100%    BMI 35.82 kg/m   Wt Readings from Last 3 Encounters:  08/19/21 279 lb (126.6 kg)  05/20/21 269 lb 6.4 oz (122.2 kg)  05/07/21 267 lb (121.1 kg)    Physical  Exam Vitals and nursing note reviewed.  Constitutional:      General: He is not in acute distress.    Appearance: Normal appearance. He is well-developed. He is not diaphoretic.     Comments: Well-appearing, comfortable, cooperative  HENT:     Head: Normocephalic and atraumatic.  Eyes:     General:        Right eye: No discharge.        Left eye: No discharge.     Conjunctiva/sclera: Conjunctivae normal.  Cardiovascular:     Rate and Rhythm: Normal rate.  Pulmonary:     Effort: Pulmonary effort is normal.  Musculoskeletal:     Comments: Low Back Inspection: Normal appearance, Large body habitus, no spinal deformity, symmetrical. Palpation: No tenderness over spinous processes. Mild reproducible tenderness R mid back muscles with some hypertonicity and spasm into R flank. Bilateral lumbar paraspinal muscles non-tender and without hypertonicity/spasm. ROM: Full active ROM forward flex / back extension, rotation L/R without discomfort - rotation R and Left back with pain Special Testing: Seated SLR negative for radicular pain bilaterally  Strength: Bilateral hip flex/ext 5/5, knee flex/ext 5/5, ankle dorsiflex/plantarflex 5/5 Neurovascular:  intact distal sensation to light touch   Skin:    General: Skin is warm and dry.     Findings: No erythema or rash.  Neurological:     Mental Status: He is alert and oriented to person, place, and time.  Psychiatric:        Mood and Affect: Mood normal.        Behavior: Behavior normal.        Thought Content: Thought content normal.     Comments: Well groomed, good eye contact, normal speech and thoughts   Results for orders placed or performed in visit on 05/20/21  POCT HgB A1C  Result Value Ref Range   Hemoglobin A1C 5.8 (A) 4.0 - 5.6 %      Assessment & Plan:   Problem List Items Addressed This Visit     Spondylosis of lumbar region without myelopathy or radiculopathy   Relevant Medications   naproxen (NAPROSYN) 500 MG tablet    cyclobenzaprine (FLEXERIL) 10 MG tablet   Other Relevant Orders   Ambulatory referral to Sports Medicine   Other Visit Diagnoses     Mid back pain on right side    -  Primary   Relevant Medications   naproxen (NAPROSYN) 500 MG tablet   cyclobenzaprine (FLEXERIL) 10 MG tablet   Other Relevant Orders   Ambulatory referral to Sports Medicine   Muscle spasm of back       Relevant Orders   Ambulatory referral to Sports Medicine   Nonintractable headache, unspecified chronicity pattern, unspecified headache type       Relevant Medications   naproxen (NAPROSYN) 500 MG tablet   cyclobenzaprine (FLEXERIL) 10 MG tablet   Chronic right shoulder pain       Relevant Medications   naproxen (NAPROSYN) 500 MG tablet   cyclobenzaprine (FLEXERIL) 10 MG tablet   Other Relevant Orders   Ambulatory referral to Sports Medicine   Essential hypertension       Relevant Medications   valsartan (DIOVAN) 80 MG tablet   Other Relevant Orders   BASIC METABOLIC PANEL WITH GFR   Right nephrolithiasis       Relevant Medications   cyclobenzaprine (FLEXERIL) 10 MG tablet       HTN Elevated BP mild, repeat manual improved Refill Valsartan 80mg  daily Suspect headache from back pain can be from BP, he will check BP more regularly Check Chemistry BMET today only for Cr trend w/ history of kidney dysfunction and hydronephrosis Can adjust BP med if indicated  R Back Pain / Flank pain Back spasms Likely MSK etiology given location and positional nature. He has extensive other orthopedic/ MSK issues with R shoulder, R low back, R hip. Likely I suspect he has some secondary muscle symptoms due compensation from these other issues, impacting his back muscles overall.  Rx Flexeril 5-10mg  QHS PRN caution sedation Naproxen 500 BID 1-2 weeks, CAUTION with oral nsaid, only SHORT TERM. Use Tylenol PRN Topical Voltaren PRN if need  Referral to Sports Med given related nature of his R Shoulder / Back issues and  mostly symptoms now more soft tissue MSK etiology, will get further eval and patient does not reques any surgical intervention  Orders Placed This Encounter  Procedures   BASIC METABOLIC PANEL WITH GFR   Ambulatory referral to Sports Medicine    Referral Priority:   Routine    Referral Type:   Consultation    Number of Visits Requested:   1  Meds ordered this encounter  Medications   naproxen (NAPROSYN) 500 MG tablet    Sig: Take 1 tablet (500 mg total) by mouth 2 (two) times daily with a meal. For 1-2 weeks then as needed    Dispense:  60 tablet    Refill:  0   cyclobenzaprine (FLEXERIL) 10 MG tablet    Sig: Take 0.5-1 tablets (5-10 mg total) by mouth 3 (three) times daily as needed for muscle spasms (ureteral spasm).    Dispense:  30 tablet    Refill:  2   valsartan (DIOVAN) 80 MG tablet    Sig: Take 1 tablet (80 mg total) by mouth daily.    Dispense:  90 tablet    Refill:  3      Follow up plan: Return if symptoms worsen or fail to improve.   [name], [facility] 08/19/2021, 9:03 AM

## 2021-08-19 NOTE — Patient Instructions (Addendum)
Thank you for coming to the office today.  Keep an eye on BP readings at home. Goal < 140 / 90. Refilled med, if still elevated / headache we can increase med if need  Likely headache from pain and or BP  Keep on Flexeril as needed at night.  Recommend to start taking Tylenol Extra Strength 500mg  tabs - take 1 to 2 tabs per dose (max 1000mg ) every 6-8 hours for pain (take regularly, don't skip a dose for next 7 days), max 24 hour daily dose is 6 tablets or 3000mg . In the future you can repeat the same everyday Tylenol course for 1-2 weeks at a time.   Recommend trial of Anti-inflammatory with Naproxen (Naprosyn) 500mg  tabs - take one with food and plenty of water TWICE daily every day (breakfast and dinner), for next 1 to 2 weeks, then you may take only as needed - DO NOT TAKE any ibuprofen, aleve, motrin while you are taking this medicine - It is safe to take Tylenol Ext Str 500mg  tabs - take 1 to 2 (max dose 1000mg ) every 6 hours as needed for breakthrough pain, max 24 hour daily dose is 6 to 8 tablets or 4000mg   START anti inflammatory topical - OTC Voltaren (generic Diclofenac) topical 2-4 times a day as needed for pain swelling of affected joint for 1-2 weeks or longer.  Lab test today for kidney function  Sports Medicine - [place], MD Address: [address] [place], [place] [address] Phone: [phone]   Please schedule a Follow-up Appointment to: Return if symptoms worsen or fail to improve.  If you have any other questions or concerns, please feel free to call the office or send a message through [place]. You may also schedule an earlier appointment if necessary.  Additionally, you may be receiving a survey about your experience at our office within a few days to 1 week by e-mail or mail. We value your feedback.  [name], DO [place]

## 2021-08-20 ENCOUNTER — Ambulatory Visit: Payer: BC Managed Care – PPO | Admitting: Family Medicine

## 2021-08-20 ENCOUNTER — Encounter: Payer: Self-pay | Admitting: Family Medicine

## 2021-08-20 VITALS — BP 120/78 | HR 77 | Ht 74.0 in | Wt 278.0 lb

## 2021-08-20 DIAGNOSIS — G2589 Other specified extrapyramidal and movement disorders: Secondary | ICD-10-CM | POA: Diagnosis not present

## 2021-08-20 DIAGNOSIS — M67911 Unspecified disorder of synovium and tendon, right shoulder: Secondary | ICD-10-CM | POA: Insufficient documentation

## 2021-08-20 NOTE — Assessment & Plan Note (Signed)
Right-hand-dominant patient presenting with chronic right shoulder pain, recent worsening over the past few months, atraumatic in onset.  He is highly active at baseline athletics (baseball).  His findings today show focality to the external rotators where there is 4+/5 strength, recreation of pain, he has positive Neer's and Hawkins, minimally tender biceps tendon at the bicipital groove, and tenderness throughout the musculature surrounding the scapula medially with focality to the rhomboid major.  Negative Spurling's test and he is neurovascularly intact in the upper extremities otherwise.  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  I have outlined same as well as treatment strategies moving forward.  From a medication management standpoint, I have advised the patient to dose Rx naproxen 500 mg scheduled until follow-up, dose cyclobenzaprine nightly scheduled until this weekend then transition to nightly as needed, start a home-based rehab program with a focus on range of motion if symptoms allow, and maintain close follow-up in 2 weeks time for reevaluation.  Pending symptoms at return, can consider ultrasound-guided subacromial corticosteroid injection and/or trigger point injections if indicated.  Once symptoms allow, plan for formal physical therapy.

## 2021-08-20 NOTE — Progress Notes (Signed)
°  ° °  Primary Care / Sports Medicine Office Visit  Patient Information:  Patient ID: Randy Watson, male DOB: 1961-02-16 Age: 61 y.o. MRN: [id number]   Randy Watson is a pleasant 61 y.o. male presenting with the following:  Chief Complaint  Patient presents with   Back Pain    X 5 months, Mid back on Right side, no x rays, has went to a chiropractor, has had CT scan for kidney stones 2022 showed degenerative disk, had a fall last summer landed on his back    Shoulder Pain    Right handed, X 2 years, Xray 2021, [facility], right shoulder     Patient Active Problem List   Diagnosis Date Noted   Tendinopathy of rotator cuff, right 08/20/2021   Scapular dyskinesis 08/20/2021   Morbid obesity ([place]) 11/15/2020   Vitamin B12 deficiency 12/14/2019   Vitamin D deficiency 12/14/2019   Elevated hemoglobin A1c 12/14/2019   Spondylosis of lumbar region without myelopathy or radiculopathy 12/14/2019   Chronic bilateral low back pain without sciatica 12/14/2019   Dyslipidemia 04/12/2019   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  No results found.   Independent interpretation of notes and tests performed by another provider:   None  Procedures performed:   None  Pertinent History, Exam, Impression, and Recommendations:   Tendinopathy of rotator cuff, right Right-hand-dominant patient presenting with chronic right shoulder pain, recent worsening over the past few months, atraumatic in onset.  He is highly active at baseline athletics (baseball).  His findings today show focality to the external rotators where there is 4+/5 strength, recreation of pain, he has positive Neer's and Hawkins, minimally tender biceps  tendon at the bicipital groove, and tenderness throughout the musculature surrounding the scapula medially with focality to the rhomboid major.  Negative Spurling's test and he is neurovascularly intact in the upper extremities otherwise.  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  I have outlined same as well as treatment strategies moving forward.  From a medication management standpoint, I have advised the patient to dose Rx naproxen 500 mg scheduled until follow-up, dose cyclobenzaprine nightly scheduled until this weekend then transition to nightly as needed, start a home-based rehab program with a focus on range of motion if symptoms allow, and maintain close follow-up in 2 weeks time for reevaluation.  Pending symptoms at return, can consider ultrasound-guided subacromial corticosteroid injection and/or trigger point injections if indicated.  Once symptoms allow, plan for formal physical therapy.  Scapular dyskinesis Secondary scapular dyskinesis in the setting of chronic right rotator cuff tendinopathy, this is a chronic issue, see additional assessment(s) for plan details.   Orders & Medications No orders of the defined types were placed in this encounter.  No orders of the defined types were placed in this encounter.    Return in about 2 weeks (around 09/03/2021).     Montel Culver, MD   Primary Care Sports Medicine [place]

## 2021-08-20 NOTE — Assessment & Plan Note (Signed)
Secondary scapular dyskinesis in the setting of chronic right rotator cuff tendinopathy, this is a chronic issue, see additional assessment(s) for plan details.

## 2021-08-20 NOTE — Patient Instructions (Signed)
-   Dose naproxen 500 mg every a.m. and every p.m. scheduled (take with food) until follow-up - Dose nightly cyclobenzaprine until this weekend and dose nightly on an as-needed basis (side effect and be drowsiness) - Apply heat, alternating with ice at 20-minute intervals for additional symptom control - Restrict activities to simple daily living (avoid athletics) - If shoulder symptoms fail to show any improvement by the end of this week/early next week, contact our office for next steps - Return for follow-up in 2 weeks, contact for questions

## 2021-08-24 ENCOUNTER — Other Ambulatory Visit: Payer: Self-pay | Admitting: Family Medicine

## 2021-08-24 DIAGNOSIS — N200 Calculus of kidney: Secondary | ICD-10-CM

## 2021-08-24 NOTE — Telephone Encounter (Signed)
last RF 08/20/21 Completed course Kieandra Person CMA  Requested Prescriptions  Refused Prescriptions Disp Refills   tamsulosin (FLOMAX) 0.4 MG CAPS capsule [Pharmacy Med Name: TAMSULOSIN HCL 0.4 MG CAPSULE] 90 capsule     Sig: TAKE 1 CAPSULE BY MOUTH DAILY. FOR UP TO 1-2 WEEKS FOR KIDNEY STONE, MAY REPEAT IF NEEDED.     Urology: Alpha-Adrenergic Blocker Passed - 08/24/2021  9:32 AM      Passed - Last BP in normal range    BP Readings from Last 1 Encounters:  08/20/21 120/78         Passed - Valid encounter within last 12 months    Recent Outpatient Visits          4 days ago Tendinopathy of rotator cuff, right   [facility] Mont[place] Culver, MD   5 days ago Mid back pain on right side   [place], DO   3 months ago Elevated hemoglobin A1c   [place], DO   3 months ago Right nephrolithiasis   [place], DO   9 months ago Annual physical exam   [place], DO      Future Appointments            In 1 week Zigmund Daniel, Earley Abide, MD [facility] [place]   In 3 months Parks Ranger, Devonne Doughty, [facility], [facility]

## 2021-09-03 ENCOUNTER — Other Ambulatory Visit: Payer: Self-pay

## 2021-09-03 ENCOUNTER — Encounter: Payer: Self-pay | Admitting: Family Medicine

## 2021-09-03 ENCOUNTER — Ambulatory Visit: Payer: BC Managed Care – PPO | Admitting: Family Medicine

## 2021-09-03 VITALS — BP 122/76 | HR 71 | Ht 74.0 in | Wt 276.0 lb

## 2021-09-03 DIAGNOSIS — M549 Dorsalgia, unspecified: Secondary | ICD-10-CM | POA: Diagnosis not present

## 2021-09-03 DIAGNOSIS — M67911 Unspecified disorder of synovium and tendon, right shoulder: Secondary | ICD-10-CM | POA: Diagnosis not present

## 2021-09-03 DIAGNOSIS — G2589 Other specified extrapyramidal and movement disorders: Secondary | ICD-10-CM | POA: Diagnosis not present

## 2021-09-03 MED ORDER — NAPROXEN 500 MG PO TABS: 500.0000 mg | ORAL_TABLET | Freq: Two times a day (BID) | ORAL | 0 refills | Status: DC | PRN

## 2021-09-03 NOTE — Progress Notes (Signed)
°  ° °  Primary Care / Sports Medicine Office Visit  Patient Information:  Patient ID: Randy Watson, male DOB: [birth year] Age: 61 y.o. MRN: [id number]   Randy Watson is a pleasant 61 y.o. male presenting with the following:  Chief Complaint  Patient presents with   Tendinopathy of rotator cuff, right    Pt feels a little better, still doesn't use arm as much, pain level 1 today     Vitals:   09/03/21 0801  BP: 122/76  Pulse: 71  SpO2: 97%   Vitals:   09/03/21 0801  Weight: 276 lb (125.2 kg)  Height: 6\' 2"  (1.88 m)   Body mass index is 35.44 kg/m.  No results found.   Independent interpretation of notes and tests performed by another provider:   None  Procedures performed:   None  Pertinent History, Exam, Impression, and Recommendations:   Tendinopathy of rotator cuff, right Ms. Sackmann is a right-hand dominant patient presenting for follow-up to right rotator cuff tendinopathy, chronic in nature with exacerbation ongoing for the past few months.  As last visit he was noted to have focality to the external rotators where there was 4+/5 weakness with pain, secondary findings of the biceps tendon and periscapular musculature.  He was advised scheduled naproxen and scheduled cyclobenzaprine for a few days then as needed dosing.  He ended up dosing naproxen every other day and did not dose cyclobenzaprine.  He presents today stating that pain is slightly improved, not utilizing the arm much, describes 1/10 pain today.  Examination does show persistent weakness, 4+/5 with external rotation, diminished pain over interval visit, equivocal Neer's, positive Hawkins, stable tenderness at the biceps tendon, and now nontender periscapular musculature.  Given his progress I have discussed next steps inclusive of rehabilitation, either home-based or formal.  At this stage she is amenable to home-based/self-directed rehabilitation, medication management conducted and I have  advised patient to continue naproxen on a as needed basis, discontinue cyclobenzaprine, and have him return for follow-up in 4 weeks.  If suboptimal progress noted at 4 weeks despite here see above, consideration of changing his NSAID, ultrasound-guided subacromial corticosteroid injection, advanced imaging, and discussed for formal physical therapy.  Scapular dyskinesis Patient with known chronic right shoulder pain noted to be in the setting of chronic right rotator cuff tendinopathy with secondary/compensatory scapular dyskinesis.  At the last visit he was noted to have tenderness throughout the rhomboids.  He was advised NSAIDs, skeletal muscle relaxer, and relative rest.  He does the NSAIDs sporadically and did not dose the skeletal muscle relaxer.  At today's visit he notes improvement in his symptoms though not resolution.  Examination is evident for nontender parascapular musculature but mild spasm is noted.  I have advised patient to discontinue cyclobenzaprine given improvement, to continue naproxen on a as needed basis, and start dedicated home-based rehab.  Chronic condition, stable, medication management   Orders & Medications Meds ordered this encounter  Medications   naproxen (NAPROSYN) 500 MG tablet    Sig: Take 1 tablet (500 mg total) by mouth 2 (two) times daily as needed (pain).    Dispense:  60 tablet    Refill:  0   No orders of the defined types were placed in this encounter.    Return in about 4 weeks (around 10/01/2021).     Montel Culver, MD   Primary Care Sports Medicine [place]

## 2021-09-03 NOTE — Patient Instructions (Signed)
-   Start home exercises with information provided - Continue naproxen up to twice a day on an as-needed basis for shoulder pain - Gradually ramp up activity as shoulder symptoms allow - Contact us for any symptoms that prohibit you from progressing - Otherwise, follow-up in 4 weeks

## 2021-09-03 NOTE — Assessment & Plan Note (Signed)
Ms. Monger is a right-hand dominant patient presenting for follow-up to right rotator cuff tendinopathy, chronic in nature with exacerbation ongoing for the past few months.  As last visit he was noted to have focality to the external rotators where there was 4+/5 weakness with pain, secondary findings of the biceps tendon and periscapular musculature.  He was advised scheduled naproxen and scheduled cyclobenzaprine for a few days then as needed dosing.  He ended up dosing naproxen every other day and did not dose cyclobenzaprine.  He presents today stating that pain is slightly improved, not utilizing the arm much, describes 1/10 pain today.  Examination does show persistent weakness, 4+/5 with external rotation, diminished pain over interval visit, equivocal Neer's, positive Hawkins, stable tenderness at the biceps tendon, and now nontender periscapular musculature.  Given his progress I have discussed next steps inclusive of rehabilitation, either home-based or formal.  At this stage she is amenable to home-based/self-directed rehabilitation, medication management conducted and I have advised patient to continue naproxen on a as needed basis, discontinue cyclobenzaprine, and have him return for follow-up in 4 weeks.  If suboptimal progress noted at 4 weeks despite here see above, consideration of changing his NSAID, ultrasound-guided subacromial corticosteroid injection, advanced imaging, and discussed for formal physical therapy.

## 2021-09-03 NOTE — Assessment & Plan Note (Signed)
Patient with known chronic right shoulder pain noted to be in the setting of chronic right rotator cuff tendinopathy with secondary/compensatory scapular dyskinesis.  At the last visit he was noted to have tenderness throughout the rhomboids.  He was advised NSAIDs, skeletal muscle relaxer, and relative rest.  He does the NSAIDs sporadically and did not dose the skeletal muscle relaxer.  At today's visit he notes improvement in his symptoms though not resolution.  Examination is evident for nontender parascapular musculature but mild spasm is noted.  I have advised patient to discontinue cyclobenzaprine given improvement, to continue naproxen on a as needed basis, and start dedicated home-based rehab.  Chronic condition, stable, medication management

## 2021-10-01 ENCOUNTER — Ambulatory Visit: Payer: BC Managed Care – PPO | Admitting: Family Medicine

## 2021-10-01 ENCOUNTER — Inpatient Hospital Stay: Payer: Self-pay | Admitting: Radiology

## 2021-10-01 ENCOUNTER — Other Ambulatory Visit: Payer: Self-pay

## 2021-10-01 ENCOUNTER — Encounter: Payer: Self-pay | Admitting: Family Medicine

## 2021-10-01 VITALS — BP 124/82 | HR 66 | Ht 74.0 in | Wt 280.0 lb

## 2021-10-01 DIAGNOSIS — G2589 Other specified extrapyramidal and movement disorders: Secondary | ICD-10-CM

## 2021-10-01 DIAGNOSIS — M67911 Unspecified disorder of synovium and tendon, right shoulder: Secondary | ICD-10-CM | POA: Diagnosis not present

## 2021-10-01 MED ORDER — TRIAMCINOLONE ACETONIDE 40 MG/ML IJ SUSP
80.0000 mg | Freq: Once | INTRAMUSCULAR | Status: AC
Start: 2021-10-01 — End: 2021-10-01
  Administered 2021-10-01: 80 mg

## 2021-10-01 NOTE — Addendum Note (Signed)
Addended by: Forbes Cellar on: 10/01/2021 10:30 AM   Modules accepted: Orders

## 2021-10-01 NOTE — Progress Notes (Signed)
Primary Care / Sports Medicine Office Visit  Patient Information:  Patient ID: Randy Watson, male DOB: [birth year] Age: 61 y.o. MRN: [id number]   Randy Watson is a pleasant 61 y.o. male presenting with the following:  Chief Complaint  Patient presents with   Tendinopathy of rotator cuff, right    Vitals:   10/01/21 0803  BP: 124/82  Pulse: 66  SpO2: 97%   Vitals:   10/01/21 0803  Weight: 280 lb (127 kg)  Height: 6\' 2"  (1.88 m)   Body mass index is 35.95 kg/m.  No results found.   Independent interpretation of notes and tests performed by another provider:   None  Procedures performed:   Procedure:  Injection of right subacromial space under ultrasound guidance. Ultrasound guidance utilized for visualization of the supraspinatus, no significant sonographic abnormality noted, no effusion, hypoechoic response from injectate noted Samsung HS60 device utilized with permanent recording / reporting. Verbal informed consent obtained and verified. Skin prepped in a sterile fashion. Ethyl chloride for topical local analgesia.  Completed without difficulty and tolerated well. Medication: triamcinolone acetonide 40 mg/mL suspension for injection 1 mL total and 2 mL lidocaine 1% without epinephrine utilized for needle placement anesthetic Advised to contact for fevers/chills, erythema, induration, drainage, or persistent bleeding.  Procedure: A trigger point injection was performed at the site of maximal tenderness in one muscle group, right rhomboid major, using 1% plain Lidocaine and Kenalog. This was well tolerated, and post care was reviewed.   Pertinent History, Exam, Impression, and Recommendations:   Tendinopathy of rotator cuff, right Randy Watson presents for follow-up to right shoulder pain in the setting of right rotator cuff tendinopathy, chronic condition with exacerbation ongoing for the past few months.  At his last visit on 09/03/2021 he had  demonstrated interval progress though is still symptomatic, was amenable to a home-based rehab program, and naproxen as needed, cyclobenzaprine was discontinued.  Over the interim he states that symptoms have mildly improved, it has been difficult for him to regularly perform home exercises but he has been doing them, finds that he requires naproxen roughly twice daily.  He has not attempted overhead athletics so he states that it is hard to assess full progress. His examination does show pain with overhead ROM and resisted RC testing, no weakness noted, +impingement, tenderness primarily at the lateral subacromial, secondarily to the inferior medial border of the right scapula, palpable trigger point, lastly to the bicipital groove/biceps tendon.  Given his clinical course, findings today, we discussed additional treatment strategies and he did elect to proceed with right shoulder subacromial corticosteroid injection under ultrasound guidance and right medial scapular border trigger point injection.  Post care reviewed and need for continued rehab advised.  He is to contact us at the 2-4-week mark for any suboptimal progress at which stage advanced imaging to be considered, can also consider formal PT, and medication management.  Chronic condition, symptomatic, OTC medications  Scapular dyskinesis Patient presents for follow-up to right shoulder pain, has been noted to have secondary/compensatory scapular dyskinesis.  On today's examination he has a focal region of tenderness and palpable trigger point at the right inferior medial scapular border, along the rhomboid major.  Given his persistent symptomatology, he did elect to proceed with trigger point injections about the rhomboid major, he tolerated this well, post care reviewed, and he was advised to continue home exercises and as needed naproxen.  He is to reach out to [place] in 2-4 weeks  for any suboptimal progress.  At which time, he may benefit from  PT versus medication management for this region.  Advanced imaging of the right shoulder can be considered as this is the primary site of his symptoms.   Orders & Medications No orders of the defined types were placed in this encounter.  Orders Placed This Encounter  Procedures   [place] LIMITED JOINT SPACE STRUCTURES UP RIGHT     No follow-ups on file.     Montel Culver, MD   Primary Care Sports Medicine [place]

## 2021-10-01 NOTE — Assessment & Plan Note (Signed)
Patient presents for follow-up to right shoulder pain, has been noted to have secondary/compensatory scapular dyskinesis.  On today's examination he has a focal region of tenderness and palpable trigger point at the right inferior medial scapular border, along the rhomboid major.  Given his persistent symptomatology, he did elect to proceed with trigger point injections about the rhomboid major, he tolerated this well, post care reviewed, and he was advised to continue home exercises and as needed naproxen.  He is to reach out to [place] in 2-4 weeks for any suboptimal progress.  At which time, he may benefit from PT versus medication management for this region.  Advanced imaging of the right shoulder can be considered as this is the primary site of his symptoms.

## 2021-10-01 NOTE — Assessment & Plan Note (Signed)
Mr. Weed presents for follow-up to right shoulder pain in the setting of right rotator cuff tendinopathy, chronic condition with exacerbation ongoing for the past few months.  At his last visit on 09/03/2021 he had demonstrated interval progress though is still symptomatic, was amenable to a home-based rehab program, and naproxen as needed, cyclobenzaprine was discontinued.  Over the interim he states that symptoms have mildly improved, it has been difficult for him to regularly perform home exercises but he has been doing them, finds that he requires naproxen roughly twice daily.  He has not attempted overhead athletics so he states that it is hard to assess full progress. His examination does show pain with overhead ROM and resisted RC testing, no weakness noted, +impingement, tenderness primarily at the lateral subacromial, secondarily to the inferior medial border of the right scapula, palpable trigger point, lastly to the bicipital groove/biceps tendon.  Given his clinical course, findings today, we discussed additional treatment strategies and he did elect to proceed with right shoulder subacromial corticosteroid injection under ultrasound guidance and right medial scapular border trigger point injection.  Post care reviewed and need for continued rehab advised.  He is to contact us at the 2-4-week mark for any suboptimal progress at which stage advanced imaging to be considered, can also consider formal PT, and medication management.  Chronic condition, symptomatic, OTC medications

## 2021-10-01 NOTE — Patient Instructions (Signed)
You have just been given a cortisone injection to reduce pain and inflammation. After the injection you may notice immediate relief of pain as a result of the Lidocaine. It is important to rest the area of the injection for 24 to 48 hours after the injection. There is a possibility of some temporary increased discomfort and swelling for up to 72 hours until the cortisone begins to work. If you do have pain, simply rest the joint and use ice. If you can tolerate over the counter medications, you can try Tylenol, Aleve, or Advil for added relief per package instructions. -As above, relative rest x2 days with gradual return to normal activity -Can continue naproxen on an as-needed basis until symptoms respond to cortisone -Restart home exercises by the weekend, focus on gradual advance and continue x4 to 6 weeks -Contact our office over the next 2-4 weeks for any lingering symptoms or lack of adequate progress -Follow-up as needed

## 2021-10-20 ENCOUNTER — Other Ambulatory Visit: Payer: Self-pay | Admitting: Family Medicine

## 2021-10-20 DIAGNOSIS — M549 Dorsalgia, unspecified: Secondary | ICD-10-CM

## 2021-10-20 NOTE — Telephone Encounter (Signed)
Requested Prescriptions  ?Pending Prescriptions Disp Refills  ?? naproxen (NAPROSYN) 500 MG tablet [Pharmacy Med Name: NAPROXEN 500 MG TABLET] 60 tablet 0  ?  Sig: TAKE 1 TABLET BY MOUTH 2 TIMES DAILY AS NEEDED (PAIN).  ?  ? Analgesics:  NSAIDS Failed - 10/20/2021  1:44 AM  ?  ?  Failed - Manual Review: Labs are only required if the patient has taken medication for more than 8 weeks.  ?  ?  Passed - Cr in normal range and within 360 days  ?  Creat  ?Date Value Ref Range Status  ?08/19/2021 0.77 0.70 - 1.35 mg/dL Final  ?   ?  ?  Passed - HGB in normal range and within 360 days  ?  Hemoglobin  ?Date Value Ref Range Status  ?04/03/2021 15.0 13.0 - 17.0 g/dL Final  ?   ?  ?  Passed - PLT in normal range and within 360 days  ?  Platelets  ?Date Value Ref Range Status  ?04/03/2021 308 150 - 400 K/uL Final  ?   ?  ?  Passed - HCT in normal range and within 360 days  ?  HCT  ?Date Value Ref Range Status  ?04/03/2021 43.0 39.0 - 52.0 % Final  ?   ?  ?  Passed - eGFR is 30 or above and within 360 days  ?  GFR, Est African American  ?Date Value Ref Range Status  ?11/08/2020 107 > OR = 60 mL/min/1.38m Final  ? ?GFR, Est Non African American  ?Date Value Ref Range Status  ?11/08/2020 93 > OR = 60 mL/min/1.77mFinal  ? ?GFR, Estimated  ?Date Value Ref Range Status  ?04/03/2021 >60 >60 mL/min Final  ?  Comment:  ?  (NOTE) ?Calculated using the CKD-EPI Creatinine Equation (2021) ?  ? ?eGFR  ?Date Value Ref Range Status  ?08/19/2021 102 > OR = 60 mL/min/1.7382minal  ?  Comment:  ?  The eGFR is based on the CKD-EPI 2021 equation. To calculate  ?the new eGFR from a previous Creatinine or Cystatin C ?result, go to https://www.kidney.org/professionals/ ?kdoqi/gfr%5Fcalculator ?  ?   ?  ?  Passed - Patient is not pregnant  ?  ?  Passed - Valid encounter within last 12 months  ?  Recent Outpatient Visits   ?      ? 2 weeks ago [facility]endin[facility] rotator cuff, right  [facility]  ? 1 month ago [facility]endin[facility]  of rotator cuff, right  [facility]  ? 2 months ago [facility] rotator cuff, right  [facility]  ? 2 months ago Mid back pain on right side  ? [facility]  ? 5 months ago Elevated hemoglobin A1c  ? [place]  ?  ?  ?Future Appointments   ?        ? In 1 month Karam[place]egos, AleDevonne [facility]O [facility]  ? ?  ?  ?  ? ?

## 2021-11-16 ENCOUNTER — Other Ambulatory Visit: Payer: Self-pay | Admitting: Family Medicine

## 2021-11-16 DIAGNOSIS — Z8673 Personal history of transient ischemic attack (TIA), and cerebral infarction without residual deficits: Secondary | ICD-10-CM

## 2021-11-17 NOTE — Telephone Encounter (Signed)
Requested medication (s) are due for refill today -expired Rx ? ?Requested medication (s) are on the active medication list - yes ? ?Future visit scheduled -yes ? ?Last refill: - 11/15/20 #90 3RF ? ?Notes to clinic: Request RF: fails lab protocol, expired Rx ? ?Requested Prescriptions  ?Pending Prescriptions Disp Refills  ? atorvastatin (LIPITOR) 40 MG tablet [Pharmacy Med Name: ATORVASTATIN 40 MG TABLET] 90 tablet 3  ?  Sig: TAKE 1 TABLET BY MOUTH EVERYDAY AT BEDTIME  ?  ? Cardiovascular:  Antilipid - Statins Failed - 11/16/2021  9:07 AM  ?  ?  Failed - Lipid Panel in normal range within the last 12 months  ?  Cholesterol  ?Date Value Ref Range Status  ?11/08/2020 129 <200 mg/dL Final  ? ?LDL Cholesterol (Calc)  ?Date Value Ref Range Status  ?11/08/2020 63 mg/dL (calc) Final  ?  Comment:  ?  Reference range: <100 ?Marland Kitchen ?Desirable range <100 mg/dL for primary prevention;   ?<70 mg/dL for patients with CHD or diabetic patients  ?with > or = 2 CHD risk factors. ?. ?LDL-C is now calculated using the Martin-Hopkins  ?calculation, which is a validated novel method providing  ?better accuracy than the Friedewald equation in the  ?estimation of LDL-C.  ?Cresenciano Genre et al. Annamaria Helling. 8527;782(42): 2061-2068  ?(http://education.[facility].com/faq/FAQ164) ?  ? ?HDL  ?Date Value Ref Range Status  ?11/08/2020 50 > OR = 40 mg/dL Final  ? ?Triglycerides  ?Date Value Ref Range Status  ?11/08/2020 82 <150 mg/dL Final  ? ?  ?  ?  Passed - Patient is not pregnant  ?  ?  Passed - Valid encounter within last 12 months  ?  Recent Outpatient Visits   ? ?      ? 1 month ago Tendinopathy of rotator cuff, right  ? [facility] Montel Culver, MD  ? 2 months ago Tendinopathy of rotator cuff, right  ? [facility] Montel Culver, MD  ? 2 months ago Tendinopathy of rotator cuff, right  ? [facility] Montel Culver, MD  ? 3 months ago Mid back pain on right side  ? [place], DO  ? 6 months ago Elevated hemoglobin A1c  ? [place], DO  ? ?  ?  ?Future Appointments   ? ?        ? In 3 weeks Parks Ranger Devonne Doughty, DO [facility], [place]  ? ?  ? ?  ?  ?  ? ? ? ?Requested Prescriptions  ?Pending Prescriptions Disp Refills  ? atorvastatin (LIPITOR) 40 MG tablet [Pharmacy Med Name: ATORVASTATIN 40 MG TABLET] 90 tablet 3  ?  Sig: TAKE 1 TABLET BY MOUTH EVERYDAY AT BEDTIME  ?  ? Cardiovascular:  Antilipid - Statins Failed - 11/16/2021  9:07 AM  ?  ?  Failed - Lipid Panel in normal range within the last 12 months  ?  Cholesterol  ?Date Value Ref Range Status  ?11/08/2020 129 <200 mg/dL Final  ? ?LDL Cholesterol (Calc)  ?Date Value Ref Range Status  ?11/08/2020 63 mg/dL (calc) Final  ?  Comment:  ?  Reference range: <100 ?Marland Kitchen ?Desirable range <100 mg/dL for primary prevention;   ?<70 mg/dL for patients with CHD or diabetic patients  ?with > or = 2 CHD risk factors. ?. ?LDL-C is now calculated using the Martin-Hopkins  ?calculation, which is a validated novel method providing  ?  better accuracy than the Friedewald equation in the  ?estimation of LDL-C.  ?Cresenciano Genre et al. Annamaria Helling. 7741;287(86): 2061-2068  ?(http://education.[facility].com/faq/FAQ164) ?  ? ?HDL  ?Date Value Ref Range Status  ?11/08/2020 50 > OR = 40 mg/dL Final  ? ?Triglycerides  ?Date Value Ref Range Status  ?11/08/2020 82 <150 mg/dL Final  ? ?  ?  ?  Passed - Patient is not pregnant  ?  ?  Passed - Valid encounter within last 12 months  ?  Recent Outpatient Visits   ? ?      ? 1 month ago Tendinopathy of rotator cuff, right  ? [facility] Montel Culver, MD  ? 2 months ago Tendinopathy of rotator cuff, right  ? [facility] Montel Culver, MD  ? 2 months ago Tendinopathy of rotator cuff, right  ? [facility] Montel Culver, MD  ? 3 months ago Mid back pain on right side  ? [place], DO  ? 6  months ago Elevated hemoglobin A1c  ? [place], DO  ? ?  ?  ?Future Appointments   ? ?        ? In 3 weeks Parks Ranger, Devonne Doughty, DO [facility] [place]  ? ?  ? ?  ?  ?  ? ? ? ?

## 2021-11-19 ENCOUNTER — Other Ambulatory Visit: Payer: BC Managed Care – PPO

## 2021-11-26 ENCOUNTER — Encounter: Payer: BC Managed Care – PPO | Admitting: Family Medicine

## 2021-12-04 ENCOUNTER — Other Ambulatory Visit: Payer: BC Managed Care – PPO

## 2021-12-04 DIAGNOSIS — E538 Deficiency of other specified B group vitamins: Secondary | ICD-10-CM

## 2021-12-04 DIAGNOSIS — Z0000 Encounter for general adult medical examination without abnormal findings: Secondary | ICD-10-CM

## 2021-12-04 DIAGNOSIS — E559 Vitamin D deficiency, unspecified: Secondary | ICD-10-CM

## 2021-12-04 DIAGNOSIS — Z125 Encounter for screening for malignant neoplasm of prostate: Secondary | ICD-10-CM

## 2021-12-04 DIAGNOSIS — E785 Hyperlipidemia, unspecified: Secondary | ICD-10-CM

## 2021-12-04 DIAGNOSIS — R7309 Other abnormal glucose: Secondary | ICD-10-CM

## 2021-12-05 LAB — CBC WITH DIFFERENTIAL/PLATELET
Absolute Monocytes: 536 cells/uL (ref 200–950)
Basophils Absolute: 47 cells/uL (ref 0–200)
Basophils Relative: 0.7 %
Eosinophils Absolute: 127 cells/uL (ref 15–500)
Eosinophils Relative: 1.9 %
HCT: 41.3 % (ref 38.5–50.0)
Hemoglobin: 13.9 g/dL (ref 13.2–17.1)
Lymphs Abs: 1769 cells/uL (ref 850–3900)
MCH: 32.3 pg (ref 27.0–33.0)
MCHC: 33.7 g/dL (ref 32.0–36.0)
MCV: 95.8 fL (ref 80.0–100.0)
MPV: 9.7 fL (ref 7.5–12.5)
Monocytes Relative: 8 %
Neutro Abs: 4221 cells/uL (ref 1500–7800)
Neutrophils Relative %: 63 %
Platelets: 319 10*3/uL (ref 140–400)
RBC: 4.31 10*6/uL (ref 4.20–5.80)
RDW: 12.2 % (ref 11.0–15.0)
Total Lymphocyte: 26.4 %
WBC: 6.7 10*3/uL (ref 3.8–10.8)

## 2021-12-05 LAB — HEMOGLOBIN A1C
Hgb A1c MFr Bld: 6.2 % of total Hgb — ABNORMAL HIGH (ref <5.7)
Mean Plasma Glucose: 131 mg/dL
eAG (mmol/L): 7.3 mmol/L

## 2021-12-05 LAB — COMPLETE METABOLIC PANEL WITH GFR
AG Ratio: 1.6 (calc) (ref 1.0–2.5)
ALT: 34 U/L (ref 9–46)
AST: 25 U/L (ref 10–35)
Albumin: 4 g/dL (ref 3.6–5.1)
Alkaline phosphatase (APISO): 101 U/L (ref 35–144)
BUN: 17 mg/dL (ref 7–25)
CO2: 27 mmol/L (ref 20–32)
Calcium: 9.1 mg/dL (ref 8.6–10.3)
Chloride: 106 mmol/L (ref 98–110)
Creat: 0.77 mg/dL (ref 0.70–1.35)
Globulin: 2.5 g/dL (calc) (ref 1.9–3.7)
Glucose, Bld: 143 mg/dL — ABNORMAL HIGH (ref 65–99)
Potassium: 4.1 mmol/L (ref 3.5–5.3)
Sodium: 141 mmol/L (ref 135–146)
Total Bilirubin: 0.5 mg/dL (ref 0.2–1.2)
Total Protein: 6.5 g/dL (ref 6.1–8.1)
eGFR: 102 mL/min/{1.73_m2} (ref >=60)

## 2021-12-05 LAB — VITAMIN D 25 HYDROXY (VIT D DEFICIENCY, FRACTURES): Vit D, 25-Hydroxy: 49 ng/mL (ref 30–100)

## 2021-12-05 LAB — LIPID PANEL
Cholesterol: 121 mg/dL (ref <200)
HDL: 54 mg/dL (ref >=40)
LDL Cholesterol (Calc): 49 mg/dL (calc)
Non-HDL Cholesterol (Calc): 67 mg/dL (calc) (ref <130)
Total CHOL/HDL Ratio: 2.2 (calc) (ref <5.0)
Triglycerides: 96 mg/dL (ref <150)

## 2021-12-05 LAB — PSA: PSA: 0.61 ng/mL (ref <=4.00)

## 2021-12-05 LAB — VITAMIN B12: Vitamin B-12: 1733 pg/mL — ABNORMAL HIGH (ref 200–1100)

## 2021-12-11 ENCOUNTER — Ambulatory Visit (INDEPENDENT_AMBULATORY_CARE_PROVIDER_SITE_OTHER): Payer: BC Managed Care – PPO | Admitting: Family Medicine

## 2021-12-11 ENCOUNTER — Other Ambulatory Visit: Payer: Self-pay | Admitting: Family Medicine

## 2021-12-11 ENCOUNTER — Encounter: Payer: Self-pay | Admitting: Family Medicine

## 2021-12-11 VITALS — BP 126/72 | HR 86 | Ht 74.0 in | Wt 268.0 lb

## 2021-12-11 DIAGNOSIS — G8929 Other chronic pain: Secondary | ICD-10-CM

## 2021-12-11 DIAGNOSIS — Z0000 Encounter for general adult medical examination without abnormal findings: Secondary | ICD-10-CM | POA: Diagnosis not present

## 2021-12-11 DIAGNOSIS — E669 Obesity, unspecified: Secondary | ICD-10-CM

## 2021-12-11 DIAGNOSIS — R351 Nocturia: Secondary | ICD-10-CM

## 2021-12-11 DIAGNOSIS — E538 Deficiency of other specified B group vitamins: Secondary | ICD-10-CM | POA: Diagnosis not present

## 2021-12-11 DIAGNOSIS — L719 Rosacea, unspecified: Secondary | ICD-10-CM

## 2021-12-11 DIAGNOSIS — E782 Mixed hyperlipidemia: Secondary | ICD-10-CM

## 2021-12-11 DIAGNOSIS — E559 Vitamin D deficiency, unspecified: Secondary | ICD-10-CM | POA: Diagnosis not present

## 2021-12-11 DIAGNOSIS — R7309 Other abnormal glucose: Secondary | ICD-10-CM

## 2021-12-11 DIAGNOSIS — I6930 Unspecified sequelae of cerebral infarction: Secondary | ICD-10-CM

## 2021-12-11 DIAGNOSIS — M545 Low back pain, unspecified: Secondary | ICD-10-CM

## 2021-12-11 DIAGNOSIS — G4733 Obstructive sleep apnea (adult) (pediatric): Secondary | ICD-10-CM

## 2021-12-11 MED ORDER — DESONIDE 0.05 % EX LOTN: TOPICAL_LOTION | Freq: Two times a day (BID) | CUTANEOUS | 5 refills | Status: AC | PRN

## 2021-12-11 NOTE — Patient Instructions (Addendum)
Thank you for coming to the office today. ? ?Re ordered [place] lotion for rosacea ? ?For the R Flank side pain, most likely muscular, we can consider repeat CT Scan for kidney stone just to confirm, or return to Urologist. ? ?Recommend Chiropractor for the ribs. ? ?START anti inflammatory topical - OTC Voltaren (generic Diclofenac) topical 2-4 times a day as needed for pain swelling of affected joint for 1-2 weeks or longer. ? ?Cologuard next year 2024 ? ?Lab results are excellent ? ?Keep on other current medications. ? ? ?DUE for FASTING BLOOD WORK (no food or drink after midnight before the lab appointment, only water or coffee without cream/sugar on the morning of) ? ?SCHEDULE "Lab Only" visit in the morning at the clinic for lab draw in 1 YEAR ? ?- Make sure Lab Only appointment is at about 1 week before your next appointment, so that results will be available ? ?For Lab Results, once available within 2-3 days of blood draw, you can can log in to MyChart online to view your results and a brief explanation. Also, we can discuss results at next follow-up visit. ? ? ?Please schedule a Follow-up Appointment to: Return in about 1 year (around 12/12/2022) for 1 year fasting lab only then 1 week later Annual Physical. ? ?If you have any other questions or concerns, please feel free to call the office or send a message through [place]. You may also schedule an earlier appointment if necessary. ? ?Additionally, you may be receiving a survey about your experience at our office within a few days to 1 week by e-mail or mail. We value your feedback. ? ?[name], DO ?[place] ?

## 2021-12-11 NOTE — Assessment & Plan Note (Signed)
Well controlled, chronic OSA on CPAP - Good adherence to CPAP nightly - Continue current CPAP therapy, patient seems to be benefiting from therapy  

## 2021-12-11 NOTE — Progress Notes (Signed)
? ?Subjective:  ? ? Patient ID: Randy Watson, male    DOB: November 22, 1960, 61 y.o.   MRN: [id number] ? ?Randy Watson is a 61 y.o. male presenting on 12/11/2021 for Annual Exam ? ? ?HPI ? ?Here for Annual Physical and Lab Review ? ?Lumbar DDD / Chronic Back Pain ?Hip Pain ?Followed by [facility] ?  ?Elevated A1c / Pre-Diabetes ?Prior results 6.3 to 6.4 ?Last result 6.2, also has had in the past better at 5.8 ?Goal to improve sugar / starches carbs ?  ?History of CVA, L cerebellar w/ PICA ?He continues on ASA 81, Lipitor '40mg'$  ?Now followed by Cardiology at [facility]  ?  ?Vitamin D Deficiency ?Vitamin B12 deficiency ?Improved to Lab 49. On Vita D 1000unit daily ?Improved, resolved on B12 supplement 1016mg daily ? ?HYPERLIPIDEMIA: ?- Reports no concerns. Last lipid panel 11/2021 controlled  ? ?CHRONIC HTN: ?[facility] ?Current Meds - Valsartan '160mg'$  daily recently increased this week from 80 to 160   ?Reports good compliance, took meds today. Tolerating well, w/o complaints. ?Denies CP, dyspnea, HA, edema, dizziness / lightheadedness ? ?OSA, on CPAP ?- Patient reports prior history of dx OSA and on CPAP ?- Today reports that sleep apnea is well controlled. He uses the CPAP machine every night. Tolerates the machine well, and thinks that sleeps better with it and feels good. No new concerns or symptoms. ? ? ?Additional history ?  ?PMH history Kidney Stone ?Followed by [facility] Dr BErlene Quan? ?Right Lower Flank / Back Pain ?Still has episodes on back, despite kidney stones being resolved. No urinary symptoms. ?Has not had repeat CT Renal Stone study but did have Renal UKoreathat was negative after passing stone. ?  ? ?Health Maintenance: ? ?Prostate CA Screening: Last PSA 0.61 (11/2021). Currently asymptomatic. No known family history of prostate CA.  ? ?Shingles vaccine eligible, defer for now. ? ?Colon CA Screening: Last Cologuard 10/24/19 (negative) next due in 3 years  or 2024 ? ? ? ? ?  12/11/2021  ?  8:35 AM 10/01/2021  ?  8:12 AM 09/03/2021  ?  8:07 AM  ?Depression screen PHQ 2/9  ?Decreased Interest 0 0 0  ?Down, Depressed, Hopeless 0 0 0  ?PHQ - 2 Score 0 0 0  ?Altered sleeping 0 0 0  ?Tired, decreased energy 0 0 0  ?Change in appetite 0 0 0  ?Feeling bad or failure about yourself  0 0 0  ?Trouble concentrating 0 0 0  ?Moving slowly or fidgety/restless 0 0 0  ?Suicidal thoughts 0 0 0  ?PHQ-9 Score 0 0 0  ?Difficult doing work/chores Not difficult at all Not difficult at all Not difficult at all  ? ? ?Past Medical History:  ?Diagnosis Date  ? Allergy   ? Anal fissure   ? Constipation   ? GERD (gastroesophageal reflux disease)   ? History of chicken pox   ? Obesity   ? Stroke ([facility] 12/2018  ? ?Past Surgical History:  ?Procedure Laterality Date  ? TEE WITHOUT CARDIOVERSION N/A 05/31/2019  ? Procedure: TRANSESOPHAGEAL ECHOCARDIOGRAM (TEE);  Surgeon: KCorey Skains MD;  Location: [facility] ORS;  Service: Cardiovascular;  Laterality: N/A;  ? ?Social History  ? ?Socioeconomic History  ? Marital status: Married  ?  Spouse name: PKarthikeya Funke ? Number of children: Not on file  ? Years of education: Not on file  ? Highest education level: Not on file  ?Occupational History  ?  Not on file  ?Tobacco Use  ? Smoking status: Never  ? Smokeless tobacco: Former  ?  Types: Chew  ?  Quit date: 01/01/2019  ? Tobacco comments:  ?  dip  ?Vaping Use  ? Vaping Use: Never used  ?Substance and Sexual Activity  ? Alcohol use: Yes  ?  Alcohol/week: 1.0 - 4.0 standard drink  ?  Types: 1 - 4 Standard drinks or equivalent per week  ?  Comment: Only on the weekends  ? Drug use: Never  ? Sexual activity: Yes  ?  Partners: Female  ?Other Topics Concern  ? Not on file  ?Social History Narrative  ? Not on file  ? ?Social Determinants of Health  ? ?Financial Resource Strain: Not on file  ?Food Insecurity: Not on file  ?Transportation Needs: Not on file  ?Physical Activity: Not on file  ?Stress: Not on file   ?Social Connections: Not on file  ?Intimate Partner Violence: Not on file  ? ?Family History  ?Problem Relation Age of Onset  ? Cancer Father   ?     renal/pancreatic  ? CAD Father   ? Diabetes Father   ? Prostate cancer Maternal Grandfather   ? Bladder Cancer Neg Hx   ? ?Current Outpatient Medications on File Prior to Visit  ?Medication Sig  ? acetaminophen (TYLENOL) 500 MG tablet Take 1,000 mg by mouth every 6 (six) hours as needed for moderate pain or headache.  ? Ascorbic Acid (VITAMIN C PO) Take 1 tablet by mouth daily.  ? aspirin 81 MG EC tablet Take 81 mg by mouth daily.  ? atorvastatin (LIPITOR) 40 MG tablet TAKE 1 TABLET BY MOUTH EVERYDAY AT BEDTIME  ? cholecalciferol (VITAMIN D3) 25 MCG (1000 UT) tablet Take 1,000 Units by mouth at bedtime.  ? cyclobenzaprine (FLEXERIL) 10 MG tablet Take 0.5-1 tablets (5-10 mg total) by mouth 3 (three) times daily as needed for muscle spasms (ureteral spasm).  ? Multiple Vitamins-Minerals (ZINC PO) Take 1 tablet by mouth daily.  ? naproxen (NAPROSYN) 500 MG tablet TAKE 1 TABLET BY MOUTH 2 TIMES DAILY AS NEEDED (PAIN).  ? omeprazole (PRILOSEC OTC) 20 MG tablet Take 20-40 mg by mouth daily as needed (acid reflux).  ? valsartan (DIOVAN) 160 MG tablet Take 160 mg by mouth daily.  ? vitamin B-12 (CYANOCOBALAMIN) 1000 MCG tablet Take 1,000 mcg by mouth at bedtime.  ? [DISCONTINUED] triamcinolone (NASACORT ALLERGY 24HR) 55 MCG/ACT AERO nasal inhaler Place 1 spray into the nose daily as needed (allergies).  ? ?No current facility-administered medications on file prior to visit.  ? ? ?Review of Systems  ?Constitutional:  Negative for activity change, appetite change, chills, diaphoresis, fatigue and fever.  ?HENT:  Negative for congestion and hearing loss.   ?Eyes:  Negative for visual disturbance.  ?Respiratory:  Negative for cough, chest tightness, shortness of breath and wheezing.   ?Cardiovascular:  Negative for chest pain, palpitations and leg swelling.  ?Gastrointestinal:   Negative for abdominal pain, constipation, diarrhea, nausea and vomiting.  ?Genitourinary:  Negative for dysuria, frequency and hematuria.  ?Musculoskeletal:  Positive for back pain. Negative for arthralgias and neck pain.  ?Skin:  Negative for rash.  ?Neurological:  Negative for dizziness, weakness, light-headedness, numbness and headaches.  ?Hematological:  Negative for adenopathy.  ?Psychiatric/Behavioral:  Negative for behavioral problems, dysphoric mood and sleep disturbance.   ?Per HPI unless specifically indicated above ? ? ?   ?Objective:  ?  ?BP 126/72   Pulse 86  Ht '6\' 2"'$  (1.88 m)   Wt 268 lb (121.6 kg)   SpO2 96%   BMI 34.41 kg/m?   ?Wt Readings from Last 3 Encounters:  ?12/11/21 268 lb (121.6 kg)  ?10/01/21 280 lb (127 kg)  ?09/03/21 276 lb (125.2 kg)  ?  ?Physical Exam ?Vitals and nursing note reviewed.  ?Constitutional:   ?   General: He is not in acute distress. ?   Appearance: He is well-developed. He is not diaphoretic.  ?   Comments: Well-appearing, comfortable, cooperative  ?HENT:  ?   Head: Normocephalic and atraumatic.  ?Eyes:  ?   General:     ?   Right eye: No discharge.     ?   Left eye: No discharge.  ?   Conjunctiva/sclera: Conjunctivae normal.  ?   Pupils: Pupils are equal, round, and reactive to light.  ?Neck:  ?   Thyroid: No thyromegaly.  ?Cardiovascular:  ?   Rate and Rhythm: Normal rate and regular rhythm.  ?   Pulses: Normal pulses.  ?   Heart sounds: Normal heart sounds. No murmur heard. ?Pulmonary:  ?   Effort: Pulmonary effort is normal. No respiratory distress.  ?   Breath sounds: Normal breath sounds. No wheezing or rales.  ?Abdominal:  ?   General: Bowel sounds are normal. There is no distension.  ?   Palpations: Abdomen is soft. There is no mass.  ?   Tenderness: There is no abdominal tenderness.  ?Musculoskeletal:     ?   General: No tenderness. Normal range of motion.  ?   Cervical back: Normal range of motion and neck supple.  ?   Comments: Upper / Lower  Extremities: ?- Normal muscle tone, strength bilateral upper extremities 5/5, lower extremities 5/5  ?Lymphadenopathy:  ?   Cervical: No cervical adenopathy.  ?Skin: ?   General: Skin is warm and dry.  ?   Findings: No erythe

## 2022-01-29 ENCOUNTER — Ambulatory Visit: Payer: BC Managed Care – PPO | Admitting: Internal Medicine

## 2022-01-29 ENCOUNTER — Encounter: Payer: Self-pay | Admitting: Internal Medicine

## 2022-01-29 ENCOUNTER — Ambulatory Visit
Admission: RE | Admit: 2022-01-29 | Discharge: 2022-01-29 | Disposition: A | Discharge status: Home / Self Care | Payer: BC Managed Care – PPO | Source: Ambulatory Visit | Attending: Internal Medicine | Admitting: Internal Medicine

## 2022-01-29 VITALS — BP 142/84 | HR 81 | Temp 96.9°F | Wt 276.0 lb

## 2022-01-29 DIAGNOSIS — R361 Hematospermia: Secondary | ICD-10-CM | POA: Diagnosis present

## 2022-01-29 DIAGNOSIS — N50811 Right testicular pain: Secondary | ICD-10-CM

## 2022-01-29 DIAGNOSIS — N50812 Left testicular pain: Secondary | ICD-10-CM | POA: Diagnosis not present

## 2022-01-29 LAB — POCT URINALYSIS DIPSTICK
Bilirubin, UA: NEGATIVE
Blood, UA: NEGATIVE
Glucose, UA: NEGATIVE
Ketones, UA: NEGATIVE
Leukocytes, UA: NEGATIVE
Nitrite, UA: NEGATIVE
Protein, UA: NEGATIVE
Spec Grav, UA: 1.01 (ref 1.010–1.025)
Urobilinogen, UA: 0.2 E.U./dL
pH, UA: 5 (ref 5.0–8.0)

## 2022-01-29 NOTE — Addendum Note (Signed)
Addended by: Ashley Royalty E on: 01/29/2022 01:56 PM   Modules accepted: Orders

## 2022-01-29 NOTE — Progress Notes (Signed)
Subjective:    Patient ID: Randy Watson, male    DOB: [birth year], 61 y.o.   MRN: [id number]  HPI  Patient presents to clinic today with complaint of testicular pain. He reports this started 2 days ago. He describes the pain as pressure. He reports urinary frequency, but denies urgency, dysuria, blood in his urine.He denies testicular swelling, mass or lesion. His main concern is that he had blood in his semen 2 days ago. This has never happened before. He denies any recent testicular trauma. He has had a history of kidney stones but reports this feels different.  Review of Systems     Past Medical History:  Diagnosis Date   Allergy    Anal fissure    Constipation    GERD (gastroesophageal reflux disease)    History of chicken pox    Obesity    Stroke ([place]) 12/2018    Current Outpatient Medications  Medication Sig Dispense Refill   acetaminophen (TYLENOL) 500 MG tablet Take 1,000 mg by mouth every 6 (six) hours as needed for moderate pain or headache.     Ascorbic Acid (VITAMIN C PO) Take 1 tablet by mouth daily.     aspirin 81 MG EC tablet Take 81 mg by mouth daily.     atorvastatin (LIPITOR) 40 MG tablet TAKE 1 TABLET BY MOUTH EVERYDAY AT BEDTIME 90 tablet 3   cholecalciferol (VITAMIN D3) 25 MCG (1000 UT) tablet Take 1,000 Units by mouth at bedtime.     cyclobenzaprine (FLEXERIL) 10 MG tablet Take 0.5-1 tablets (5-10 mg total) by mouth 3 (three) times daily as needed for muscle spasms (ureteral spasm). 30 tablet 2   desonide (DESOWEN) 0.05 % lotion Apply topically 2 (two) times daily as needed (rosacea). Use for up to 3-5 days then stop, may repeat 59 mL 5   Multiple Vitamins-Minerals (ZINC PO) Take 1 tablet by mouth daily.     naproxen (NAPROSYN) 500 MG tablet TAKE 1 TABLET BY MOUTH 2 TIMES DAILY AS NEEDED (PAIN). 60 tablet 0   omeprazole (PRILOSEC OTC) 20 MG tablet Take 20-40 mg by mouth daily as needed (acid reflux).     valsartan (DIOVAN) 160 MG tablet Take 160 mg by  mouth daily.     vitamin B-12 (CYANOCOBALAMIN) 1000 MCG tablet Take 1,000 mcg by mouth at bedtime.     No current facility-administered medications for this visit.    Allergies  Allergen Reactions   Penicillins     Did it involve swelling of the face/tongue/throat, SOB, or low BP? Unknown Did it involve sudden or severe rash/hives, skin peeling, or any reaction on the inside of your mouth or nose? Unknown Did you need to seek medical attention at a hospital or doctor's office? Unknown When did it last happen?      childhood allergy If all above answers are "NO", may proceed with cephalosporin use.     Family History  Problem Relation Age of Onset   Cancer Father        renal/pancreatic   CAD Father    Diabetes Father    Prostate cancer Maternal Grandfather    Bladder Cancer Neg Hx     Social History   Socioeconomic History   Marital status: Married    Spouse name: Shawna Kiener   Number of children: Not on file   Years of education: Not on file   Highest education level: Not on file  Occupational History   Not on file  Tobacco Use  Smoking status: Never   Smokeless tobacco: Former    Types: Chew    Quit date: 01/01/2019   Tobacco comments:    dip  Vaping Use   Vaping Use: Never used  Substance and Sexual Activity   Alcohol use: Yes    Alcohol/week: 1.0 - 4.0 standard drink of alcohol    Types: 1 - 4 Standard drinks or equivalent per week    Comment: Only on the weekends   Drug use: Never   Sexual activity: Yes    Partners: Female  Other Topics Concern   Not on file  Social History Narrative   Not on file   Social Determinants of Health   Financial Resource Strain: Not on file  Food Insecurity: Not on file  Transportation Needs: Not on file  Physical Activity: Not on file  Stress: Not on file  Social Connections: Not on file  Intimate Partner Violence: Not on file     Constitutional: Denies fever, malaise, fatigue, headache or abrupt weight  changes.  Respiratory: Denies difficulty breathing, shortness of breath, cough or sputum production.   Cardiovascular: Denies chest pain, chest tightness, palpitations or swelling in the hands or feet.  Gastrointestinal: Denies abdominal pain, bloating, constipation, diarrhea or blood in the stool.  GU: Pt reports urinary frequency, blood in semen. Denies urgency, pain with urination, burning sensation, blood in urine, odor or discharge. Musculoskeletal: Denies decrease in range of motion, difficulty with gait, muscle pain or joint pain and swelling.  Skin: Denies redness, rashes, lesions or ulcercations.   No other specific complaints in a complete review of systems (except as listed in HPI above).  Objective:   Physical Exam BP (!) 142/84 (BP Location: Left Arm, Patient Position: Sitting, Cuff Size: Large)   Pulse 81   Temp (!) 96.9 F (36.1 C) (Temporal)   Wt 276 lb (125.2 kg)   SpO2 99%   BMI 35.44 kg/m   Wt Readings from Last 3 Encounters:  12/11/21 268 lb (121.6 kg)  10/01/21 280 lb (127 kg)  09/03/21 276 lb (125.2 kg)    General: Appears his stated age, obese, in NAD. Skin: Warm, dry and intact. No rashes, lesions or ulcerations noted. Cardiovascular: Normal rate and rhythm. S1,S2 noted.  No murmur, rubs or gallops noted.  Pulmonary/Chest: Normal effort and positive vesicular breath sounds. No respiratory distress. No wheezes, rales or ronchi noted.  Abdomen: Soft and mildly tender in bilateral lower quadrants. Normal bowel sounds.  GU: Normal male anatomy. No mass, swelling or lesions noted. No pain with palpation of the scrotum. Rectal: Normal rectal tone. Prostate not enlarged. No nodules noted. Musculoskeletal: Normal range of motion.   Neurological: Alert and oriented.   BMET    Component Value Date/Time   NA 141 12/04/2021 0756   K 4.1 12/04/2021 0756   CL 106 12/04/2021 0756   CO2 27 12/04/2021 0756   GLUCOSE 143 (H) 12/04/2021 0756   BUN 17 12/04/2021 0756    CREATININE 0.77 12/04/2021 0756   CALCIUM 9.1 12/04/2021 0756   GFRNONAA >60 04/03/2021 2120   GFRNONAA 93 11/08/2020 0818   GFRAA 107 11/08/2020 0818    Lipid Panel     Component Value Date/Time   CHOL 121 12/04/2021 0756   TRIG 96 12/04/2021 0756   HDL 54 12/04/2021 0756   CHOLHDL 2.2 12/04/2021 0756   VLDL 10 01/02/2019 0938   LDLCALC 49 12/04/2021 0756    CBC    Component Value Date/Time   WBC  6.7 12/04/2021 0756   RBC 4.31 12/04/2021 0756   HGB 13.9 12/04/2021 0756   HCT 41.3 12/04/2021 0756   PLT 319 12/04/2021 0756   MCV 95.8 12/04/2021 0756   MCH 32.3 12/04/2021 0756   MCHC 33.7 12/04/2021 0756   RDW 12.2 12/04/2021 0756   LYMPHSABS 1,769 12/04/2021 0756   EOSABS 127 12/04/2021 0756   BASOSABS 47 12/04/2021 0756    Hgb A1C Lab Results  Component Value Date   HGBA1C 6.2 (H) 12/04/2021            Assessment & Plan:   Testicular Pain, Blood in Semen:  Urinalysis normal Will obtain US scrotum with doppler No indication for abx at this time- no evidence of UTI or prostate infection  Will follow up after imaging with further recommendation and treatment plan Webb Silversmith, NP

## 2022-03-10 ENCOUNTER — Ambulatory Visit
Admission: RE | Admit: 2022-03-10 | Discharge: 2022-03-10 | Disposition: A | Discharge status: Home / Self Care | Payer: BC Managed Care – PPO | Attending: Family Medicine | Admitting: Family Medicine

## 2022-03-10 ENCOUNTER — Encounter: Payer: Self-pay | Admitting: Family Medicine

## 2022-03-10 ENCOUNTER — Ambulatory Visit: Payer: BC Managed Care – PPO | Admitting: Family Medicine

## 2022-03-10 ENCOUNTER — Ambulatory Visit
Admission: RE | Admit: 2022-03-10 | Discharge: 2022-03-10 | Disposition: A | Discharge status: Home / Self Care | Payer: BC Managed Care – PPO | Source: Ambulatory Visit | Attending: Family Medicine | Admitting: Family Medicine

## 2022-03-10 VITALS — BP 134/84 | HR 92 | Ht 74.0 in | Wt 278.4 lb

## 2022-03-10 DIAGNOSIS — G8929 Other chronic pain: Secondary | ICD-10-CM

## 2022-03-10 DIAGNOSIS — G2589 Other specified extrapyramidal and movement disorders: Secondary | ICD-10-CM

## 2022-03-10 DIAGNOSIS — M79644 Pain in right finger(s): Secondary | ICD-10-CM | POA: Diagnosis not present

## 2022-03-10 DIAGNOSIS — M67911 Unspecified disorder of synovium and tendon, right shoulder: Secondary | ICD-10-CM | POA: Diagnosis not present

## 2022-03-10 DIAGNOSIS — M1811 Unilateral primary osteoarthritis of first carpometacarpal joint, right hand: Secondary | ICD-10-CM | POA: Insufficient documentation

## 2022-03-10 NOTE — Assessment & Plan Note (Signed)
Chronic issue in the setting of comorbid acute on chronic shoulder pain, previously trialed trigger point injection with 0 symptom response, given his clinical course, cervical etiology can be considered.  He does have diffuse vague tenderness throughout the trapezius, paraspinal cervical musculature and rhomboids, can also continue to represent secondary/compensatory muscular pain due to underlying primary shoulder etiology.  Plan for dedicated cervical spine and shoulder x-rays, once symptoms adequately controlled with intervention to be discussed at return, he would greatly benefit from rehab.

## 2022-03-10 NOTE — Progress Notes (Signed)
     Primary Care / Sports Medicine Office Visit  Patient Information:  Patient ID: MANJINDER BREAU, male DOB: 1960-10-26 Age: 61 y.o. MRN: [id number]   BRYNDEN THUNE is a pleasant 61 y.o. male presenting with the following:  Chief Complaint  Patient presents with   Shoulder Pain    Right shoulder, right shoulder blade, down arm into side and into chest.    Hand Pain    Right hand pain for a "good while" got worst    Vitals:   03/10/22 1432  BP: 134/84  Pulse: 92  SpO2: 97%   Vitals:   03/10/22 1432  Weight: 278 lb 6.4 oz (126.3 kg)  Height: '6\' 2"'$  (1.88 m)   Body mass index is 35.74 kg/m.  No results found.   Independent interpretation of notes and tests performed by another provider:   None  Procedures performed:   None  Pertinent History, Exam, Impression, and Recommendations:   Problem List Items Addressed This Visit       Musculoskeletal and Integument   Tendinopathy of rotator cuff, right - Primary    Acute on chronic issue, exam shows primary involvement at the supraspinatus where there is pain and subtle weakness, positive impingement.  He did have relatively good response from previous subacromial corticosteroid injection. Given chronicity of symptoms plan for dedicated x-rays.      Relevant Orders   DG Shoulder Right   DG Cervical Spine Complete     Other   Scapular dyskinesis    Chronic issue in the setting of comorbid acute on chronic shoulder pain, previously trialed trigger point injection with 0 symptom response, given his clinical course, cervical etiology can be considered.  He does have diffuse vague tenderness throughout the trapezius, paraspinal cervical musculature and rhomboids, can also continue to represent secondary/compensatory muscular pain due to underlying primary shoulder etiology.  Plan for dedicated cervical spine and shoulder x-rays, once symptoms adequately controlled with intervention to be discussed at return, he would  greatly benefit from rehab.      Relevant Orders   DG Shoulder Right   DG Cervical Spine Complete   Chronic pain of right thumb    Chronic issue with bilateral involvement, right much greater than left in the setting of right-hand-dominance.  Exam shows focal tenderness at the first [facility], given chronicity and findings, concern for underlying osteoarthritis, plan for dedicated x-rays to further evaluate this and close follow-up to pursue treatment options.      Relevant Orders   DG Hand Complete Right     Orders & Medications No orders of the defined types were placed in this encounter.  Orders Placed This Encounter  Procedures   DG Shoulder Right   DG Hand Complete Right   DG Cervical Spine Complete     Return in about 1 day (around 03/11/2022).     Montel Culver, MD   Primary Care Sports Medicine [place]

## 2022-03-10 NOTE — Assessment & Plan Note (Signed)
Acute on chronic issue, exam shows primary involvement at the supraspinatus where there is pain and subtle weakness, positive impingement.  He did have relatively good response from previous subacromial corticosteroid injection. Given chronicity of symptoms plan for dedicated x-rays.

## 2022-03-10 NOTE — Patient Instructions (Signed)
-   Obtain x-rays - Trial cyclobenzaprine tonight - Return tomorrow

## 2022-03-10 NOTE — Assessment & Plan Note (Signed)
Chronic issue with bilateral involvement, right much greater than left in the setting of right-hand-dominance.  Exam shows focal tenderness at the first [facility], given chronicity and findings, concern for underlying osteoarthritis, plan for dedicated x-rays to further evaluate this and close follow-up to pursue treatment options.

## 2022-03-11 ENCOUNTER — Ambulatory Visit (INDEPENDENT_AMBULATORY_CARE_PROVIDER_SITE_OTHER): Payer: BC Managed Care – PPO | Admitting: Family Medicine

## 2022-03-11 ENCOUNTER — Inpatient Hospital Stay: Payer: Self-pay | Admitting: Radiology

## 2022-03-11 ENCOUNTER — Encounter: Payer: Self-pay | Admitting: Family Medicine

## 2022-03-11 VITALS — BP 132/80 | HR 88 | Ht 74.0 in | Wt 278.0 lb

## 2022-03-11 DIAGNOSIS — M1811 Unilateral primary osteoarthritis of first carpometacarpal joint, right hand: Secondary | ICD-10-CM | POA: Diagnosis not present

## 2022-03-11 DIAGNOSIS — M47812 Spondylosis without myelopathy or radiculopathy, cervical region: Secondary | ICD-10-CM

## 2022-03-11 DIAGNOSIS — M19011 Primary osteoarthritis, right shoulder: Secondary | ICD-10-CM

## 2022-03-11 DIAGNOSIS — M67911 Unspecified disorder of synovium and tendon, right shoulder: Secondary | ICD-10-CM | POA: Diagnosis not present

## 2022-03-11 MED ORDER — TRIAMCINOLONE ACETONIDE 40 MG/ML IJ SUSP
100.0000 mg | Freq: Once | INTRAMUSCULAR | Status: AC
  Administered 2022-03-11: 100 mg via INTRAMUSCULAR

## 2022-03-11 MED ORDER — GABAPENTIN 300 MG PO CAPS
300.0000 mg | ORAL_CAPSULE | Freq: Every evening | ORAL | 0 refills | Status: DC | PRN
Start: 2022-03-11 — End: 2022-04-16

## 2022-03-11 NOTE — Patient Instructions (Signed)
You have just been given a cortisone injection to reduce pain and inflammation. After the injection you may notice immediate relief of pain as a result of the Lidocaine. It is important to rest the area of the injection for 24 to 48 hours after the injection. There is a possibility of some temporary increased discomfort and swelling for up to 72 hours until the cortisone begins to work. If you do have pain, simply rest the joint and use ice. If you can tolerate over the counter medications, you can try Tylenol for added relief per package instructions. - Relative rest x2 days and regular return to normal activity - Contact her office in 2 weeks to provide a status update via [place] for any questions between now and then

## 2022-03-11 NOTE — Addendum Note (Signed)
Addended by: Delia Heady on: 03/11/2022 02:52 PM   Modules accepted: Orders

## 2022-03-11 NOTE — Assessment & Plan Note (Signed)
Reviewed x-rays with patient, given clinical and radiographic features, relative comorbid medical contraindications to both oral steroids and NSAIDs, after a discussion of treatments he did elect to proceed with ultrasound guided subacromial corticosteroid injection today of the right shoulder.  Post care reviewed.  He is to contact us in 2 weeks to provide a status update in regards to his concomitant ipsilateral shoulder blade pain.

## 2022-03-11 NOTE — Progress Notes (Signed)
Primary Care / Sports Medicine Office Visit  Patient Information:  Patient ID: Randy Watson, male DOB: 1960/09/20 Age: 61 y.o. MRN: [id number]   Randy Watson is a pleasant 61 y.o. male presenting with the following:  Chief Complaint  Patient presents with   Procedure    Shoulder and thumb injection     Vitals:   03/11/22 0846  BP: 132/80  Pulse: 88  SpO2: 98%   Vitals:   03/11/22 0846  Weight: 278 lb (126.1 kg)  Height: '6\' 2"'$  (1.88 m)   Body mass index is 35.69 kg/m.  DG Cervical Spine Complete  Result Date: 03/10/2022 CLINICAL DATA:  Neck and shoulder pain for several weeks, no known injury, initial encounter EXAM: CERVICAL SPINE - COMPLETE 4+ VIEW COMPARISON:  None Available. FINDINGS: Seven cervical segments are well visualized. Vertebral body height is well maintained. Multilevel osteophytic changes are seen. Mild neural foraminal narrowing is noted at C3-4 and C4-5 bilaterally. Multilevel facet hypertrophic changes are seen. The odontoid is within normal limits. No soft tissue abnormality is seen. IMPRESSION: Degenerative changes without acute abnormality. Electronically Signed   By: Inez Catalina M.D.   On: 03/10/2022 23:19   DG Hand Complete Right  Result Date: 03/10/2022 CLINICAL DATA:  Right hand first CMC joint pain, initial encounter EXAM: RIGHT HAND - COMPLETE 3+ VIEW COMPARISON:  None Available. FINDINGS: Degenerative changes of the first [facility] joint are noted. No acute fracture or dislocation is seen. No soft tissue abnormality is noted. IMPRESSION: Degenerative changes at the first [facility] joint. No acute abnormality noted. Electronically Signed   By: Inez Catalina M.D.   On: 03/10/2022 23:18   DG Shoulder Right  Result Date: 03/10/2022 CLINICAL DATA:  Acute on chronic right shoulder pain, no known injury, initial encounter EXAM: RIGHT SHOULDER - 2+ VIEW COMPARISON:  None Available. FINDINGS: Degenerative changes of the glenohumeral joint are noted. Some mild  remodeling of the humeral head is seen. No acute fracture or dislocation is noted. No soft tissue abnormality is seen. IMPRESSION: Degenerative change without acute abnormality. Electronically Signed   By: Inez Catalina M.D.   On: 03/10/2022 23:17     Independent interpretation of notes and tests performed by another provider:   None  Procedures performed:   Procedure:  Injection of right subacromial shoulder under ultrasound guidance. Ultrasound guidance utilized for in-plane approach to the right subacromial shoulder, echogenicity of the visualized supraspinatus tendon consistent with chronic tendinopathy Samsung HS60 device utilized with permanent recording / reporting. Verbal informed consent obtained and verified. Skin prepped in a sterile fashion. Ethyl chloride for topical local analgesia.  Completed without difficulty and tolerated well. Medication: triamcinolone acetonide 40 mg/mL suspension for injection 1 mL total and 2 mL lidocaine 1% without epinephrine utilized for needle placement anesthetic Advised to contact for fevers/chills, erythema, induration, drainage, or persistent bleeding.  Procedure:  Injection of right glenohumeral joint under ultrasound guidance. Ultrasound guidance utilized for out of plane approach to the glenohumeral joint, dynamic joint motion visualized and utilized for confirmation, no effusion noted Samsung HS60 device utilized with permanent recording / reporting. Verbal informed consent obtained and verified. Skin prepped in a sterile fashion. Ethyl chloride for topical local analgesia.  Completed without difficulty and tolerated well. Medication: triamcinolone acetonide 40 mg/mL suspension for injection 1 mL total and 2 mL lidocaine 1% without epinephrine utilized for needle placement anesthetic Advised to contact for fevers/chills, erythema, induration, drainage, or persistent bleeding.  Procedure:  Injection of  right first [facility] joint under ultrasound  guidance. Ultrasound guidance utilized for out of plane approach to the right first [facility] joint, dynamic CMC joint mobilization clearly visualized, cortical roughening consistent with osteophytosis noted Samsung HS60 device utilized with permanent recording / reporting. Verbal informed consent obtained and verified. Skin prepped in a sterile fashion. Ethyl chloride for topical local analgesia.  Completed without difficulty and tolerated well. Medication: triamcinolone acetonide 40 mg/mL suspension for injection 0.5 mL total and 0.5 mL lidocaine 1% without epinephrine utilized for needle placement anesthetic Advised to contact for fevers/chills, erythema, induration, drainage, or persistent bleeding.   Pertinent History, Exam, Impression, and Recommendations:   Problem List Items Addressed This Visit       Musculoskeletal and Integument   Tendinopathy of rotator cuff, right - Primary    Reviewed x-rays with patient, given clinical and radiographic features, relative comorbid medical contraindications to both oral steroids and NSAIDs, after a discussion of treatments he did elect to proceed with ultrasound guided subacromial corticosteroid injection today of the right shoulder.  Post care reviewed.  He is to contact us in 2 weeks to provide a status update in regards to his concomitant ipsilateral shoulder blade pain.      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      Relevant Orders   [place] LIMITED JOINT SPACE STRUCTURES UP RIGHT   Arthritis of right glenohumeral joint    Newly noted on recent x-rays, generally considered a sequela of chronic rotator cuff dysfunction  and may account for suboptimal response from prior isolated subacromial cortisone injection.  We reviewed the same with the patient today and he did elect to proceed with both subacromial corticosteroid injection as well as right intra-articular glenohumeral joint injection.  He is to report back in 2 weeks be a status update through MyChart on symptoms/symptom change.      Relevant Orders   [place] LIMITED JOINT SPACE STRUCTURES UP RIGHT   Cervical spondylosis    Recently obtained x-rays show extent of multilevel degenerative changes about the cervical spine, these can definitely account for reported scapular symptoms and may be indicative of radiculopathy.  That being said he does have comorbid newly noted right glenohumeral osteoarthritis as well as well-known rotator cuff tendinopathy.  We will be directly treating the subacromial and intra-articular shoulder with guided injections today.  He is to contact us in 2 weeks to provide a status update regarding shoulder blade pain, if limited response noted, for shoulder blade pain can be considered a sequela of the cervical spondylosis and he would benefit from a trial of nightly gabapentin.  This was sent to his pharmacy and reviewed with patient.      Relevant Medications   gabapentin (NEURONTIN) 300 MG capsule     Orders & Medications Meds ordered this encounter  Medications   gabapentin (NEURONTIN) 300 MG capsule    Sig: Take 1 capsule (300 mg total) by mouth at bedtime as needed.    Dispense:  30 capsule    Refill:  0   Orders Placed This Encounter  Procedures   [place] LIMITED JOINT SPACE STRUCTURES UP RIGHT     No follow-ups on file.     Corene Cornea  [place], MD   [place]

## 2022-03-11 NOTE — Assessment & Plan Note (Signed)
Newly noted on recent x-rays, generally considered a sequela of chronic rotator cuff dysfunction and may account for suboptimal response from prior isolated subacromial cortisone injection.  We reviewed the same with the patient today and he did elect to proceed with both subacromial corticosteroid injection as well as right intra-articular glenohumeral joint injection.  He is to report back in 2 weeks be a status update through MyChart on symptoms/symptom change.

## 2022-03-11 NOTE — Assessment & Plan Note (Signed)
Concern for right first [place] osteoarthritis confirmed with recent x-rays, no contraindications radiographically to local treatments, as such she did elect to proceed with ultrasound-guided intra-articular right first digit CMC joint injection.

## 2022-03-11 NOTE — Assessment & Plan Note (Signed)
Recently obtained x-rays show extent of multilevel degenerative changes about the cervical spine, these can definitely account for reported scapular symptoms and may be indicative of radiculopathy.  That being said he does have comorbid newly noted right glenohumeral osteoarthritis as well as well-known rotator cuff tendinopathy.  We will be directly treating the subacromial and intra-articular shoulder with guided injections today.  He is to contact us in 2 weeks to provide a status update regarding shoulder blade pain, if limited response noted, for shoulder blade pain can be considered a sequela of the cervical spondylosis and he would benefit from a trial of nightly gabapentin.  This was sent to his pharmacy and reviewed with patient.

## 2022-03-17 ENCOUNTER — Encounter: Payer: Self-pay | Admitting: Family Medicine

## 2022-03-17 NOTE — Telephone Encounter (Signed)
Please advise 

## 2022-04-16 ENCOUNTER — Other Ambulatory Visit: Payer: Self-pay

## 2022-04-16 DIAGNOSIS — M47812 Spondylosis without myelopathy or radiculopathy, cervical region: Secondary | ICD-10-CM

## 2022-04-16 MED ORDER — GABAPENTIN 300 MG PO CAPS: 300.0000 mg | ORAL_CAPSULE | Freq: Every evening | ORAL | 0 refills | Status: DC | PRN

## 2022-05-26 ENCOUNTER — Ambulatory Visit: Payer: BC Managed Care – PPO | Admitting: Family Medicine

## 2022-05-26 ENCOUNTER — Encounter: Payer: Self-pay | Admitting: Family Medicine

## 2022-05-26 VITALS — BP 122/84 | HR 68 | Ht 74.0 in | Wt 277.0 lb

## 2022-05-26 DIAGNOSIS — M25561 Pain in right knee: Secondary | ICD-10-CM | POA: Diagnosis not present

## 2022-05-26 DIAGNOSIS — M67911 Unspecified disorder of synovium and tendon, right shoulder: Secondary | ICD-10-CM

## 2022-05-26 DIAGNOSIS — M47812 Spondylosis without myelopathy or radiculopathy, cervical region: Secondary | ICD-10-CM

## 2022-05-26 DIAGNOSIS — M1811 Unilateral primary osteoarthritis of first carpometacarpal joint, right hand: Secondary | ICD-10-CM

## 2022-05-26 DIAGNOSIS — M19011 Primary osteoarthritis, right shoulder: Secondary | ICD-10-CM

## 2022-05-26 MED ORDER — GABAPENTIN 300 MG PO CAPS: 300.0000 mg | ORAL_CAPSULE | Freq: Every evening | ORAL | 1 refills | Status: DC | PRN

## 2022-05-26 NOTE — Assessment & Plan Note (Signed)
Patient presents for follow-up to chronic right thumb pain in the setting of for [facility] osteoarthritis.  Of note he received intra-articular cortisone injection on 03/11/2022, has noted excellent response with only recent subtle worsening.  He had been placed on gabapentin for additional comorbidities and had noted improvement in pain at that time while on gabapentin.  This may reflect a portion of his pain stemming from cervical radiculopathy/neuropathic pain in general.  His examination shows trace/mild tenderness only with deep palpation about the first [place], negative grind test.  Given the above, plan to restart and titrate gabapentin at weekly intervals to find the lowest dose that adequately controls his symptoms.  For recalcitrant symptoms despite dose escalation, repeat intra-articular corticosteroid injection can be considered.

## 2022-05-26 NOTE — Patient Instructions (Signed)
-   Restart gabapentin 300 mg (1 capsule) nightly - After 1 week, if symptoms persist, increase to 600 mg (2 capsules) nightly - Can further increase to 900 mg (3 capsules) nightly if needed, do not exceed this dose - Take lowest dose necessary to adequately control symptoms - Start home exercises for shoulder and knee with information provided - Use patellar stabilizing brace during athletic/activity and as needed for knee pain - Can use Voltaren gel (diclofenac 1%) twice daily as needed - If knee and/or thumb symptoms persist, obtain knee x-rays and schedule visit - Contact for questions and follow-up as needed

## 2022-05-26 NOTE — Assessment & Plan Note (Signed)
Chronic issue demonstrating full symptom control. See additional assessment(s) for plan details.

## 2022-05-26 NOTE — Assessment & Plan Note (Signed)
Patient returns for follow-up to chronic right shoulder pain in the setting of right rotator cuff tendinopathy.  Of note, at his last visit on 03/11/2022 he received a subacromial corticosteroid injection with excellent response that has lasted.  His examination does demonstrate 5/5 rotator cuff strength throughout however limited external rotation and reaching behind the back (extension, internal rotation).  He does demonstrate negative axial loading, negative impingement features, and provocative testing overall is reassuring/negative.  At this stage have encouraged patient to advance home exercises with a focus on attaining full painless range of motion and maintain strength.  He can follow-up on as-needed basis for this issue.

## 2022-05-26 NOTE — Assessment & Plan Note (Signed)
Patient brings up few weeks history of right anterior and posterior knee pain, noted during activity and athletics.  Examination with tenderness at the patellar facets, pain with deep flexion with range of motion 0-110 degrees, limited by pain, subtle tenderness at the anterolateral joint line, no ligamentous laxity, provocative testing otherwise benign. Dynamic maltracking is appreciated.  Clinical history and features are most consistent with patellofemoral maltracking related arthralgia, given age, activity level, element of degenerative change anticipated.  Did discuss 2-week twice daily course of topical diclofenac, patellar stabilizing brace, home-based rehab to focus on patellar stabilization, and he is to contact us if symptoms persist.  If so, x-ray order has been placed for patient to proceed with prior to follow-up.  He can otherwise follow-up as needed.

## 2022-05-26 NOTE — Progress Notes (Signed)
Primary Care / Sports Medicine Office Visit  Patient Information:  Patient ID: Randy Watson, male DOB: [birth year] Age: 61 y.o. MRN: [id number]   Randy Watson is a pleasant 61 y.o. male presenting with the following:  Chief Complaint  Patient presents with   Tendinopathy of rotator cuff, right    No pain right now, been out of Gabapentin for 5 days   thumb pain    Hurts, tingling at tip.     Vitals:   05/26/22 0927  BP: 122/84  Pulse: 68  SpO2: 97%   Vitals:   05/26/22 0927  Weight: 277 lb (125.6 kg)  Height: '6\' 2"'$  (1.88 m)   Body mass index is 35.56 kg/m.  No results found.   Independent interpretation of notes and tests performed by another provider:   None  Procedures performed:   None  Pertinent History, Exam, Impression, and Recommendations:   Problem List Items Addressed This Visit       Musculoskeletal and Integument   Tendinopathy of rotator cuff, right    Patient returns for follow-up to chronic right shoulder pain in the setting of right rotator cuff tendinopathy.  Of note, at his last visit on 03/11/2022 he received a subacromial corticosteroid injection with excellent response that has lasted.  His examination does demonstrate 5/5 rotator cuff strength throughout however limited external rotation and reaching behind the back (extension, internal rotation).  He does demonstrate negative axial loading, negative impingement features, and provocative testing overall is reassuring/negative.  At this stage have encouraged patient to advance home exercises with a focus on attaining full painless range of motion and maintain strength.  He can follow-up on as-needed basis for this issue.      Primary osteoarthritis of first carpometacarpal joint of right hand - Primary    Patient presents for follow-up to chronic right thumb pain in the setting of for [facility] osteoarthritis.  Of note he received intra-articular cortisone injection on 03/11/2022, has  noted excellent response with only recent subtle worsening.  He had been placed on gabapentin for additional comorbidities and had noted improvement in pain at that time while on gabapentin.  This may reflect a portion of his pain stemming from cervical radiculopathy/neuropathic pain in general.  His examination shows trace/mild tenderness only with deep palpation about the first [place], negative grind test.  Given the above, plan to restart and titrate gabapentin at weekly intervals to find the lowest dose that adequately controls his symptoms.  For recalcitrant symptoms despite dose escalation, repeat intra-articular corticosteroid injection can be considered.      Arthritis of right glenohumeral joint    Chronic issue demonstrating full symptom control. See additional assessment(s) for plan details.      Cervical spondylosis    Symptoms improved with gabapentin 300 mg nightly, given persistent symptoms plan to restart this medication with weekly titration to find lowest dose that adequately controls symptoms.      Relevant Medications   gabapentin (NEURONTIN) 300 MG capsule     Other   Patellofemoral arthralgia of right knee    Patient brings up few weeks history of right anterior and posterior knee pain, noted during activity and athletics.  Examination with tenderness at the patellar facets, pain with deep flexion with range of motion 0-110 degrees, limited by pain, subtle tenderness at the anterolateral joint line, no ligamentous laxity, provocative testing otherwise benign. Dynamic maltracking is appreciated.  Clinical history and features are most consistent with patellofemoral maltracking  related arthralgia, given age, activity level, element of degenerative change anticipated.  Did discuss 2-week twice daily course of topical diclofenac, patellar stabilizing brace, home-based rehab to focus on patellar stabilization, and he is to contact us if symptoms persist.  If so, x-ray order has  been placed for patient to proceed with prior to follow-up.  He can otherwise follow-up as needed.      Relevant Orders   DG Knee Complete 4 Views Right     Orders & Medications Meds ordered this encounter  Medications   gabapentin (NEURONTIN) 300 MG capsule    Sig: Take 1 capsule (300 mg total) by mouth at bedtime as needed. Can increase by 1 capsule every 7 days, do not exceed 3 capsules (900 mg) nightly. Take lowest dose that adequately controls pain.    Dispense:  90 capsule    Refill:  1   Orders Placed This Encounter  Procedures   DG Knee Complete 4 Views Right     Return if symptoms worsen or fail to improve.     Montel Culver, MD   Primary Care Sports Medicine [place]

## 2022-05-26 NOTE — Assessment & Plan Note (Signed)
Symptoms improved with gabapentin 300 mg nightly, given persistent symptoms plan to restart this medication with weekly titration to find lowest dose that adequately controls symptoms.

## 2022-06-23 ENCOUNTER — Encounter: Payer: Self-pay | Admitting: Family Medicine

## 2022-06-23 ENCOUNTER — Ambulatory Visit: Payer: BC Managed Care – PPO | Admitting: Family Medicine

## 2022-06-23 VITALS — BP 136/78 | HR 76 | Ht 74.0 in | Wt 273.8 lb

## 2022-06-23 DIAGNOSIS — R200 Anesthesia of skin: Secondary | ICD-10-CM | POA: Diagnosis not present

## 2022-06-23 DIAGNOSIS — G5731 Lesion of lateral popliteal nerve, right lower limb: Secondary | ICD-10-CM

## 2022-06-23 DIAGNOSIS — G5601 Carpal tunnel syndrome, right upper limb: Secondary | ICD-10-CM

## 2022-06-23 DIAGNOSIS — R202 Paresthesia of skin: Secondary | ICD-10-CM | POA: Diagnosis not present

## 2022-06-23 NOTE — Progress Notes (Signed)
Subjective:    Patient ID: Randy Watson, male    DOB: 05/15/1961, 61 y.o.   MRN: [id number]  Randy Watson is a 61 y.o. male presenting on 06/23/2022 for Numbness   HPI  Right Thumb Numbness, paresthesia Reports onset over past few weeks with new gradual problem, distal pad of R Thumb with numbness, reduced sensation. No significant other symptoms in other fingers. He may have mild R index finger numbness  Following with Dr Zigmund Daniel - Sports Medicine  Received R thumb CMC injection cortisone on 03/11/22 and also shoulder injection, improvement overall.  Then return visit on 05/26/22 and the R Thumb bothering him again.  His dose of Gabapentin '300mg'$  was inc to '300mg'$  x 2 = '600mg'$  AT NIGHT  Admits with grip feels pins and needles sensation in R thumb.  Peroneal Nerve Entrapment Right Lower Extremity Paresthesia Reports has had issues with R lower extremity lateral with "warm sensation" running down shin towards ankle     06/23/2022    8:15 AM 03/11/2022    8:47 AM 01/29/2022    1:53 PM  Depression screen PHQ 2/9  Decreased Interest 0 0 0  Down, Depressed, Hopeless 0 0 0  PHQ - 2 Score 0 0 0  Altered sleeping 0 1 0  Tired, decreased energy 0 0 0  Change in appetite 0 0 0  Feeling bad or failure about yourself  0 0 0  Trouble concentrating 0 0 0  Moving slowly or fidgety/restless 0 0 0  Suicidal thoughts 0 0 0  PHQ-9 Score 0 1 0  Difficult doing work/chores Not difficult at all Not difficult at all Not difficult at all    Social History   Tobacco Use   Smoking status: Never   Smokeless tobacco: Former    Types: Chew    Quit date: 01/01/2019   Tobacco comments:    dip  Vaping Use   Vaping Use: Never used  Substance Use Topics   Alcohol use: Yes    Alcohol/week: 1.0 - 4.0 standard drink of alcohol    Types: 1 - 4 Standard drinks or equivalent per week    Comment: Only on the weekends   Drug use: Never    Review of Systems Per HPI unless specifically  indicated above     Objective:    BP 136/78 (BP Location: Left Arm, Cuff Size: Normal)   Pulse 76   Ht '6\' 2"'$  (1.88 m)   Wt 273 lb 12.8 oz (124.2 kg)   SpO2 98%   BMI 35.15 kg/m   Wt Readings from Last 3 Encounters:  06/23/22 273 lb 12.8 oz (124.2 kg)  05/26/22 277 lb (125.6 kg)  03/11/22 278 lb (126.1 kg)    Physical Exam Vitals and nursing note reviewed.  Constitutional:      General: He is not in acute distress.    Appearance: Normal appearance. He is well-developed. He is not diaphoretic.     Comments: Well-appearing, comfortable, cooperative  HENT:     Head: Normocephalic and atraumatic.  Eyes:     General:        Right eye: No discharge.        Left eye: No discharge.     Conjunctiva/sclera: Conjunctivae normal.  Cardiovascular:     Rate and Rhythm: Normal rate.  Pulmonary:     Effort: Pulmonary effort is normal.  Skin:    General: Skin is warm and dry.     Findings: No erythema or  rash.  Neurological:     Mental Status: He is alert and oriented to person, place, and time.  Psychiatric:        Mood and Affect: Mood normal.        Behavior: Behavior normal.        Thought Content: Thought content normal.     Comments: Well groomed, good eye contact, normal speech and thoughts    Results for orders placed or performed in visit on 01/29/22  POCT urinalysis dipstick  Result Value Ref Range   Color, UA     Clarity, UA     Glucose, UA Negative Negative   Bilirubin, UA Negative    Ketones, UA Negative    Spec Grav, UA 1.010 1.010 - 1.025   Blood, UA Negative    pH, UA 5.0 5.0 - 8.0   Protein, UA Negative Negative   Urobilinogen, UA 0.2 0.2 or 1.0 E.U./dL   Nitrite, UA Negative    Leukocytes, UA Negative Negative   Appearance     Odor        Assessment & Plan:   Problem List Items Addressed This Visit   None Visit Diagnoses     Numbness and tingling of right thumb    -  Primary   Relevant Orders   Ambulatory referral to Neurology   Right carpal  tunnel syndrome       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  R Sided thumb numbness seems most likely localized symptom from [facility] arthritis vs carpal tunnel  R lower leg seems consistent with peroneal nerve entrapment  -------  Referral to Dr Lanelle Bal Neurology for Right hand Nerve Conduction Study (NCS) for evaluation of possible R Carpal Tunnel.  If nerve testing is normal, then likely the problem is due to the thumb base / arthritis, then would reconsult w Dr Zigmund Daniel  If nerve testing confirms carpal tunnel, then likely need wrist injection and or future procedure.  Keep on Gabapentin '600mg'$ .  - Also with RIGHT Peroneal superficial nerve entrapment into lower leg, likely pinching with muscles / spasm or activity with foot flexion  This is likely cause of your symptoms  Likely needs a nerve conduction and maybe injection or other therapy.  No sign of DVT or blood clot (sudden worsening swelling, redness, calf pain)   Orders Placed This Encounter  Procedures   Ambulatory referral to Neurology    Referral Priority:   Routine    Referral Type:   Consultation    Referral Reason:   Specialty Services Required    Requested Specialty:   Neurology    Number of Visits Requested:   1      Follow up plan: Return if symptoms worsen or fail to improve.  [name], [facility] 06/23/2022, 8:33 AM

## 2022-06-23 NOTE — Patient Instructions (Addendum)
Thank you for coming to the office today.  Referral to Dr Manuella Ghazi [facility] Neurology for Right hand Nerve Conduction Study (NCS) for evaluation of possible R Carpal Tunnel.  If nerve testing is normal, then likely the problem is due to the thumb base / arthritis, then would reconsult w Dr Zigmund Daniel  If nerve testing confirms carpal tunnel, then likely need wrist injection and or future procedure.  Keep on Gabapentin '600mg'$ .  - Also with RIGHT Peroneal superficial nerve entrapment into lower leg, likely pinching with muscles / spasm or activity with foot flexion  This is likely cause of your symptoms  Likely needs a nerve conduction and maybe injection or other therapy.  No sign of DVT or blood clot (sudden worsening swelling, redness, calf pain)  Please schedule a Follow-up Appointment to: Return if symptoms worsen or fail to improve.  If you have any other questions or concerns, please feel free to call the office or send a message through [place]. You may also schedule an earlier appointment if necessary.  Additionally, you may be receiving a survey about your experience at our office within a few days to 1 week by e-mail or mail. We value your feedback.  [name], DO [place]

## 2022-07-20 ENCOUNTER — Encounter: Payer: Self-pay | Admitting: Family Medicine

## 2022-07-22 ENCOUNTER — Encounter: Payer: Self-pay | Admitting: Family Medicine

## 2022-07-22 ENCOUNTER — Ambulatory Visit: Payer: BC Managed Care – PPO | Admitting: Family Medicine

## 2022-07-22 VITALS — BP 120/68 | HR 70 | Ht 74.0 in | Wt 280.0 lb

## 2022-07-22 DIAGNOSIS — D485 Neoplasm of uncertain behavior of skin: Secondary | ICD-10-CM

## 2022-07-22 NOTE — Patient Instructions (Addendum)
Thank you for coming to the office today.  Referral to Dermatologist, urgent referral - they should contact you this week. If not heard back within 1-2 weeks, please let me know asap, or call them.  [address]   [place], [place] [address] Hours: 8AM-5PM Phone: [phone]  ----------------------------------------------  Possible Basal Cell or Squamous Cell Skin Cancer. Otherwise, if non-cancerous than it still likely needs to be frozen.   Please schedule a Follow-up Appointment to: No follow-ups on file.  If you have any other questions or concerns, please feel free to call the office or send a message through [place]. You may also schedule an earlier appointment if necessary.  Additionally, you may be receiving a survey about your experience at our office within a few days to 1 week by e-mail or mail. We value your feedback.  [name], DO [place]

## 2022-07-22 NOTE — Progress Notes (Signed)
Subjective:    Patient ID: Randy Watson, male    DOB: 1961/05/31, 61 y.o.   MRN: [id number]  Randy Watson is a 61 y.o. male presenting on 07/22/2022 for Mass (On nose that won't heal. /)   HPI  Skin lesion, abnormal, non healing, on Nose Reports new problem > 1 month with non healing skin lesion on tip of nose, admits sun exposed area, no injury or other other trigger, he has had raised spot that scabs and does not heal. No prior skin cancer. He has fam history father with non melanoma skin cancer removed from face before. Denies any pain or drainage of pus or redness fever chills, other abnormal skin spots       06/23/2022    8:15 AM 03/11/2022    8:47 AM 01/29/2022    1:53 PM  Depression screen PHQ 2/9  Decreased Interest 0 0 0  Down, Depressed, Hopeless 0 0 0  PHQ - 2 Score 0 0 0  Altered sleeping 0 1 0  Tired, decreased energy 0 0 0  Change in appetite 0 0 0  Feeling bad or failure about yourself  0 0 0  Trouble concentrating 0 0 0  Moving slowly or fidgety/restless 0 0 0  Suicidal thoughts 0 0 0  PHQ-9 Score 0 1 0  Difficult doing work/chores Not difficult at all Not difficult at all Not difficult at all    Social History   Tobacco Use   Smoking status: Never   Smokeless tobacco: Former    Types: Chew    Quit date: 01/01/2019   Tobacco comments:    dip  Vaping Use   Vaping Use: Never used  Substance Use Topics   Alcohol use: Yes    Alcohol/week: 1.0 - 4.0 standard drink of alcohol    Types: 1 - 4 Standard drinks or equivalent per week    Comment: Only on the weekends   Drug use: Never    Review of Systems Per HPI unless specifically indicated above     Objective:    BP 120/68   Pulse 70   Ht '6\' 2"'$  (1.88 m)   Wt 280 lb (127 kg)   SpO2 97%   BMI 35.95 kg/m   Wt Readings from Last 3 Encounters:  07/22/22 280 lb (127 kg)  06/23/22 273 lb 12.8 oz (124.2 kg)  05/26/22 277 lb (125.6 kg)    Physical Exam Vitals and nursing note reviewed.   Constitutional:      General: He is not in acute distress.    Appearance: Normal appearance. He is well-developed. He is not diaphoretic.     Comments: Well-appearing, comfortable, cooperative  HENT:     Head: Normocephalic and atraumatic.  Eyes:     General:        Right eye: No discharge.        Left eye: No discharge.     Conjunctiva/sclera: Conjunctivae normal.  Cardiovascular:     Rate and Rhythm: Normal rate.  Pulmonary:     Effort: Pulmonary effort is normal.  Skin:    General: Skin is warm and dry.     Findings: Lesion (see photo, lesion on nose) present. No erythema or rash.  Neurological:     Mental Status: He is alert and oriented to person, place, and time.  Psychiatric:        Mood and Affect: Mood normal.        Behavior: Behavior normal.  Thought Content: Thought content normal.     Comments: Well groomed, good eye contact, normal speech and thoughts      Nose   Results for orders placed or performed in visit on 01/29/22  POCT urinalysis dipstick  Result Value Ref Range   Color, UA     Clarity, UA     Glucose, UA Negative Negative   Bilirubin, UA Negative    Ketones, UA Negative    Spec Grav, UA 1.010 1.010 - 1.025   Blood, UA Negative    pH, UA 5.0 5.0 - 8.0   Protein, UA Negative Negative   Urobilinogen, UA 0.2 0.2 or 1.0 E.U./dL   Nitrite, UA Negative    Leukocytes, UA Negative Negative   Appearance     Odor        Assessment & Plan:   Problem List Items Addressed This Visit   None Visit Diagnoses     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 He has no history or fam history of melanoma. He has significant fam history father with multiple basal cell or squamous cell skin  cancers.    Orders Placed This Encounter  Procedures   Ambulatory referral to Dermatology    Referral Priority:   Urgent    Referral Type:   Consultation    Referral Reason:   Specialty Services Required    Requested Specialty:   Dermatology    Number of Visits Requested:   1     No orders of the defined types were placed in this encounter.     Follow up plan: Return if symptoms worsen or fail to improve.   [name], [facility] 07/22/2022, 11:39 AM

## 2022-07-30 ENCOUNTER — Ambulatory Visit: Payer: BC Managed Care – PPO | Admitting: Dermatology

## 2022-08-28 ENCOUNTER — Other Ambulatory Visit: Payer: Self-pay | Admitting: Family Medicine

## 2022-08-28 DIAGNOSIS — M47812 Spondylosis without myelopathy or radiculopathy, cervical region: Secondary | ICD-10-CM

## 2022-08-28 NOTE — Telephone Encounter (Signed)
Requested medications are due for refill today.  unsure  Requested medications are on the active medications list.  yes  Last refill. 05/26/2022 #90 1 rf  Future visit scheduled.   yes  Notes to clinic.  Rx Written by Dr.  Zigmund Daniel.  - Dx code needed.    Requested Prescriptions  Pending Prescriptions Disp Refills   gabapentin (NEURONTIN) 300 MG capsule [Pharmacy Med Name: GABAPENTIN 300 MG CAPSULE] 90 capsule 1    Sig: Take 1 capsule (300 mg total) by mouth at bedtime as needed. Can increase by 1 capsule every 7 days, do not exceed 3 capsules (900 mg) nightly. Take lowest dose that adequately controls pain.     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

## 2022-09-29 IMAGING — US US SCROTUM W/ DOPPLER COMPLETE
1 series · 14 of 25 positions shown · non-contrast
Comparison: None Available.

CLINICAL DATA: Blood in semen

EXAM:
SCROTAL ULTRASOUND
DOPPLER ULTRASOUND OF THE TESTICLES
TECHNIQUE: Complete ultrasound examination of the testicles, epididymis, and
other scrotal structures was performed. Color and spectral Doppler
ultrasound were also utilized to evaluate blood flow to the
testicles.

[Series 1: us scrotum w/doppler · 14 of 87 slices shown]
[im 1/87]
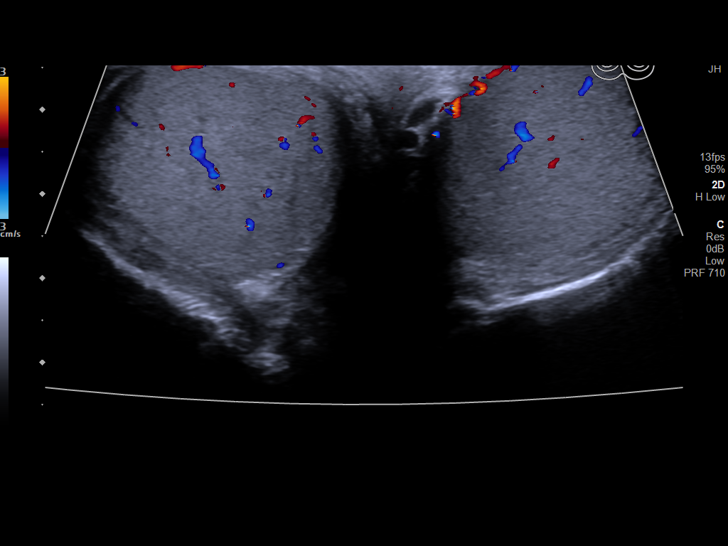
[im 8/87]
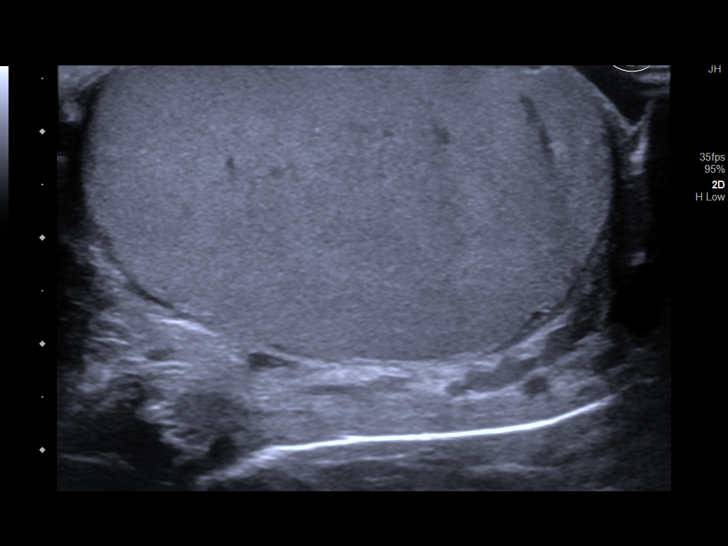
[im 15/87]
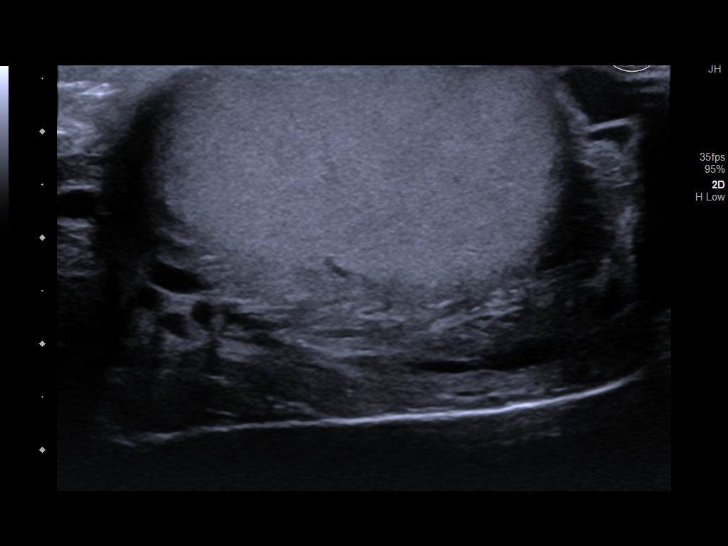
[im 22/87]
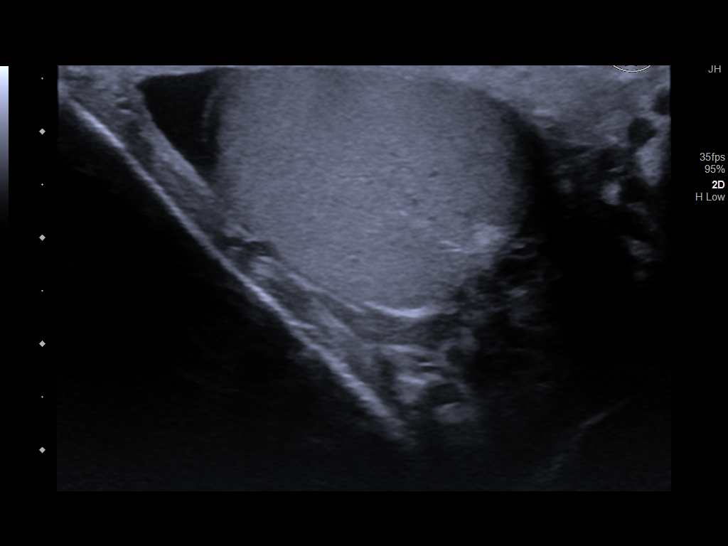
[im 29/87]
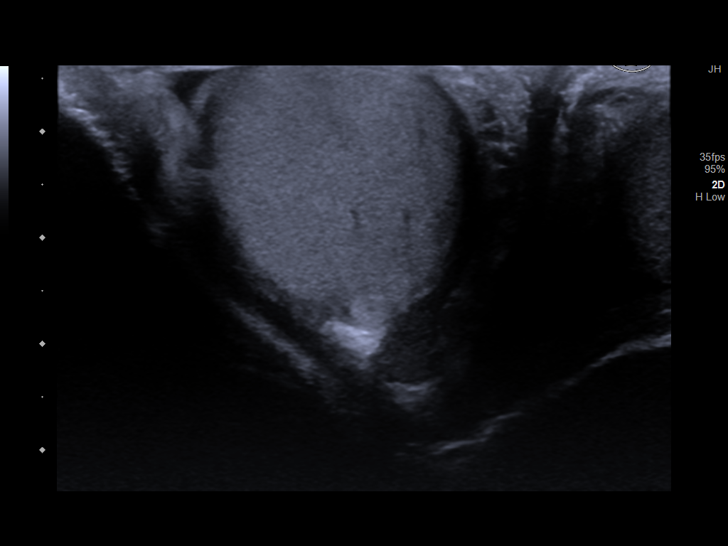
[im 33/87]
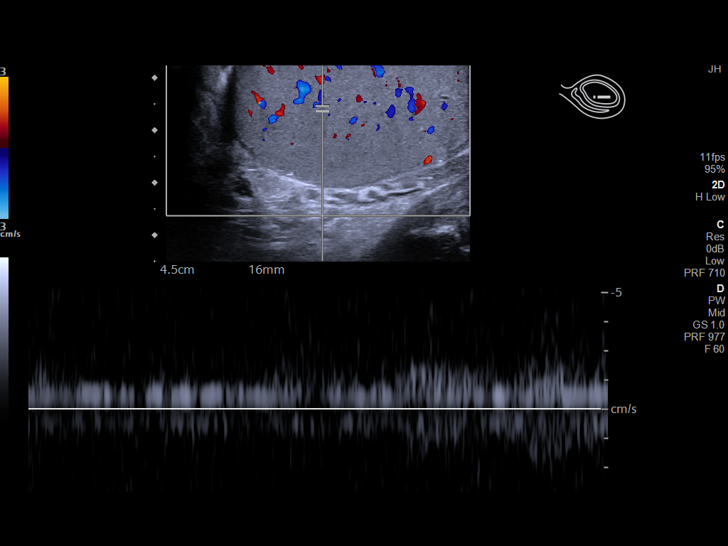
[im 40/87]
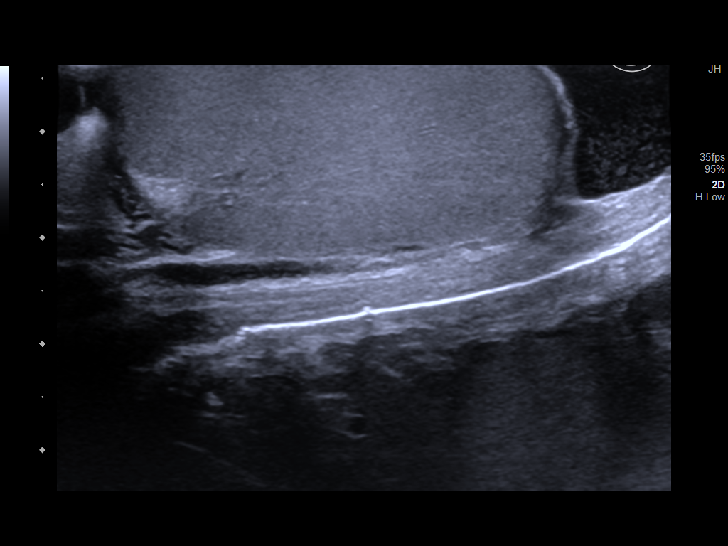
[im 47/87]
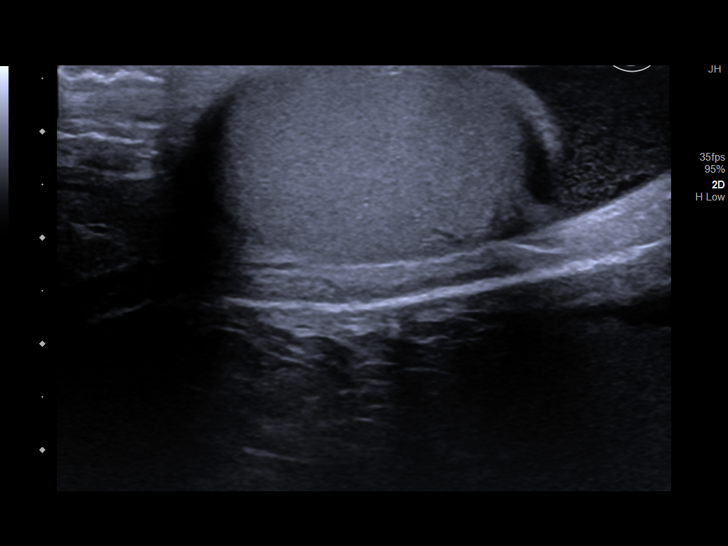
[im 54/87]
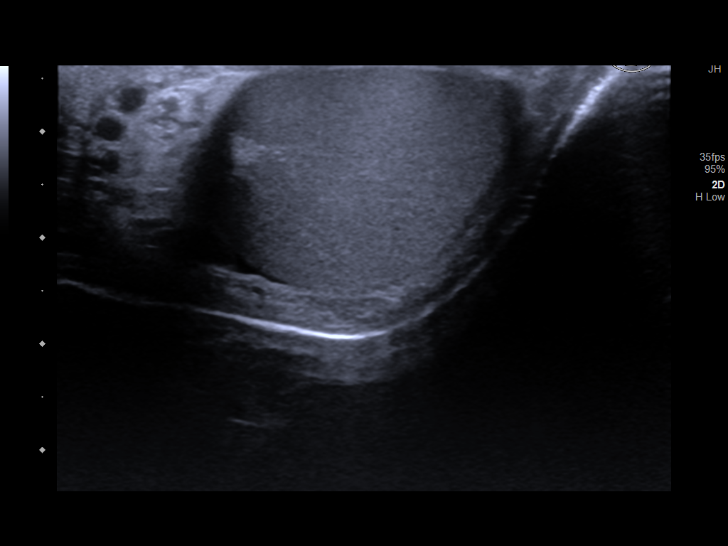
[im 58/87]
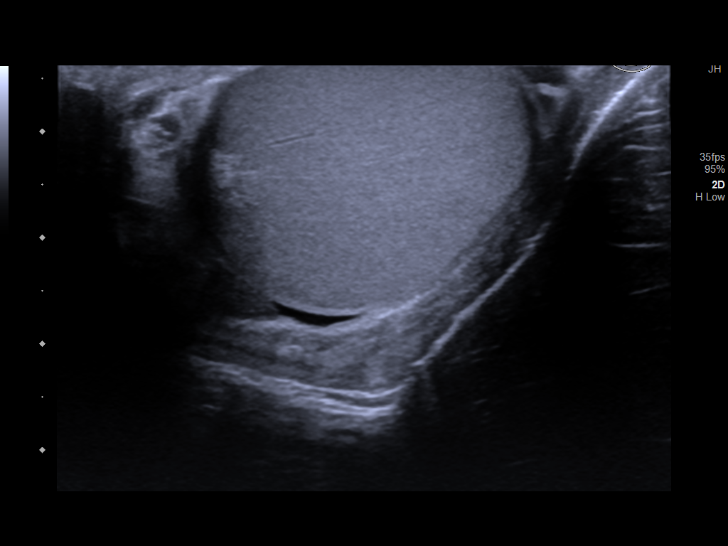
[im 65/87]
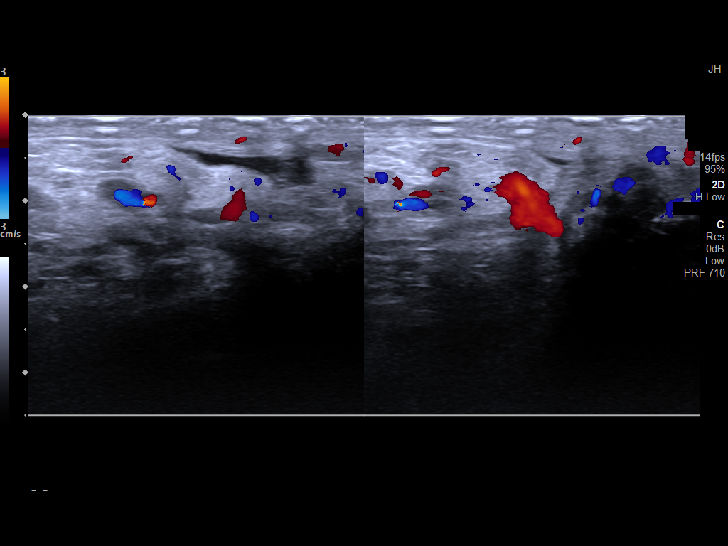
[im 72/87]
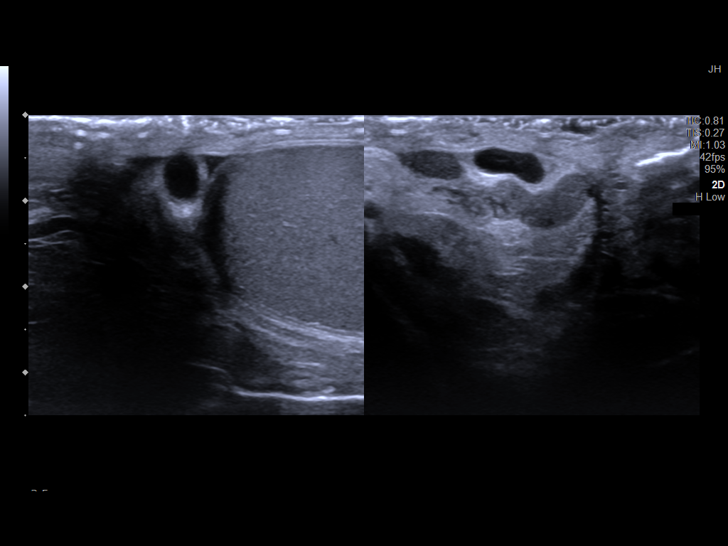
[im 79/87]
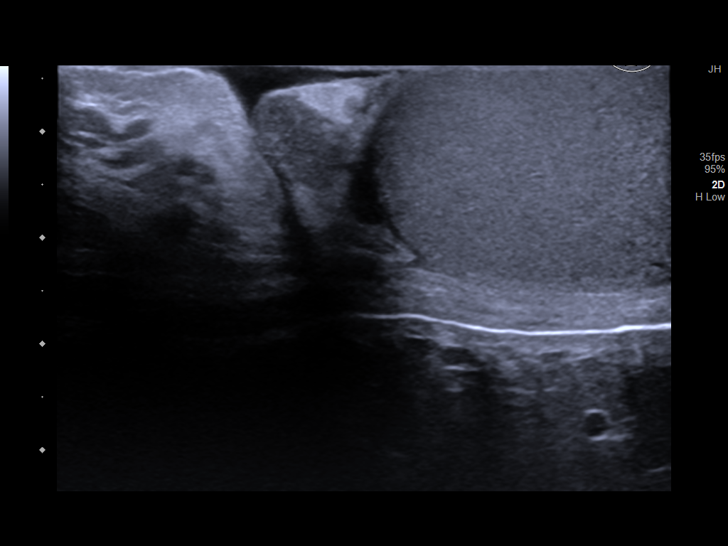
[im 87/87]
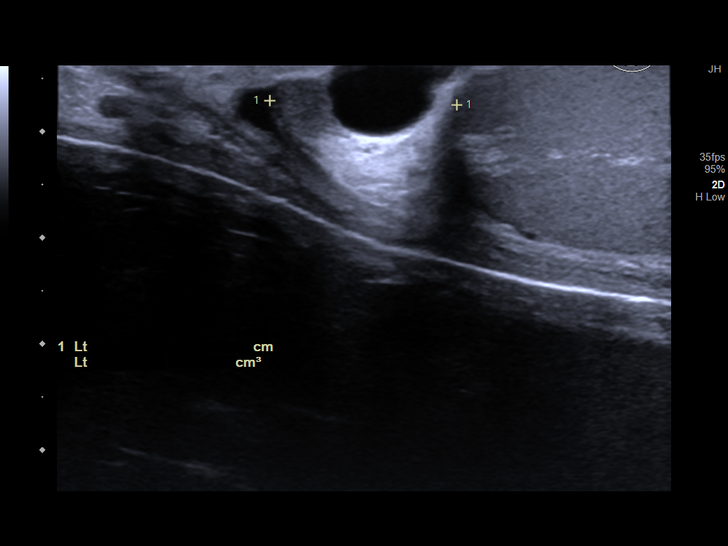

[14 of 25 positions shown; findings below may reference images not displayed]

FINDINGS: Right testicle

Measurements: 5.1 x 2.8 x 3.9 cm. No mass or microlithiasis
visualized.

Left testicle

Measurements: 4.9 x 2.2 x 3.6 cm. No mass or microlithiasis
visualized.

Right epididymis: Normal in size. Right epididymal head cyst
measuring 5 x 6 x 9 mm.

Left epididymis:  Normal in size.  Left measuring 12 x 9 x 14 mm.

Hydrocele:  None visualized.

Varicocele:  None visualized.

Pulsed Doppler interrogation of both testes demonstrates normal low
resistance arterial and venous waveforms bilaterally.
IMPRESSION: 1. Normal appearance of the bilateral testes.
2. Bilateral epididymal head cysts.

## 2022-10-30 ENCOUNTER — Encounter: Payer: Self-pay | Admitting: Family Medicine

## 2022-10-30 ENCOUNTER — Ambulatory Visit: Payer: BC Managed Care – PPO | Admitting: Family Medicine

## 2022-10-30 VITALS — BP 120/74 | HR 85 | Ht 74.0 in | Wt 275.0 lb

## 2022-10-30 DIAGNOSIS — K648 Other hemorrhoids: Secondary | ICD-10-CM

## 2022-10-30 DIAGNOSIS — B370 Candidal stomatitis: Secondary | ICD-10-CM | POA: Diagnosis not present

## 2022-10-30 DIAGNOSIS — G5601 Carpal tunnel syndrome, right upper limb: Secondary | ICD-10-CM

## 2022-10-30 DIAGNOSIS — R7309 Other abnormal glucose: Secondary | ICD-10-CM

## 2022-10-30 DIAGNOSIS — K625 Hemorrhage of anus and rectum: Secondary | ICD-10-CM

## 2022-10-30 MED ORDER — MAGIC MOUTHWASH W/LIDOCAINE: ORAL | 1 refills | Status: DC

## 2022-10-30 MED ORDER — HYDROCORTISONE ACETATE 25 MG RE SUPP: 25.0000 mg | Freq: Two times a day (BID) | RECTAL | 1 refills | Status: DC | PRN

## 2022-10-30 NOTE — Progress Notes (Signed)
Subjective:    Patient ID: Randy Watson, male    DOB: 1961-01-10, 62 y.o.   MRN: [id number]  Randy Watson is a 62 y.o. male presenting on 10/30/2022 for Thrush   HPI  Bright Red Blood Per Rectum Hemorrhoids Constipation recent 1 week constipation, notable amount of dripping bright red blood, no real pain. Then it improved, he used hemorrhoid cream topical with relief. He has past history of some burning hemorrhoid pain but not having that now. Constipation improved on miralax.  Thrush Oral Reports mouth was burning and irritated. On his tongue and roof of mouth. He had some pain and stinging. He had issue with 6 month ago had similar He believes maybe CPAP machine could trigger, used regular water to clean recently.  Carpal Tunnel, bilateral Last seen by [facility] Neurology Dr Manuella Ghazi 10/12/22 NCS confirm carpal tunnel Awaiting steroid injection approval  Elevated A1c on last lab A1c 6.7 (10/12/22) Prior range 5.9 to 6.3 range pre diabetic. He admits consuming sugar in coffee and carbs      06/23/2022    8:15 AM 03/11/2022    8:47 AM 01/29/2022    1:53 PM  Depression screen PHQ 2/9  Decreased Interest 0 0 0  Down, Depressed, Hopeless 0 0 0  PHQ - 2 Score 0 0 0  Altered sleeping 0 1 0  Tired, decreased energy 0 0 0  Change in appetite 0 0 0  Feeling bad or failure about yourself  0 0 0  Trouble concentrating 0 0 0  Moving slowly or fidgety/restless 0 0 0  Suicidal thoughts 0 0 0  PHQ-9 Score 0 1 0  Difficult doing work/chores Not difficult at all Not difficult at all Not difficult at all    Social History   Tobacco Use   Smoking status: Never   Smokeless tobacco: Former    Types: Chew    Quit date: 01/01/2019   Tobacco comments:    dip  Vaping Use   Vaping Use: Never used  Substance Use Topics   Alcohol use: Yes    Alcohol/week: 1.0 - 4.0 standard drink of alcohol    Types: 1 - 4 Standard drinks or equivalent per week    Comment: Only on the weekends    Drug use: Never    Review of Systems Per HPI unless specifically indicated above     Objective:    BP 120/74   Pulse 85   Ht 6\' 2"  (1.88 m)   Wt 275 lb (124.7 kg)   SpO2 96%   BMI 35.31 kg/m   Wt Readings from Last 3 Encounters:  10/30/22 275 lb (124.7 kg)  07/22/22 280 lb (127 kg)  06/23/22 273 lb 12.8 oz (124.2 kg)    Physical Exam Vitals and nursing note reviewed.  Constitutional:      General: He is not in acute distress.    Appearance: He is well-developed. He is not diaphoretic.     Comments: Well-appearing, comfortable, cooperative  HENT:     Head: Normocephalic and atraumatic.     Mouth/Throat:     Comments: Some white film on tongue, no other oral ulceration or issues Eyes:     General:        Right eye: No discharge.        Left eye: No discharge.     Conjunctiva/sclera: Conjunctivae normal.  Neck:     Thyroid: No thyromegaly.  Cardiovascular:     Rate and Rhythm: Normal rate  and regular rhythm.     Pulses: Normal pulses.     Heart sounds: Normal heart sounds. No murmur heard. Pulmonary:     Effort: Pulmonary effort is normal. No respiratory distress.     Breath sounds: No wheezing or rales.  Musculoskeletal:        General: Normal range of motion.     Cervical back: Normal range of motion and neck supple.  Lymphadenopathy:     Cervical: No cervical adenopathy.  Skin:    General: Skin is warm and dry.     Findings: No erythema or rash.  Neurological:     Mental Status: He is alert and oriented to person, place, and time. Mental status is at baseline.  Psychiatric:        Behavior: Behavior normal.     Comments: Well groomed, good eye contact, normal speech and thoughts    Results for orders placed or performed in visit on 01/29/22  POCT urinalysis dipstick  Result Value Ref Range   Color, UA     Clarity, UA     Glucose, UA Negative Negative   Bilirubin, UA Negative    Ketones, UA Negative    Spec Grav, UA 1.010 1.010 - 1.025   Blood,  UA Negative    pH, UA 5.0 5.0 - 8.0   Protein, UA Negative Negative   Urobilinogen, UA 0.2 0.2 or 1.0 E.U./dL   Nitrite, UA Negative    Leukocytes, UA Negative Negative   Appearance     Odor        Assessment & Plan:   Problem List Items Addressed This Visit     Elevated hemoglobin A1c   Other Visit Diagnoses     Internal hemorrhoids    -  Primary   Relevant Medications   hydrocortisone (ANUSOL-HC) 25 MG suppository   Oral thrush       Relevant Medications   magic mouthwash w/lidocaine SOLN   Right carpal tunnel syndrome       Rectal bleeding           Elevated A1c At risk Diabetes Recent lab from Neurology Caution with elevated A1c up to 6.7, try to limit carb starch sugar before next blood draw in April Goal < 6.5 to avoid type 2 diabetes.  Upcoming future injection / carpal tunnel. - Per Neurology  Hemorrhoids Seems to have internal hemorrhoids with the bleeding and w/o pain Rx Anusol suppository if internal hemorrhoid bothers you again, bleeding or discomfort. Usually burning pain is on the outside and use the topical cream.  Oral Thrush Seems resolving, unsure if clear etiology Rx  printed [facility]ukes Magic Mouthwash, added 1 refill. Likely the CPAP related trigger.  Meds ordered this encounter  Medications   hydrocortisone (ANUSOL-HC) 25 MG suppository    Sig: Place 1 suppository (25 mg total) rectally 2 (two) times daily as needed for hemorrhoids or anal itching. For 7 days    Dispense:  14 suppository    Refill:  1   magic mouthwash w/lidocaine SOLN    Sig: Swish, gargle, and spit one to two teaspoonfuls every six hours as needed. Shake well before using.    Dispense:  120 mL    Refill:  1    1 Part viscous lidocaine 2%  1 Part Maalox  1 Part diphenhydramine 12.5 mg per 5 ml elixir 1 Part Nystatin      Follow up plan: Return if symptoms worsen or fail to improve.  Already scheduled in  April/May for yearly  [name], [facility] 10/30/2022, 10:58 AM

## 2022-10-30 NOTE — Patient Instructions (Addendum)
Thank you for coming to the office today.  Caution with elevated A1c up to 6.7, try to limit carb starch sugar before next blood draw in April  Goal < 6.5 to avoid type 2 diabetes.  Upcoming future injection / carpal tunnel.  Anusol suppository if internal hemorrhoid bothers you again, bleeding or discomfort. Usually burning pain is on the outside and use the topical cream.  Dukes Magic Mouthwash, added 1 refill. Likely the CPAP related trigger.  Please schedule a Follow-up Appointment to: Return if symptoms worsen or fail to improve.  If you have any other questions or concerns, please feel free to call the office or send a message through [place]. You may also schedule an earlier appointment if necessary.  Additionally, you may be receiving a survey about your experience at our office within a few days to 1 week by e-mail or mail. We value your feedback.  [name], DO [place]

## 2022-11-27 ENCOUNTER — Other Ambulatory Visit: Payer: Self-pay | Admitting: Family Medicine

## 2022-11-27 DIAGNOSIS — Z8673 Personal history of transient ischemic attack (TIA), and cerebral infarction without residual deficits: Secondary | ICD-10-CM

## 2022-11-27 NOTE — Telephone Encounter (Signed)
Requested Prescriptions  Pending Prescriptions Disp Refills   atorvastatin (LIPITOR) 40 MG tablet [Pharmacy Med Name: ATORVASTATIN 40 MG TABLET] 90 tablet 0    Sig: TAKE 1 TABLET BY MOUTH EVERYDAY AT BEDTIME     Cardiovascular:  Antilipid - Statins Failed - 11/27/2022  1:37 AM      Failed - Lipid Panel in normal range within the last 12 months    Cholesterol  Date Value Ref Range Status  12/04/2021 121 <200 mg/dL Final   LDL Cholesterol (Calc)  Date Value Ref Range Status  12/04/2021 49 mg/dL (calc) Final    Comment:    Reference range: <100 . Desirable range <100 mg/dL for primary prevention;   <70 mg/dL for patients with CHD or diabetic patients  with > or = 2 CHD risk factors. Marland Kitchen LDL-C is now calculated using the Martin-Hopkins  calculation, which is a validated novel method providing  better accuracy than the Friedewald equation in the  estimation of LDL-C.  Horald Pollen et al. Lenox Ahr. 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   [facility] [place], Netta Neat, DO   5 months ago Numbness and tingling of right thumb   [place] [facility] Smitty Cords, DO   6 months ago Primary osteoarthritis of first carpometacarpal joint of right hand   [place] [facility], Ocie Bob, MD   8 months ago Tendinopathy of rotator cuff, right   [facility], Ocie Bob, MD       Future Appointments             In 2 weeks Althea Charon, Netta Neat, DO [facility], [facility]

## 2022-12-07 ENCOUNTER — Other Ambulatory Visit: Payer: BC Managed Care – PPO

## 2022-12-07 DIAGNOSIS — Z0000 Encounter for general adult medical examination without abnormal findings: Secondary | ICD-10-CM

## 2022-12-07 DIAGNOSIS — I6930 Unspecified sequelae of cerebral infarction: Secondary | ICD-10-CM

## 2022-12-07 DIAGNOSIS — E538 Deficiency of other specified B group vitamins: Secondary | ICD-10-CM

## 2022-12-07 DIAGNOSIS — R351 Nocturia: Secondary | ICD-10-CM

## 2022-12-07 DIAGNOSIS — E559 Vitamin D deficiency, unspecified: Secondary | ICD-10-CM

## 2022-12-07 DIAGNOSIS — E782 Mixed hyperlipidemia: Secondary | ICD-10-CM

## 2022-12-07 DIAGNOSIS — R7309 Other abnormal glucose: Secondary | ICD-10-CM

## 2022-12-08 LAB — CBC WITH DIFFERENTIAL/PLATELET
Absolute Monocytes: 576 cells/uL (ref 200–950)
Basophils Absolute: 50 cells/uL (ref 0–200)
Basophils Relative: 0.7 %
Eosinophils Absolute: 158 cells/uL (ref 15–500)
Eosinophils Relative: 2.2 %
HCT: 43.3 % (ref 38.5–50.0)
Hemoglobin: 14.5 g/dL (ref 13.2–17.1)
Lymphs Abs: 1944 cells/uL (ref 850–3900)
MCH: 31.4 pg (ref 27.0–33.0)
MCHC: 33.5 g/dL (ref 32.0–36.0)
MCV: 93.7 fL (ref 80.0–100.0)
MPV: 9.3 fL (ref 7.5–12.5)
Monocytes Relative: 8 %
Neutro Abs: 4471 cells/uL (ref 1500–7800)
Neutrophils Relative %: 62.1 %
Platelets: 304 10*3/uL (ref 140–400)
RBC: 4.62 10*6/uL (ref 4.20–5.80)
RDW: 11.9 % (ref 11.0–15.0)
Total Lymphocyte: 27 %
WBC: 7.2 10*3/uL (ref 3.8–10.8)

## 2022-12-08 LAB — VITAMIN B12: Vitamin B-12: 940 pg/mL (ref 200–1100)

## 2022-12-08 LAB — LIPID PANEL
Cholesterol: 133 mg/dL (ref <200)
HDL: 58 mg/dL (ref >=40)
LDL Cholesterol (Calc): 55 mg/dL (calc)
Non-HDL Cholesterol (Calc): 75 mg/dL (calc) (ref <130)
Total CHOL/HDL Ratio: 2.3 (calc) (ref <5.0)
Triglycerides: 117 mg/dL (ref <150)

## 2022-12-08 LAB — COMPLETE METABOLIC PANEL WITH GFR
AG Ratio: 1.7 (calc) (ref 1.0–2.5)
ALT: 45 U/L (ref 9–46)
AST: 27 U/L (ref 10–35)
Albumin: 4 g/dL (ref 3.6–5.1)
Alkaline phosphatase (APISO): 124 U/L (ref 35–144)
BUN: 13 mg/dL (ref 7–25)
CO2: 28 mmol/L (ref 20–32)
Calcium: 9.5 mg/dL (ref 8.6–10.3)
Chloride: 103 mmol/L (ref 98–110)
Creat: 0.87 mg/dL (ref 0.70–1.35)
Globulin: 2.3 g/dL (calc) (ref 1.9–3.7)
Glucose, Bld: 163 mg/dL — ABNORMAL HIGH (ref 65–99)
Potassium: 4.5 mmol/L (ref 3.5–5.3)
Sodium: 139 mmol/L (ref 135–146)
Total Bilirubin: 0.6 mg/dL (ref 0.2–1.2)
Total Protein: 6.3 g/dL (ref 6.1–8.1)
eGFR: 98 mL/min/{1.73_m2} (ref >=60)

## 2022-12-08 LAB — PSA: PSA: 0.78 ng/mL (ref <=4.00)

## 2022-12-08 LAB — HEMOGLOBIN A1C
Hgb A1c MFr Bld: 6.7 % of total Hgb — ABNORMAL HIGH (ref <5.7)
Mean Plasma Glucose: 146 mg/dL
eAG (mmol/L): 8.1 mmol/L

## 2022-12-08 LAB — VITAMIN D 25 HYDROXY (VIT D DEFICIENCY, FRACTURES): Vit D, 25-Hydroxy: 41 ng/mL (ref 30–100)

## 2022-12-14 ENCOUNTER — Encounter: Payer: Self-pay | Admitting: Family Medicine

## 2022-12-14 ENCOUNTER — Ambulatory Visit (INDEPENDENT_AMBULATORY_CARE_PROVIDER_SITE_OTHER): Payer: BC Managed Care – PPO | Admitting: Family Medicine

## 2022-12-14 VITALS — BP 138/84 | HR 67 | Ht 74.0 in | Wt 281.0 lb

## 2022-12-14 DIAGNOSIS — M67911 Unspecified disorder of synovium and tendon, right shoulder: Secondary | ICD-10-CM

## 2022-12-14 DIAGNOSIS — Z0000 Encounter for general adult medical examination without abnormal findings: Secondary | ICD-10-CM

## 2022-12-14 DIAGNOSIS — Z1211 Encounter for screening for malignant neoplasm of colon: Secondary | ICD-10-CM

## 2022-12-14 DIAGNOSIS — I700 Atherosclerosis of aorta: Secondary | ICD-10-CM | POA: Insufficient documentation

## 2022-12-14 DIAGNOSIS — R1032 Left lower quadrant pain: Secondary | ICD-10-CM

## 2022-12-14 NOTE — Patient Instructions (Addendum)
Thank you for coming to the office today.  Call Dr Landry Mellow for R Shoulder. I believe it is more rotator cuff / structural issue at this point.  Left groin pain, can be hernia vs hip. See if Dr Landry Mellow can rule out hip or muscle, if those are normal then it is likely hernia and we can do referral.  Recent Labs    12/07/22 0822  HGBA1C 6.7*   Labs look great except high blood sugar on A1c  At risk Type 2 Diabetes  Diet Recommendations for Preventing Diabetes   REDUCE Starchy (carb) foods include: Bread, rice, pasta, potatoes, corn, crackers, bagels, muffins, all baked goods.   FRUITS - LIMIT these HIGH sugar/carb fruits = Pineapple, Watermelon, Bananas - OKAY with these MEDIUM sugar/carb fruits = Citrus, Oranges, Grapes - PREFER these LOW sugar/carb fruits = Apples, Berries, Pears, Plums  Protein foods include: Meat, fish, poultry, eggs, dairy foods, and beans such as pinto and kidney beans (beans also provide carbohydrate).   1. Eat at least 3 meals and 1-2 snacks per day. Never go more than 4-5 hours while awake without eating.   2. Limit starchy foods to TWO per meal and ONE per snack. ONE portion of a starchy  food is equal to the following:   - ONE slice of bread (or its equivalent, such as half of a hamburger bun).   - 1/2 cup of a "scoopable" starchy food such as potatoes or rice.   - 1 OUNCE (28 grams) of starchy snacks (crackers or pretzels, look on label).   - 15 grams of carbohydrate as shown on food label.   3. Both lunch and dinner should include a protein food, a carb food, and vegetables.   - Obtain twice as many veg's as protein or carbohydrate foods for both lunch and dinner.   - Try to keep frozen veg's on hand for a quick vegetable serving.     - Fresh or frozen veg's are best.   4. Breakfast should always include protein.        Please schedule a Follow-up Appointment to: Return in about 4 months (around 04/16/2023) for 4 month Pre DM A1c, updates from  Ortho/ R Shoulder/ L Groin.  If you have any other questions or concerns, please feel free to call the office or send a message through MyChart. You may also schedule an earlier appointment if necessary.  Additionally, you may be receiving a survey about your experience at our office within a few days to 1 week by e-mail or mail. We value your feedback.  Saralyn Pilar, DO [facility], [place]

## 2022-12-14 NOTE — Progress Notes (Signed)
Subjective:    Patient ID: [name], male    DOB: [birth year], 62 y.o.   MRN: [id number]  Randy Watson is a 62 y.o. male presenting on 12/14/2022 for Annual Exam   HPI  Here for Annual Physical and Lab Review   Lumbar DDD / Chronic Back Pain Hip Pain Followed by [facility]   Elevated A1c / Pre-Diabetes A1c elevated up to 6.7 this is concerning for new diagnosis of type 2 diabetes. Previously it was < 6.5 in pre diabetes range. Prior results 6.3 to 6.4 Goal to improve sugar / starches carbs  History of CVA, L cerebellar w/ PICA He continues on ASA 81, Lipitor 40mg  Now followed by Cardiology at [facility]    Vitamin D Deficiency Vitamin B12 deficiency Improved to Lab 49. On Vita D 1000unit daily Improved, resolved on B12 supplement daily   HYPERLIPIDEMIA: - Reports no concerns. Last lipid panel 11/2022 controlled on Statin therapy   CHRONIC HTN: Followed by [facility] Current Meds - Valsartan 160mg  daily Reports good compliance, took meds today. Tolerating well, w/o complaints. Denies CP, dyspnea, HA, edema, dizziness / lightheadedness   OSA, on CPAP - Patient reports prior history of dx OSA and on CPAP - Today reports that sleep apnea is well controlled. He uses the CPAP machine every night. Tolerates the machine well, and thinks that sleeps better with it and feels good. No new concerns or symptoms.     Additional history   PMH history Kidney Stone Followed by [facility] Dr Apolinar Junes   Right Shoulder Pain Bilateral Carpal Tunnel Syndrome He has had NCS done and dx carpal tunnel, and has now upcoming injection bilateral wrist scheduled w/ Neurology He still has overhead R shoulder problem. He has issue. Prior imaging shows DJD. He has had R Shoulder injection subacromial done by Sports Medicine Dr Ashley Royalty, had temporary improvement, exercises did not resolve, has not returned to Ortho. He plans to contact Dr Landry Mellow. He  has seen Dr [facility]  Left Groin Pain Leg Few weeks. No hernia or other injury Bending forward pain. Pain with bending Not bothering him with walking or moving No recent Hip X-ray but he has had hip and back pain before.     Health Maintenance:   Prostate CA Screening: Last PSA 0.78 11/2022. Currently asymptomatic. No known family history of prostate CA.    Shingles vaccine eligible, defer for now.   Colon CA Screening: Last Cologuard 10/24/19 (negative) next due in 3 years. Due now ordered 12/2022      12/14/2022    8:09 AM 06/23/2022    8:15 AM 03/11/2022    8:47 AM  Depression screen PHQ 2/9  Decreased Interest 0 0 0  Down, Depressed, Hopeless 0 0 0  PHQ - 2 Score 0 0 0  Altered sleeping  0 1  Tired, decreased energy  0 0  Change in appetite  0 0  Feeling bad or failure about yourself   0 0  Trouble concentrating  0 0  Moving slowly or fidgety/restless  0 0  Suicidal thoughts  0 0  PHQ-9 Score  0 1  Difficult doing work/chores  Not difficult at all Not difficult at all    Past Medical History:  Diagnosis Date   Allergy    Anal fissure    Constipation    GERD (gastroesophageal reflux disease)    History of chicken pox    Obesity    Stroke (HCC)  12/2018   Past Surgical History:  Procedure Laterality Date   TEE WITHOUT CARDIOVERSION N/A 05/31/2019   Procedure: TRANSESOPHAGEAL ECHOCARDIOGRAM (TEE);  Surgeon: Lamar Blinks, MD;  Location: [facility] ORS;  Service: Cardiovascular;  Laterality: N/A;   Social History   Socioeconomic History   Marital status: Married    Spouse name: Marquil Mine   Number of children: Not on file   Years of education: Not on file   Highest education level: Bachelor's degree (e.g., BA, AB, BS)  Occupational History   Not on file  Tobacco Use   Smoking status: Never   Smokeless tobacco: Former    Types: Chew    Quit date: 01/01/2019   Tobacco comments:    dip  Vaping Use   Vaping Use: Never used   Substance and Sexual Activity   Alcohol use: Yes    Alcohol/week: 1.0 - 4.0 standard drink of alcohol    Types: 1 - 4 Standard drinks or equivalent per week    Comment: Only on the weekends   Drug use: Never   Sexual activity: Yes    Partners: Female  Other Topics Concern   Not on file  Social History Narrative   Not on file   Social Determinants of Health   Financial Resource Strain: Low Risk  (10/29/2022)   Overall Financial Resource Strain (CARDIA)    Difficulty of Paying Living Expenses: Not hard at all  Food Insecurity: No Food Insecurity (10/29/2022)   Hunger Vital Sign    Worried About Running Out of Food in the Last Year: Never true    Ran Out of Food in the Last Year: Never true  Transportation Needs: No Transportation Needs (10/29/2022)   PRAPARE - Administrator, Civil Service (Medical): No    Lack of Transportation (Non-Medical): No  Physical Activity: Insufficiently Active (10/29/2022)   Exercise Vital Sign    Days of Exercise per Week: 2 days    Minutes of Exercise per Session: 30 min  Stress: No Stress Concern Present (10/29/2022)   [employer] of Occupational Health - Occupational Stress Questionnaire    Feeling of Stress : Only a little  Social Connections: Moderately Integrated (10/29/2022)   Social Connection and Isolation Panel [NHANES]    Frequency of Communication with Friends and Family: Three times a week    Frequency of Social Gatherings with Friends and Family: Once a week    Attends Religious Services: More than 4 times per year    Active Member of [employer] or Organizations: No    Attends Engineer, structural: Not on file    Marital Status: Married  Catering manager Violence: Not on file   Family History  Problem Relation Age of Onset   Cancer Father        renal/pancreatic   CAD Father    Diabetes Father    Prostate cancer Maternal Grandfather    Bladder Cancer Neg Hx    Current Outpatient Medications on File Prior to  Visit  Medication Sig   acetaminophen (TYLENOL) 500 MG tablet Take 1,000 mg by mouth every 6 (six) hours as needed for moderate pain or headache.   Ascorbic Acid (VITAMIN C PO) Take 1 tablet by mouth daily.   aspirin 81 MG EC tablet Take 81 mg by mouth daily.   atorvastatin (LIPITOR) 40 MG tablet TAKE 1 TABLET BY MOUTH EVERYDAY AT BEDTIME   cholecalciferol (VITAMIN D3) 25 MCG (1000 UT) tablet Take 1,000 Units by mouth at  bedtime.   desonide (DESOWEN) 0.05 % lotion Apply topically 2 (two) times daily as needed (rosacea). Use for up to 3-5 days then stop, may repeat   gabapentin (NEURONTIN) 300 MG capsule Take 2 capsules (600 mg total) by mouth at bedtime.   hydrocortisone (ANUSOL-HC) 25 MG suppository Place 1 suppository (25 mg total) rectally 2 (two) times daily as needed for hemorrhoids or anal itching. For 7 days   magic mouthwash w/lidocaine SOLN Swish, gargle, and spit one to two teaspoonfuls every six hours as needed. Shake well before using.   Multiple Vitamins-Minerals (ZINC PO) Take 1 tablet by mouth daily.   omeprazole (PRILOSEC OTC) 20 MG tablet Take 20-40 mg by mouth daily as needed (acid reflux).   valsartan (DIOVAN) 160 MG tablet Take 160 mg by mouth daily.   vitamin B-12 (CYANOCOBALAMIN) 1000 MCG tablet Take 1,000 mcg by mouth at bedtime.   [DISCONTINUED] triamcinolone (NASACORT ALLERGY 24HR) 55 MCG/ACT AERO nasal inhaler Place 1 spray into the nose daily as needed (allergies).   No current facility-administered medications on file prior to visit.    Review of Systems  Constitutional:  Negative for activity change, appetite change, chills, diaphoresis, fatigue and fever.  HENT:  Negative for congestion and hearing loss.   Eyes:  Negative for visual disturbance.  Respiratory:  Negative for cough, chest tightness, shortness of breath and wheezing.   Cardiovascular:  Negative for chest pain, palpitations and leg swelling.  Gastrointestinal:  Negative for abdominal pain,  constipation, diarrhea, nausea and vomiting.  Genitourinary:  Negative for dysuria, frequency and hematuria.  Musculoskeletal:  Positive for arthralgias and back pain. Negative for neck pain.  Skin:  Negative for rash.  Neurological:  Negative for dizziness, weakness, light-headedness, numbness and headaches.  Hematological:  Negative for adenopathy.  Psychiatric/Behavioral:  Negative for behavioral problems, dysphoric mood and sleep disturbance.    Per HPI unless specifically indicated above      Objective:    BP 138/84   Pulse 67   Ht 6\' 2"  (1.88 m)   Wt 281 lb (127.5 kg)   SpO2 97%   BMI 36.08 kg/m   Wt Readings from Last 3 Encounters:  12/14/22 281 lb (127.5 kg)  10/30/22 275 lb (124.7 kg)  07/22/22 280 lb (127 kg)    Physical Exam Vitals and nursing note reviewed.  Constitutional:      General: He is not in acute distress.    Appearance: He is well-developed. He is not diaphoretic.     Comments: Well-appearing, comfortable, cooperative  HENT:     Head: Normocephalic and atraumatic.  Eyes:     General:        Right eye: No discharge.        Left eye: No discharge.     Conjunctiva/sclera: Conjunctivae normal.     Pupils: Pupils are equal, round, and reactive to light.  Neck:     Thyroid: No thyromegaly.     Vascular: No carotid bruit.  Cardiovascular:     Rate and Rhythm: Normal rate and regular rhythm.     Pulses: Normal pulses.     Heart sounds: Normal heart sounds. No murmur heard. Pulmonary:     Effort: Pulmonary effort is normal. No respiratory distress.     Breath sounds: Normal breath sounds. No wheezing or rales.  Abdominal:     General: Bowel sounds are normal. There is no distension.     Palpations: Abdomen is soft. There is no mass.  Tenderness: There is no abdominal tenderness.  Musculoskeletal:        General: No tenderness.     Cervical back: Normal range of motion and neck supple.     Right lower leg: No edema.     Left lower leg: No  edema.     Comments: Upper / Lower Extremities: - Normal muscle tone, strength bilateral upper extremities 5/5, lower extremities 5/5  Left Hip with normal flexion and range of motion.  Supine exam with L Hip showing pain with internal and ext rotation. Provoked. No pain over greater trochanter  Pain localized to inguinal region deeper palpation with pain. No obvious herniation on exam today.  Lymphadenopathy:     Cervical: No cervical adenopathy.  Skin:    General: Skin is warm and dry.     Findings: No erythema or rash.  Neurological:     Mental Status: He is alert and oriented to person, place, and time.     Comments: Distal sensation intact to light touch all extremities  Psychiatric:        Mood and Affect: Mood normal.        Behavior: Behavior normal.        Thought Content: Thought content normal.     Comments: Well groomed, good eye contact, normal speech and thoughts     I have personally reviewed the radiology report from 03/10/22 on R SHOULDER XRAY.  Study Result  Narrative & Impression  CLINICAL DATA:  Acute on chronic right shoulder pain, no known injury, initial encounter   EXAM: RIGHT SHOULDER - 2+ VIEW   COMPARISON:  None Available.   FINDINGS: Degenerative changes of the glenohumeral joint are noted. Some mild remodeling of the humeral head is seen. No acute fracture or dislocation is noted. No soft tissue abnormality is seen.   IMPRESSION: Degenerative change without acute abnormality.     Electronically Signed   By: Alcide Clever M.D.   On: 03/10/2022 23:17     Results for orders placed or performed in visit on 12/07/22  Vitamin B12  Result Value Ref Range   Vitamin B-12 940 200 - 1,100 pg/mL  VITAMIN D 25 Hydroxy (Vit-D Deficiency, Fractures)  Result Value Ref Range   Vit D, 25-Hydroxy 41 30 - 100 ng/mL  PSA  Result Value Ref Range   PSA 0.78 < OR = 4.00 ng/mL  Hemoglobin A1c  Result Value Ref Range   Hgb A1c MFr Bld 6.7 (H) <5.7 %  of total Hgb   Mean Plasma Glucose 146 mg/dL   eAG (mmol/L) 8.1 mmol/L  Lipid panel  Result Value Ref Range   Cholesterol 133 <200 mg/dL   HDL 58 > OR = 40 mg/dL   Triglycerides 161 <096 mg/dL   LDL Cholesterol (Calc) 55 mg/dL (calc)   Total CHOL/HDL Ratio 2.3 <5.0 (calc)   Non-HDL Cholesterol (Calc) 75 <045 mg/dL (calc)  CBC with Differential/Platelet  Result Value Ref Range   WBC 7.2 3.8 - 10.8 Thousand/uL   RBC 4.62 4.20 - 5.80 Million/uL   Hemoglobin 14.5 13.2 - 17.1 g/dL   HCT 40.9 81.1 - 91.4 %   MCV 93.7 80.0 - 100.0 fL   MCH 31.4 27.0 - 33.0 pg   MCHC 33.5 32.0 - 36.0 g/dL   RDW 78.2 95.6 - 21.3 %   Platelets 304 140 - 400 Thousand/uL   MPV 9.3 7.5 - 12.5 fL   Neutro Abs 4,471 1,500 - 7,800 cells/uL   Lymphs Abs  1,944 850 - 3,900 cells/uL   Absolute Monocytes 576 200 - 950 cells/uL   Eosinophils Absolute 158 15 - 500 cells/uL   Basophils Absolute 50 0 - 200 cells/uL   Neutrophils Relative % 62.1 %   Total Lymphocyte 27.0 %   Monocytes Relative 8.0 %   Eosinophils Relative 2.2 %   Basophils Relative 0.7 %  COMPLETE METABOLIC PANEL WITH GFR  Result Value Ref Range   Glucose, Bld 163 (H) 65 - 99 mg/dL   BUN 13 7 - 25 mg/dL   Creat 1.61 0.96 - 0.45 mg/dL   eGFR 98 > OR = 60 WU/JWJ/1.91Y7   BUN/Creatinine Ratio SEE NOTE: 6 - 22 (calc)   Sodium 139 135 - 146 mmol/L   Potassium 4.5 3.5 - 5.3 mmol/L   Chloride 103 98 - 110 mmol/L   CO2 28 20 - 32 mmol/L   Calcium 9.5 8.6 - 10.3 mg/dL   Total Protein 6.3 6.1 - 8.1 g/dL   Albumin 4.0 3.6 - 5.1 g/dL   Globulin 2.3 1.9 - 3.7 g/dL (calc)   AG Ratio 1.7 1.0 - 2.5 (calc)   Total Bilirubin 0.6 0.2 - 1.2 mg/dL   Alkaline phosphatase (APISO) 124 35 - 144 U/L   AST 27 10 - 35 U/L   ALT 45 9 - 46 U/L      Assessment & Plan:   Problem List Items Addressed This Visit     Aortic atherosclerosis (HCC)   Morbid obesity (HCC)   Tendinopathy of rotator cuff, right   Other Visit Diagnoses     Annual physical exam    -   Primary   Colon cancer screening       Relevant Orders   Cologuard   Left groin pain           Updated Health Maintenance information Reviewed recent lab results with patient Encouraged improvement to lifestyle with diet and exercise Goal of weight loss  R Shoulder DJD / Rotator Cuff tendinopathy Prior X-ray reviewed Not improved from prior injection subacromial, rehab PT Has upcoming carpal tunnel injection but would not address this issue [facility] > Call Dr Landry Mellow for R Shoulder. I believe it is more rotator cuff / structural issue at this point.  Left groin pain, can be hernia vs hip. See if Dr Landry Mellow can rule out hip or muscle, if those are normal then it is likely hernia and we can do referral.  Pre DM A1c Elevated to A1c 6.7 now At risk Type 2 Diabetes Discussion on lifestyle management, repeat in 4 months if still A1c 6.5 would dx diabetes and discuss treatment plan.  HLD Controlled on Statin therapy.  Due for routine colon cancer screening.  Last Cologuard negative 10/2019 - Ordered Cologuard today   No orders of the defined types were placed in this encounter.     Follow up plan: Return in about 4 months (around 04/16/2023) for 4 month Pre DM A1c, updates from Ortho/ R Shoulder/ L Groin.  Saralyn Pilar, DO [facility] 12/14/2022, 8:15 AM

## 2023-01-07 LAB — COLOGUARD: COLOGUARD: NEGATIVE

## 2023-02-17 ENCOUNTER — Encounter: Payer: Self-pay | Admitting: Family Medicine

## 2023-02-17 DIAGNOSIS — B370 Candidal stomatitis: Secondary | ICD-10-CM

## 2023-02-17 MED ORDER — MAGIC MOUTHWASH W/LIDOCAINE: ORAL | 1 refills | Status: DC

## 2023-02-25 ENCOUNTER — Other Ambulatory Visit: Payer: Self-pay | Admitting: Family Medicine

## 2023-02-25 ENCOUNTER — Encounter: Payer: Self-pay | Admitting: Family Medicine

## 2023-02-25 DIAGNOSIS — Z8673 Personal history of transient ischemic attack (TIA), and cerebral infarction without residual deficits: Secondary | ICD-10-CM

## 2023-02-26 ENCOUNTER — Other Ambulatory Visit: Payer: Self-pay | Admitting: Family Medicine

## 2023-02-26 DIAGNOSIS — Z8673 Personal history of transient ischemic attack (TIA), and cerebral infarction without residual deficits: Secondary | ICD-10-CM

## 2023-02-26 DIAGNOSIS — M47812 Spondylosis without myelopathy or radiculopathy, cervical region: Secondary | ICD-10-CM

## 2023-02-26 MED ORDER — ATORVASTATIN CALCIUM 40 MG PO TABS: 40.0000 mg | ORAL_TABLET | Freq: Every day | ORAL | 0 refills | Status: DC

## 2023-02-26 NOTE — Telephone Encounter (Signed)
Unable to refill per protocol, Rx request was refilled 02/26/23.E-Prescribing Status: Receipt confirmed by pharmacy (02/26/2023  9:24 AM EDT).  Requested Prescriptions  Pending Prescriptions Disp Refills   atorvastatin (LIPITOR) 40 MG tablet [Pharmacy Med Name: ATORVASTATIN 40 MG TABLET] 90 tablet 0    Sig: TAKE 1 TABLET BY MOUTH EVERYDAY AT BEDTIME     Cardiovascular:  Antilipid - Statins Failed - 02/26/2023  8:50 AM      Failed - Lipid Panel in normal range within the last 12 months    Cholesterol  Date Value Ref Range Status  12/07/2022 133 <200 mg/dL Final   LDL Cholesterol (Calc)  Date Value Ref Range Status  12/07/2022 55 mg/dL (calc) Final    Comment:    Reference range: <100 . Desirable range <100 mg/dL for primary prevention;   <70 mg/dL for patients with CHD or diabetic patients  with > or = 2 CHD risk factors. Marland Kitchen LDL-C is now calculated using the Martin-Hopkins  calculation, which is a validated novel method providing  better accuracy than the Friedewald equation in the  estimation of LDL-C.  Horald Pollen et al. Lenox Ahr. 1610;960(45): 2061-2068  (http://education.[employer].com/faq/FAQ164)    HDL  Date Value Ref Range Status  12/07/2022 58 > OR = 40 mg/dL Final   Triglycerides  Date Value Ref Range Status  12/07/2022 117 <150 mg/dL Final         Passed - Patient is not pregnant      Passed - Valid encounter within last 12 months    Recent Outpatient Visits           2 months ago Annual physical exam   [place] [facility] Smitty Cords, DO   3 months ago Internal hemorrhoids   [place] [facility], Netta Neat, DO   7 months ago Neoplasm of uncertain behavior of skin of nose   [place] [facility] Smitty Cords, DO   8 months ago Numbness and tingling of right thumb   [place] [facility] Smitty Cords, DO   9 months ago Primary  osteoarthritis of first carpometacarpal joint of right hand   [place] [facility], Ocie Bob, MD       Future Appointments             In 1 month Althea Charon, Netta Neat, DO [place] [facility], [facility]

## 2023-02-26 NOTE — Telephone Encounter (Signed)
Requested Prescriptions  Pending Prescriptions Disp Refills   gabapentin (NEURONTIN) 300 MG capsule [Pharmacy Med Name: GABAPENTIN 300 MG CAPSULE] 90 capsule 0    Sig: TAKE 1 CAPSULE (300MG  TOTAL) BY MOUTH AT BEDTIME AS NEEDED     Neurology: Anticonvulsants - gabapentin Passed - 02/26/2023  8:50 AM      Passed - Cr in normal range and within 360 days    Creat  Date Value Ref Range Status  12/07/2022 0.87 0.70 - 1.35 mg/dL Final         Passed - Completed PHQ-2 or PHQ-9 in the last 360 days      Passed - Valid encounter within last 12 months    Recent Outpatient Visits           2 months ago Annual physical exam   [place] [facility] Smitty Cords, DO   3 months ago Internal hemorrhoids   [place] [facility], Netta Neat, DO   7 months ago Neoplasm of uncertain behavior of skin of nose   [place] [facility] Smitty Cords, DO   8 months ago Numbness and tingling of right thumb   [place] [facility] Smitty Cords, DO   9 months ago Primary osteoarthritis of first carpometacarpal joint of right hand   [place] [facility], Ocie Bob, MD       Future Appointments             In 1 month Althea Charon, Netta Neat, DO [place] [facility], [facility]

## 2023-04-13 ENCOUNTER — Ambulatory Visit: Payer: BC Managed Care – PPO | Admitting: Family Medicine

## 2023-04-13 ENCOUNTER — Encounter: Payer: Self-pay | Admitting: Family Medicine

## 2023-04-13 VITALS — BP 138/82 | HR 73 | Ht 74.0 in | Wt 277.0 lb

## 2023-04-13 DIAGNOSIS — M25552 Pain in left hip: Secondary | ICD-10-CM

## 2023-04-13 DIAGNOSIS — Z8673 Personal history of transient ischemic attack (TIA), and cerebral infarction without residual deficits: Secondary | ICD-10-CM | POA: Diagnosis not present

## 2023-04-13 DIAGNOSIS — M47812 Spondylosis without myelopathy or radiculopathy, cervical region: Secondary | ICD-10-CM | POA: Diagnosis not present

## 2023-04-13 DIAGNOSIS — G8929 Other chronic pain: Secondary | ICD-10-CM

## 2023-04-13 DIAGNOSIS — E1169 Type 2 diabetes mellitus with other specified complication: Secondary | ICD-10-CM

## 2023-04-13 DIAGNOSIS — G4733 Obstructive sleep apnea (adult) (pediatric): Secondary | ICD-10-CM

## 2023-04-13 DIAGNOSIS — M1612 Unilateral primary osteoarthritis, left hip: Secondary | ICD-10-CM | POA: Insufficient documentation

## 2023-04-13 LAB — POCT GLYCOSYLATED HEMOGLOBIN (HGB A1C): Hemoglobin A1C: 6.7 % — AB (ref 4.0–5.6)

## 2023-04-13 MED ORDER — GABAPENTIN 300 MG PO CAPS: 600.0000 mg | ORAL_CAPSULE | Freq: Every day | ORAL | 3 refills | Status: DC

## 2023-04-13 MED ORDER — ATORVASTATIN CALCIUM 40 MG PO TABS: 40.0000 mg | ORAL_TABLET | Freq: Every day | ORAL | 3 refills | Status: AC

## 2023-04-13 NOTE — Patient Instructions (Addendum)
Thank you for coming to the office today.  Recent Labs    12/07/22 0822  HGBA1C 6.7*   Re ordered Gabapentin 300mg  x 2 = 600mg  nightly  We will stay tuned for report on your Left Hip.  Refilled Atorvastatin.  Let me know when running low on Valsartan  Call insurance find cost and coverage of the following - check the following: - Drug Tier, Preferred List, On Formulary - All will require a "Prior Authorization" from [place] first, before you can find out the cost - Find out if there is "Step Therapy" (other medicines required before you can try these)  Once you pick the one you want to try, let me know - we can get a sample ready IF we have it in stock. Then try it - and before running out of medicine, contact me back to order your Rx so we have time to get it processed.  3 benefits - 1 significantly reduced A1c sugar, and may be able to reduce or stop metformin in future - 2 reduced appetite and weight loss with good results - 3 cardiovascular risk reduction, less likely to have heart attack/stroke  Weekly injection  Mounjaro Ozempic Trulicity  Daily pill Rybelsus (pill - oral semaglutide or ozempic) - once daily, taken first thing in morning without other meds   Please schedule a Follow-up Appointment to: Return in about 3 months (around 07/13/2023) for 3 month PreDM A1c, update Hip / Ortho, AM apt preferred.  If you have any other questions or concerns, please feel free to call the office or send a message through MyChart. You may also schedule an earlier appointment if necessary.  Additionally, you may be receiving a survey about your experience at our office within a few days to 1 week by e-mail or mail. We value your feedback.  Saralyn Pilar, DO [facility], [place]

## 2023-04-13 NOTE — Progress Notes (Signed)
Subjective:    Patient ID: Randy Watson, male    DOB: 07-Nov-1960, 62 y.o.   MRN: [id number]  Randy Watson is a 62 y.o. male presenting on 04/13/2023 for Medical Management of Chronic Issues   HPI  Left Hip Pain, Chronic Osteoarthritis Lumbar DDD / Chronic Back Pain Followed by [facility], previously w/ Randy [facility], last seen 01/2023 Initially prescribed Gabapentin by Randy Watson before, and dose has been adjusted to 300mg  x 2 = 600mg   NEW Diagnosis Diabetes, Type 2 Previously 12/2022, A1c elevated up to 6.7 this is concerning for new diagnosis of type 2 diabetes. Previously it was < 6.5 in pre diabetes range. Prior results 6.3 to 6.4 Today due for A1c He has improved reduced starch carb pasta bread   History of CVA, L cerebellar w/ PICA He continues on ASA 81, Lipitor 40mg  Now followed by Cardiology at [facility]    Vitamin D Deficiency Vitamin B12 deficiency Improved to Lab 49. On Vita D 1000unit daily Improved, resolved on B12 supplement daily    CHRONIC HTN: Followed by [facility] Current Meds - Valsartan 160mg  daily Reports good compliance, took meds today. Tolerating well, w/o complaints. Denies CP, dyspnea, HA, edema, dizziness / lightheadedness   OSA, on CPAP - Patient reports prior history of dx OSA and on CPAP - Today reports that sleep apnea is well controlled. He uses the CPAP machine every night. Tolerates the machine well, and thinks that sleeps better with it and feels good. No new concerns or symptoms.  Health Maintenance: Declines Flu Vaccine and other vaccines today     04/13/2023    8:14 AM 12/14/2022    8:09 AM 06/23/2022    8:15 AM  Depression screen PHQ 2/9  Decreased Interest 0 0 0  Down, Depressed, Hopeless 0 0 0  PHQ - 2 Score 0 0 0  Altered sleeping   0  Tired, decreased energy   0  Change in appetite   0  Feeling bad or failure about yourself    0  Trouble concentrating   0  Moving slowly or  fidgety/restless   0  Suicidal thoughts   0  PHQ-9 Score   0  Difficult doing work/chores   Not difficult at all    Social History   Tobacco Use   Smoking status: Never   Smokeless tobacco: Former    Types: Chew    Quit date: 01/01/2019   Tobacco comments:    dip  Vaping Use   Vaping status: Never Used  Substance Use Topics   Alcohol use: Yes    Alcohol/week: 1.0 - 4.0 standard drink of alcohol    Types: 1 - 4 Standard drinks or equivalent per week    Comment: Only on the weekends   Drug use: Never    Review of Systems Per HPI unless specifically indicated above     Objective:    BP 138/82   Pulse 73   Ht 6\' 2"  (1.88 m)   Wt 277 lb (125.6 kg)   SpO2 94%   BMI 35.56 kg/m   Wt Readings from Last 3 Encounters:  04/13/23 277 lb (125.6 kg)  12/14/22 281 lb (127.5 kg)  10/30/22 275 lb (124.7 kg)    Physical Exam Vitals and nursing note reviewed.  Constitutional:      General: He is not in acute distress.    Appearance: Normal appearance. He is well-developed. He is not diaphoretic.  Comments: Well-appearing, comfortable, cooperative  HENT:     Head: Normocephalic and atraumatic.  Eyes:     General:        Right eye: No discharge.        Left eye: No discharge.     Conjunctiva/sclera: Conjunctivae normal.  Cardiovascular:     Rate and Rhythm: Normal rate.  Pulmonary:     Effort: Pulmonary effort is normal.  Skin:    General: Skin is warm and dry.     Findings: No erythema or rash.  Neurological:     Mental Status: He is alert and oriented to person, place, and time.  Psychiatric:        Mood and Affect: Mood normal.        Behavior: Behavior normal.        Thought Content: Thought content normal.     Comments: Well groomed, good eye contact, normal speech and thoughts    Results for orders placed or performed in visit on 04/13/23  POCT HgB A1C  Result Value Ref Range   Hemoglobin A1C 6.7 (A) 4.0 - 5.6 %   HbA1c POC (<> result, manual entry)      HbA1c, POC (prediabetic range)     HbA1c, POC (controlled diabetic range)        Assessment & Plan:   Problem List Items Addressed This Visit     Cervical spondylosis   Relevant Medications   gabapentin (NEURONTIN) 300 MG capsule   Chronic left hip pain   Relevant Medications   gabapentin (NEURONTIN) 300 MG capsule   OSA (obstructive sleep apnea)    Well controlled, chronic OSA on CPAP - Good adherence to CPAP nightly - Continue current CPAP therapy, patient seems to be benefiting from therapy       Primary osteoarthritis of left hip   Type 2 diabetes mellitus with other specified complication (HCC) - Primary    Elevated A1c now persistently in range of T2DM new diagnosis A1c 6.7 Complications - Hyperlipidemia, OSA, PAD  Plan:  1. START new Metformin 500 IR TWICE A DAY reviewed dosing benefits side effects, can use temporarily if prefer to advance therapy, AVS list of other diabetes medications given 2. Encourage improved lifestyle - low carb, low sugar diet, reduce portion size, continue improving regular exercise 3. Future can order Glucometer if interested with preferred brand, will need to check w/ insurance 4. Continue ASA, ARB, Statin       Relevant Medications   atorvastatin (LIPITOR) 40 MG tablet   metFORMIN (GLUCOPHAGE) 500 MG tablet   Other Relevant Orders   POCT HgB A1C (Completed)   Other Visit Diagnoses     History of CVA (cerebrovascular accident)       Relevant Medications   atorvastatin (LIPITOR) 40 MG tablet      Chronic Shoulder / Neck Pain - improved  Chronic L Hip Pain, Osteoarthritis advanced Followed by [facility], has upcoming visit tomorrow w/ Hip specialist Already has had injection treatment, mixed results  Continues on Gabapentin nightly 300mg  x 2 = 600mg  But needs new rx, last order was only quantity for 300mg  Advise to continue Gabapentin if helpful at this time. Awaiting further input from Ortho   Meds ordered this  encounter  Medications   gabapentin (NEURONTIN) 300 MG capsule    Sig: Take 2 capsules (600 mg total) by mouth at bedtime.    Dispense:  180 capsule    Refill:  3   atorvastatin (LIPITOR) 40 MG  tablet    Sig: Take 1 tablet (40 mg total) by mouth daily.    Dispense:  90 tablet    Refill:  3    Add refills for future     Follow up plan: Return in about 3 months (around 07/13/2023) for 3 month DM A1c, update Hip / Ortho, AM apt preferred.   Saralyn Pilar, DO [facility] 04/13/2023, 8:12 AM

## 2023-04-14 ENCOUNTER — Encounter: Payer: Self-pay | Admitting: Family Medicine

## 2023-04-14 MED ORDER — METFORMIN HCL 500 MG PO TABS: 500.0000 mg | ORAL_TABLET | Freq: Two times a day (BID) | ORAL | 1 refills | Status: DC

## 2023-04-14 NOTE — Assessment & Plan Note (Signed)
Elevated A1c now persistently in range of T2DM new diagnosis A1c 6.7 Complications - Hyperlipidemia, OSA, PAD  Plan:  1. START new Metformin 500 IR TWICE A DAY reviewed dosing benefits side effects, can use temporarily if prefer to advance therapy, AVS list of other diabetes medications given 2. Encourage improved lifestyle - low carb, low sugar diet, reduce portion size, continue improving regular exercise 3. Future can order Glucometer if interested with preferred brand, will need to check w/ insurance 4. Continue ASA, ARB, Statin

## 2023-04-14 NOTE — Assessment & Plan Note (Signed)
Well controlled, chronic OSA on CPAP - Good adherence to CPAP nightly - Continue current CPAP therapy, patient seems to be benefiting from therapy  

## 2023-04-16 ENCOUNTER — Ambulatory Visit: Payer: BC Managed Care – PPO | Admitting: Family Medicine

## 2023-07-13 ENCOUNTER — Ambulatory Visit: Payer: BC Managed Care – PPO | Admitting: Family Medicine

## 2023-07-21 ENCOUNTER — Ambulatory Visit: Payer: BC Managed Care – PPO | Admitting: Family Medicine

## 2023-07-21 ENCOUNTER — Other Ambulatory Visit: Payer: Self-pay | Admitting: Family Medicine

## 2023-07-21 VITALS — BP 130/76 | HR 82 | Ht 74.0 in | Wt 272.0 lb

## 2023-07-21 DIAGNOSIS — E1169 Type 2 diabetes mellitus with other specified complication: Secondary | ICD-10-CM

## 2023-07-21 DIAGNOSIS — I6930 Unspecified sequelae of cerebral infarction: Secondary | ICD-10-CM | POA: Diagnosis not present

## 2023-07-21 DIAGNOSIS — Z0000 Encounter for general adult medical examination without abnormal findings: Secondary | ICD-10-CM

## 2023-07-21 DIAGNOSIS — Z125 Encounter for screening for malignant neoplasm of prostate: Secondary | ICD-10-CM

## 2023-07-21 DIAGNOSIS — E782 Mixed hyperlipidemia: Secondary | ICD-10-CM

## 2023-07-21 DIAGNOSIS — E538 Deficiency of other specified B group vitamins: Secondary | ICD-10-CM

## 2023-07-21 LAB — POCT GLYCOSYLATED HEMOGLOBIN (HGB A1C): Hemoglobin A1C: 6.2 % — AB (ref 4.0–5.6)

## 2023-07-21 MED ORDER — METFORMIN HCL 500 MG PO TABS: 500.0000 mg | ORAL_TABLET | Freq: Two times a day (BID) | ORAL | 3 refills | Status: DC

## 2023-07-21 NOTE — Patient Instructions (Addendum)
Thank you for coming to the office today.  Recent Labs    12/07/22 0822 04/13/23 0851 07/21/23 0829  HGBA1C 6.7* 6.7* 6.2*    Refilled Metformin keep on twice a day  Recommend Diabetic Eye Exam once per year. Schedule when you are ready, and have them send [place] a copy of the report.  [facility]   Address: [address] [place], [place] [address] Phone: [phone]  Website: [email or web address]  DUE for FASTING BLOOD WORK (no food or drink after midnight before the lab appointment, only water or coffee without cream/sugar on the morning of)  SCHEDULE "Lab Only" visit in the morning at the clinic for lab draw in 5 MONTHS   - Make sure Lab Only appointment is at about 1 week before your next appointment, so that results will be available  For Lab Results, once available within 2-3 days of blood draw, you can can log in to MyChart online to view your results and a brief explanation. Also, we can discuss results at next follow-up visit.    Please schedule a Follow-up Appointment to: Return in about 5 months (around 12/19/2023) for 5 month fasting lab > 1 week later Annual Physical.  If you have any other questions or concerns, please feel free to call the office or send a message through MyChart. You may also schedule an earlier appointment if necessary.  Additionally, you may be receiving a survey about your experience at our office within a few days to 1 week by e-mail or mail. We value your feedback.  Saralyn Pilar, DO [facility], [place]

## 2023-07-21 NOTE — Assessment & Plan Note (Signed)
Improved A1c to 6.2 with lifestyle change and metformin Complications - Hyperlipidemia, OSA, PAD  Plan:  Continue Metformin 500 IR TWICE A DAY 2. Encourage improved lifestyle - low carb, low sugar diet, reduce portion size, continue improving regular exercise 3. Continue ASA, ARB, Statin 4. DM Foot, DM Eye, Urine Microalbumin

## 2023-07-21 NOTE — Progress Notes (Signed)
Subjective:    Patient ID: Randy Watson, male    DOB: Dec 27, 1960, 62 y.o.   MRN: [id number]  Randy Watson is a 62 y.o. male presenting on 07/21/2023 for Medical Management of Chronic Issues (Recheck A1C)   HPI  Discussed the use of AI scribe software for clinical note transcription with the patient, who gave verbal consent to proceed.  Follow-up Diabetes, Type 2  The patient, diagnosed recently diabetes in 04/2023, has been managing his condition with metformin and lifestyle modifications. He reports no issues with the medication and has seen a decrease in his blood sugar levels from 6.7 to 6.2 since his last visit in September. The patient has been adhering to the prescribed dosage of metformin twice daily and has requested a refill.  In addition to diabetes, the patient is also on gabapentin, the status of which is reported to be satisfactory with refills available until September of the following year. The patient also mentions being on a blood pressure medication, managed by his cardiologist.  He reports no issues with his feet, no numbness, tingling, burning, or stinging sensations.  The patient's weight has been decreasing, with a loss of about 10 pounds since May. The patient's blood pressure was reported to be within normal limits during the visit.           07/21/2023    8:13 AM 04/13/2023    8:14 AM 12/14/2022    8:09 AM  Depression screen PHQ 2/9  Decreased Interest 0 0 0  Down, Depressed, Hopeless 0 0 0  PHQ - 2 Score 0 0 0       07/21/2023    8:13 AM 04/13/2023    8:14 AM 12/14/2022    8:09 AM 06/23/2022    8:15 AM  GAD 7 : Generalized Anxiety Score  Nervous, Anxious, on Edge 0 0 0 0  Control/stop worrying 0 0 0 0  Worry too much - different things 0 0 0 0  Trouble relaxing 0 0 0 0  Restless 0 0 0 0  Easily annoyed or irritable 0 0 0 0  Afraid - awful might happen 0 0 0 0  Total GAD 7 Score 0 0 0 0  Anxiety Difficulty    Not difficult at all     Social History   Tobacco Use   Smoking status: Never   Smokeless tobacco: Former    Types: Chew    Quit date: 01/01/2019   Tobacco comments:    dip  Vaping Use   Vaping status: Never Used  Substance Use Topics   Alcohol use: Yes    Alcohol/week: 1.0 - 4.0 standard drink of alcohol    Types: 1 - 4 Standard drinks or equivalent per week    Comment: Only on the weekends   Drug use: Never    Review of Systems  Constitutional:  Negative for activity change, appetite change, chills, diaphoresis, fatigue and fever.  HENT:  Negative for congestion and hearing loss.   Eyes:  Negative for visual disturbance.  Respiratory:  Negative for cough, chest tightness, shortness of breath and wheezing.   Cardiovascular:  Negative for chest pain, palpitations and leg swelling.  Gastrointestinal:  Negative for abdominal pain, constipation, diarrhea, nausea and vomiting.  Genitourinary:  Negative for dysuria, frequency and hematuria.  Musculoskeletal:  Negative for arthralgias and neck pain.  Skin:  Negative for rash.  Neurological:  Negative for dizziness, weakness, light-headedness, numbness and headaches.  Hematological:  Negative for adenopathy.  Psychiatric/Behavioral:  Negative for behavioral problems, dysphoric mood and sleep disturbance.    Per HPI unless specifically indicated above     Objective:    BP 130/76   Pulse 82   Ht 6\' 2"  (1.88 m)   Wt 272 lb (123.4 kg)   BMI 34.92 kg/m   Wt Readings from Last 3 Encounters:  07/21/23 272 lb (123.4 kg)  04/13/23 277 lb (125.6 kg)  12/14/22 281 lb (127.5 kg)    Physical Exam Vitals and nursing note reviewed.  Constitutional:      General: He is not in acute distress.    Appearance: He is well-developed. He is not diaphoretic.     Comments: Well-appearing, comfortable, cooperative  HENT:     Head: Normocephalic and atraumatic.  Eyes:     General:        Right eye: No discharge.        Left eye: No discharge.      Conjunctiva/sclera: Conjunctivae normal.     Pupils: Pupils are equal, round, and reactive to light.  Neck:     Thyroid: No thyromegaly.     Vascular: No carotid bruit.  Cardiovascular:     Rate and Rhythm: Normal rate and regular rhythm.     Pulses: Normal pulses.     Heart sounds: Normal heart sounds. No murmur heard. Pulmonary:     Effort: Pulmonary effort is normal. No respiratory distress.     Breath sounds: Normal breath sounds. No wheezing or rales.  Abdominal:     General: Bowel sounds are normal. There is no distension.     Palpations: Abdomen is soft. There is no mass.     Tenderness: There is no abdominal tenderness.  Musculoskeletal:        General: No tenderness. Normal range of motion.     Cervical back: Normal range of motion and neck supple.     Right lower leg: No edema.     Left lower leg: No edema.     Comments: Upper / Lower Extremities: - Normal muscle tone, strength bilateral upper extremities 5/5, lower extremities 5/5  Lymphadenopathy:     Cervical: No cervical adenopathy.  Skin:    General: Skin is warm and dry.     Findings: No erythema or rash.  Neurological:     Mental Status: He is alert and oriented to person, place, and time.     Comments: Distal sensation intact to light touch all extremities  Psychiatric:        Mood and Affect: Mood normal.        Behavior: Behavior normal.        Thought Content: Thought content normal.     Comments: Well groomed, good eye contact, normal speech and thoughts     Diabetic Foot Exam - Simple   Simple Foot Form Diabetic Foot exam was performed with the following findings: Yes 07/21/2023  8:35 AM  Visual Inspection No deformities, no ulcerations, no other skin breakdown bilaterally: Yes Sensation Testing Intact to touch and monofilament testing bilaterally: Yes Pulse Check Posterior Tibialis and Dorsalis pulse intact bilaterally: Yes Comments      Results for orders placed or performed in visit on  07/21/23  POCT HgB A1C  Result Value Ref Range   Hemoglobin A1C 6.2 (A) 4.0 - 5.6 %   HbA1c POC (<> result, manual entry)     HbA1c, POC (prediabetic range)     HbA1c, POC (controlled diabetic range)  Assessment & Plan:   Problem List Items Addressed This Visit     History of cerebrovascular accident (CVA) with residual deficit   Type 2 diabetes mellitus with other specified complication (HCC) - Primary    Improved A1c to 6.2 with lifestyle change and metformin Complications - Hyperlipidemia, OSA, PAD  Plan:  Continue Metformin 500 IR TWICE A DAY 2. Encourage improved lifestyle - low carb, low sugar diet, reduce portion size, continue improving regular exercise 3. Continue ASA, ARB, Statin 4. DM Foot, DM Eye, Urine Microalbumin       Relevant Medications   metFORMIN (GLUCOPHAGE) 500 MG tablet   Other Relevant Orders   POCT HgB A1C (Completed)   Urine Microalbumin w/creat. ratio        Orders Placed This Encounter  Procedures   Urine Microalbumin w/creat. ratio   POCT HgB A1C    Meds ordered this encounter  Medications   metFORMIN (GLUCOPHAGE) 500 MG tablet    Sig: Take 1 tablet (500 mg total) by mouth 2 (two) times daily with a meal.    Dispense:  180 tablet    Refill:  3    Follow up plan: Return in about 5 months (around 12/19/2023) for 5 month fasting lab > 1 week later Annual Physical.  Future labs ordered for 12/20/23   Saralyn Pilar, DO [facility] 07/21/2023, 8:30 AM

## 2023-07-22 LAB — MICROALBUMIN / CREATININE URINE RATIO
Creatinine, Urine: 181 mg/dL (ref 20–320)
Microalb Creat Ratio: 3 mg/g{creat} (ref <30)
Microalb, Ur: 0.6 mg/dL

## 2023-11-26 ENCOUNTER — Emergency Department

## 2023-11-26 ENCOUNTER — Other Ambulatory Visit: Payer: Self-pay

## 2023-11-26 ENCOUNTER — Emergency Department
Admission: EM | Admit: 2023-11-26 | Discharge: 2023-11-26 | Disposition: A | Discharge status: Home / Self Care | Attending: Emergency Medicine | Admitting: Emergency Medicine

## 2023-11-26 DIAGNOSIS — E119 Type 2 diabetes mellitus without complications: Secondary | ICD-10-CM | POA: Diagnosis not present

## 2023-11-26 DIAGNOSIS — R1032 Left lower quadrant pain: Secondary | ICD-10-CM | POA: Diagnosis present

## 2023-11-26 DIAGNOSIS — Z8673 Personal history of transient ischemic attack (TIA), and cerebral infarction without residual deficits: Secondary | ICD-10-CM | POA: Diagnosis not present

## 2023-11-26 DIAGNOSIS — R109 Unspecified abdominal pain: Secondary | ICD-10-CM

## 2023-11-26 DIAGNOSIS — N132 Hydronephrosis with renal and ureteral calculous obstruction: Secondary | ICD-10-CM | POA: Diagnosis not present

## 2023-11-26 DIAGNOSIS — N200 Calculus of kidney: Secondary | ICD-10-CM

## 2023-11-26 LAB — URINALYSIS, ROUTINE W REFLEX MICROSCOPIC
Bilirubin Urine: NEGATIVE
Glucose, UA: NEGATIVE mg/dL
Ketones, ur: NEGATIVE mg/dL
Leukocytes,Ua: NEGATIVE
Nitrite: NEGATIVE
Protein, ur: NEGATIVE mg/dL
RBC / HPF: >50 RBC/hpf (ref 0–5)
Specific Gravity, Urine: 1.019 (ref 1.005–1.030)
Squamous Epithelial / HPF: 0 /HPF (ref 0–5)
pH: 5 (ref 5.0–8.0)

## 2023-11-26 LAB — COMPREHENSIVE METABOLIC PANEL WITH GFR
ALT: 40 U/L (ref 0–44)
AST: 26 U/L (ref 15–41)
Albumin: 4 g/dL (ref 3.5–5.0)
Alkaline Phosphatase: 90 U/L (ref 38–126)
Anion gap: 8 (ref 5–15)
BUN: 17 mg/dL (ref 8–23)
CO2: 24 mmol/L (ref 22–32)
Calcium: 9.3 mg/dL (ref 8.9–10.3)
Chloride: 103 mmol/L (ref 98–111)
Creatinine, Ser: 1 mg/dL (ref 0.61–1.24)
GFR, Estimated: >60 mL/min (ref >60)
Glucose, Bld: 118 mg/dL — ABNORMAL HIGH (ref 70–99)
Potassium: 3.9 mmol/L (ref 3.5–5.1)
Sodium: 135 mmol/L (ref 135–145)
Total Bilirubin: 0.9 mg/dL (ref 0.0–1.2)
Total Protein: 7 g/dL (ref 6.5–8.1)

## 2023-11-26 LAB — CBC
HCT: 41.7 % (ref 39.0–52.0)
Hemoglobin: 14.3 g/dL (ref 13.0–17.0)
MCH: 32 pg (ref 26.0–34.0)
MCHC: 34.3 g/dL (ref 30.0–36.0)
MCV: 93.3 fL (ref 80.0–100.0)
Platelets: 256 10*3/uL (ref 150–400)
RBC: 4.47 MIL/uL (ref 4.22–5.81)
RDW: 12.5 % (ref 11.5–15.5)
WBC: 13.8 10*3/uL — ABNORMAL HIGH (ref 4.0–10.5)
nRBC: 0 % (ref 0.0–0.2)

## 2023-11-26 MED ORDER — OXYCODONE-ACETAMINOPHEN 5-325 MG PO TABS: 1.0000 | ORAL_TABLET | ORAL | 0 refills | Status: DC | PRN

## 2023-11-26 MED ORDER — KETOROLAC TROMETHAMINE 10 MG PO TABS: 10.0000 mg | ORAL_TABLET | Freq: Four times a day (QID) | ORAL | 0 refills | Status: AC | PRN

## 2023-11-26 MED ORDER — LACTATED RINGERS IV BOLUS
1000.0000 mL | Freq: Once | INTRAVENOUS | Status: AC
  Administered 2023-11-26: 1000 mL via INTRAVENOUS

## 2023-11-26 MED ORDER — KETOROLAC TROMETHAMINE 30 MG/ML IJ SOLN
30.0000 mg | Freq: Once | INTRAMUSCULAR | Status: AC
  Administered 2023-11-26: 30 mg via INTRAVENOUS
  Filled 2023-11-26: qty 1

## 2023-11-26 MED ORDER — MORPHINE SULFATE (PF) 4 MG/ML IV SOLN
4.0000 mg | Freq: Once | INTRAVENOUS | Status: AC
  Administered 2023-11-26: 4 mg via INTRAVENOUS
  Filled 2023-11-26: qty 1

## 2023-11-26 MED ORDER — TAMSULOSIN HCL 0.4 MG PO CAPS: 0.4000 mg | ORAL_CAPSULE | Freq: Every day | ORAL | 0 refills | Status: AC

## 2023-11-26 NOTE — ED Provider Notes (Signed)
 [facility] Provider Note   Event Date/Time   First MD Initiated Contact with Patient 11/26/23 1527     (approximate) History  Flank Pain  HPI Randy Watson is a 63 y.o. male with a stated past medical history of CVA, obesity, type 2 diabetes, and previous kidney stones who presents complaining of left flank pain since approximately 11 AM this morning.  Patient also complains of testicular pain and difficulty with urination.  Patient states this pain is now in the left lower quadrant of his abdomen that radiates around his back as well as into his groin.  Patient states this pain is similar to previous kidney stones he has had in the past ROS: Patient currently denies any vision changes, tinnitus, difficulty speaking, facial droop, sore throat, chest pain, shortness of breath,  nausea/vomiting/diarrhea, dysuria, or weakness/numbness/paresthesias in any extremity   Physical Exam  Triage Vital Signs: ED Triage Vitals  Encounter Vitals Group     BP 11/26/23 1421 (!) 147/83     Systolic BP Percentile --      Diastolic BP Percentile --      Pulse Rate 11/26/23 1421 79     Resp 11/26/23 1421 19     Temp 11/26/23 1421 97.6 F (36.4 C)     Temp Source 11/26/23 1421 Oral     SpO2 11/26/23 1421 98 %     Weight 11/26/23 1430 258 lb (117 kg)     Height 11/26/23 1430 6\' 2"  (1.88 m)     Head Circumference --      Peak Flow --      Pain Score 11/26/23 1430 4     Pain Loc --      Pain Education --      Exclude from Growth Chart --    Most recent vital signs: Vitals:   11/26/23 1421  BP: (!) 147/83  Pulse: 79  Resp: 19  Temp: 97.6 F (36.4 C)  SpO2: 98%   General: Awake, oriented x4. CV:  Good peripheral perfusion.  Resp:  Normal effort.  Abd:  No distention.  Other:  Middle-aged obese Caucasian male resting comfortably in no acute distress ED Results / Procedures / Treatments  Labs (all labs ordered are listed, but only abnormal results are  displayed) Labs Reviewed  COMPREHENSIVE METABOLIC PANEL WITH GFR - Abnormal; Notable for the following components:      Result Value   Glucose, Bld 118 (*)    All other components within normal limits  CBC - Abnormal; Notable for the following components:   WBC 13.8 (*)    All other components within normal limits  URINALYSIS, ROUTINE W REFLEX MICROSCOPIC - Abnormal; Notable for the following components:   Color, Urine YELLOW (*)    APPearance HAZY (*)    Hgb urine dipstick LARGE (*)    Bacteria, UA RARE (*)    All other components within normal limits   RADIOLOGY ED MD interpretation: CT of the abdomen and pelvis without IV contrast independently interpreted and shows a 2 mm left UVJ calculus causing mild proximal obstructive uropathy with an additional 1 to 2 mm calculus in the left kidney interpolar region -Agree with radiology assessment Official radiology report(s): CT Renal Stone Study Result Date: 11/26/2023 CLINICAL DATA:  Abdominal/flank pain, stone suspected. EXAM: CT ABDOMEN AND PELVIS WITHOUT CONTRAST TECHNIQUE: Multidetector CT imaging of the abdomen and pelvis was performed following the standard protocol without IV contrast. RADIATION DOSE REDUCTION: This exam was  performed according to the departmental dose-optimization program which includes automated exposure control, adjustment of the mA and/or kV according to patient size and/or use of iterative reconstruction technique. COMPARISON:  CT scan renal stone protocol from 04/03/2021. FINDINGS: Lower chest: Redemonstration of sub 5 mm opacity along the right major fissure, likely intra fissural lymph node. The lung bases are otherwise clear. No pleural effusion. The heart is normal in size. No pericardial effusion. Hepatobiliary: The liver is normal in size. Non-cirrhotic configuration. No suspicious mass. No intrahepatic or extrahepatic bile duct dilation. No calcified gallstones. Normal gallbladder wall thickness. No  pericholecystic inflammatory changes. Pancreas: Unremarkable. No pancreatic ductal dilatation or surrounding inflammatory changes. Spleen: Within normal limits. No focal lesion. Adrenals/Urinary Tract: Adrenal glands are unremarkable. No suspicious renal mass within the limitations of this unenhanced exam. There is a 2 mm calculus in the left vesico ureteric junction causing mild proximal hydronephrosis and hydroureter. There is an additional 1 121.5 mm calculus in the left kidney interpolar region. No other nephroureterolithiasis on either side. No right obstructive uropathy. Left UVJ calculus, as described above. Urinary bladder is otherwise grossly unremarkable. Stomach/Bowel: There is tiny sliding hiatal hernia. There is also small diverticulum arising from the second part of duodenum. No disproportionate dilation of the small or large bowel loops. No evidence of abnormal bowel wall thickening or inflammatory changes. The appendix is unremarkable. There are multiple diverticula mainly in the left hemi colon, without imaging signs of diverticulitis. Vascular/Lymphatic: No ascites or pneumoperitoneum. No abdominal or pelvic lymphadenopathy, by size criteria. No aneurysmal dilation of the major abdominal arteries. There are mild peripheral atherosclerotic vascular calcifications of the aorta and its major branches. Reproductive: Normal size prostate. Symmetric seminal vesicles. Other: The visualized soft tissues and abdominal wall are unremarkable. Musculoskeletal: No suspicious osseous lesions. There are mild - moderate multilevel degenerative changes in the visualized spine. IMPRESSION: 1. There is a 2 mm left vesico ureteric junction calculus causing mild proximal obstructive uropathy. There is an additional 1-2 mm calculus in the left kidney interpolar region. No other nephroureterolithiasis on either side. No right obstructive uropathy. 2. Multiple other nonacute observations, as described above. Aortic  Atherosclerosis (ICD10-I70.0). Electronically Signed   By: Beula Brunswick M.D.   On: 11/26/2023 15:56   PROCEDURES: Critical Care performed: No Procedures MEDICATIONS ORDERED IN ED: Medications  morphine  (PF) 4 MG/ML injection 4 mg (4 mg Intravenous Given 11/26/23 1543)  ketorolac  (TORADOL ) 30 MG/ML injection 30 mg (30 mg Intravenous Given 11/26/23 1543)  lactated ringers  bolus 1,000 mL (1,000 mLs Intravenous New Bag/Given 11/26/23 1543)   IMPRESSION / MDM / ASSESSMENT AND PLAN / ED COURSE  I reviewed the triage vital signs and the nursing notes.                             The patient is on the cardiac monitor to evaluate for evidence of arrhythmia and/or significant heart rate changes. Patient's presentation is most consistent with acute presentation with potential threat to life or bodily function. Patient presents for severe flank pain. Presentation most consistent with Renal Colic from a Non-infected Kidney Stone. Given History and Exam I have lower suspicion for atypical appendicitis, genital torsion, acute cholecystitis, AAA, Aortic Dissection, Serious Bacterial Illness or other emergent intraabdominal pathology.  Workup: CBC, BMP, CT Abd/Pelvis noncontrast, UA, reassess Findings: 2 mm left UVJ stone Reassesment: Patient tolerating PO and pain controlled Disposition:  Discharge. Strict return precautions for infected stone or  PO intolerance discussed.   FINAL CLINICAL IMPRESSION(S) / ED DIAGNOSES   Final diagnoses:  Left flank pain  Kidney stone on left side   Rx / DC Orders   ED Discharge Orders          Ordered    tamsulosin  (FLOMAX ) 0.4 MG CAPS capsule  Daily        11/26/23 1624    ketorolac  (TORADOL ) 10 MG tablet  Every 6 hours PRN       Note to Pharmacy: Patient given an IM/IV loading dose in emergency department   11/26/23 1624    oxyCODONE -acetaminophen  (PERCOCET) 5-325 MG tablet  Every 4 hours PRN        11/26/23 1624           Note:  This document was  prepared using Dragon voice recognition software and may include unintentional dictation errors.   Joshue Badal K, MD 11/26/23 [phone]

## 2023-11-26 NOTE — ED Triage Notes (Signed)
 Patient started having left abdominal pain around 11 am this morning. Pt states that the pain radiates from left side of belly to his back. Pt also complains of testicular pain, and inability to urinate. Pt states that he took 2 tylenol  for pain around 1145, with some relief. Pt states that he has had a kidney stone before, and it feels similar. PT is alert and oriented x4, with no signs of acute distress at this time.

## 2023-12-09 ENCOUNTER — Other Ambulatory Visit: Payer: Self-pay | Admitting: Nurse Practitioner

## 2023-12-09 DIAGNOSIS — Z8249 Family history of ischemic heart disease and other diseases of the circulatory system: Secondary | ICD-10-CM

## 2023-12-09 DIAGNOSIS — E119 Type 2 diabetes mellitus without complications: Secondary | ICD-10-CM

## 2023-12-09 DIAGNOSIS — Z8673 Personal history of transient ischemic attack (TIA), and cerebral infarction without residual deficits: Secondary | ICD-10-CM

## 2023-12-09 DIAGNOSIS — E782 Mixed hyperlipidemia: Secondary | ICD-10-CM

## 2023-12-09 DIAGNOSIS — G4733 Obstructive sleep apnea (adult) (pediatric): Secondary | ICD-10-CM

## 2023-12-09 DIAGNOSIS — I10 Essential (primary) hypertension: Secondary | ICD-10-CM

## 2023-12-20 ENCOUNTER — Encounter: Payer: Self-pay | Admitting: Family Medicine

## 2023-12-20 ENCOUNTER — Other Ambulatory Visit: Payer: Self-pay

## 2023-12-20 DIAGNOSIS — E1169 Type 2 diabetes mellitus with other specified complication: Secondary | ICD-10-CM

## 2023-12-20 DIAGNOSIS — I6930 Unspecified sequelae of cerebral infarction: Secondary | ICD-10-CM

## 2023-12-20 DIAGNOSIS — E782 Mixed hyperlipidemia: Secondary | ICD-10-CM

## 2023-12-20 DIAGNOSIS — E538 Deficiency of other specified B group vitamins: Secondary | ICD-10-CM

## 2023-12-20 DIAGNOSIS — Z0000 Encounter for general adult medical examination without abnormal findings: Secondary | ICD-10-CM

## 2023-12-20 DIAGNOSIS — Z125 Encounter for screening for malignant neoplasm of prostate: Secondary | ICD-10-CM

## 2023-12-21 LAB — COMPLETE METABOLIC PANEL WITHOUT GFR
AG Ratio: 1.8 (calc) (ref 1.0–2.5)
ALT: 32 U/L (ref 9–46)
AST: 21 U/L (ref 10–35)
Albumin: 4.2 g/dL (ref 3.6–5.1)
Alkaline phosphatase (APISO): 97 U/L (ref 35–144)
BUN: 16 mg/dL (ref 7–25)
CO2: 30 mmol/L (ref 20–32)
Calcium: 9.6 mg/dL (ref 8.6–10.3)
Chloride: 106 mmol/L (ref 98–110)
Creat: 0.87 mg/dL (ref 0.70–1.35)
Globulin: 2.3 g/dL (ref 1.9–3.7)
Glucose, Bld: 137 mg/dL — ABNORMAL HIGH (ref 65–99)
Potassium: 4.7 mmol/L (ref 3.5–5.3)
Sodium: 141 mmol/L (ref 135–146)
Total Bilirubin: 0.5 mg/dL (ref 0.2–1.2)
Total Protein: 6.5 g/dL (ref 6.1–8.1)

## 2023-12-21 LAB — VITAMIN B12: Vitamin B-12: >2000 pg/mL — ABNORMAL HIGH (ref 200–1100)

## 2023-12-21 LAB — CBC WITH DIFFERENTIAL/PLATELET
Absolute Lymphocytes: 1970 {cells}/uL (ref 850–3900)
Absolute Monocytes: 509 {cells}/uL (ref 200–950)
Basophils Absolute: 47 {cells}/uL (ref 0–200)
Basophils Relative: 0.7 %
Eosinophils Absolute: 147 {cells}/uL (ref 15–500)
Eosinophils Relative: 2.2 %
HCT: 39.9 % (ref 38.5–50.0)
Hemoglobin: 13.2 g/dL (ref 13.2–17.1)
MCH: 31.5 pg (ref 27.0–33.0)
MCHC: 33.1 g/dL (ref 32.0–36.0)
MCV: 95.2 fL (ref 80.0–100.0)
MPV: 9.9 fL (ref 7.5–12.5)
Monocytes Relative: 7.6 %
Neutro Abs: 4027 {cells}/uL (ref 1500–7800)
Neutrophils Relative %: 60.1 %
Platelets: 251 10*3/uL (ref 140–400)
RBC: 4.19 10*6/uL — ABNORMAL LOW (ref 4.20–5.80)
RDW: 12.3 % (ref 11.0–15.0)
Total Lymphocyte: 29.4 %
WBC: 6.7 10*3/uL (ref 3.8–10.8)

## 2023-12-21 LAB — LIPID PANEL
Cholesterol: 112 mg/dL (ref <200)
HDL: 50 mg/dL (ref >=40)
LDL Cholesterol (Calc): 42 mg/dL
Non-HDL Cholesterol (Calc): 62 mg/dL (ref <130)
Total CHOL/HDL Ratio: 2.2 (calc) (ref <5.0)
Triglycerides: 117 mg/dL (ref <150)

## 2023-12-21 LAB — PSA: PSA: 0.74 ng/mL (ref <=4.00)

## 2023-12-21 LAB — HEMOGLOBIN A1C
Hgb A1c MFr Bld: 6.2 % — ABNORMAL HIGH (ref <5.7)
Mean Plasma Glucose: 131 mg/dL
eAG (mmol/L): 7.3 mmol/L

## 2023-12-21 LAB — TSH: TSH: 0.53 m[IU]/L (ref 0.40–4.50)

## 2023-12-22 ENCOUNTER — Other Ambulatory Visit: Payer: Self-pay

## 2023-12-22 ENCOUNTER — Ambulatory Visit
Admission: RE | Admit: 2023-12-22 | Discharge: 2023-12-22 | Disposition: A | Discharge status: Home / Self Care | Payer: Self-pay | Source: Ambulatory Visit | Attending: Nurse Practitioner | Admitting: Nurse Practitioner

## 2023-12-22 DIAGNOSIS — G4733 Obstructive sleep apnea (adult) (pediatric): Secondary | ICD-10-CM | POA: Insufficient documentation

## 2023-12-22 DIAGNOSIS — Z8249 Family history of ischemic heart disease and other diseases of the circulatory system: Secondary | ICD-10-CM | POA: Insufficient documentation

## 2023-12-22 DIAGNOSIS — I10 Essential (primary) hypertension: Secondary | ICD-10-CM | POA: Insufficient documentation

## 2023-12-22 DIAGNOSIS — E782 Mixed hyperlipidemia: Secondary | ICD-10-CM | POA: Insufficient documentation

## 2023-12-22 DIAGNOSIS — Z794 Long term (current) use of insulin: Secondary | ICD-10-CM | POA: Insufficient documentation

## 2023-12-22 DIAGNOSIS — E119 Type 2 diabetes mellitus without complications: Secondary | ICD-10-CM | POA: Insufficient documentation

## 2023-12-22 DIAGNOSIS — N200 Calculus of kidney: Secondary | ICD-10-CM

## 2023-12-22 DIAGNOSIS — Z8673 Personal history of transient ischemic attack (TIA), and cerebral infarction without residual deficits: Secondary | ICD-10-CM | POA: Insufficient documentation

## 2023-12-23 ENCOUNTER — Ambulatory Visit
Admission: RE | Admit: 2023-12-23 | Discharge: 2023-12-23 | Disposition: A | Discharge status: Home / Self Care | Attending: Urology | Admitting: Urology

## 2023-12-23 ENCOUNTER — Ambulatory Visit: Admitting: Physician Assistant

## 2023-12-23 ENCOUNTER — Ambulatory Visit
Admission: RE | Admit: 2023-12-23 | Discharge: 2023-12-23 | Disposition: A | Discharge status: Home / Self Care | Source: Ambulatory Visit | Attending: Urology | Admitting: Urology

## 2023-12-23 VITALS — BP 114/73 | HR 102 | Ht 74.0 in | Wt 255.0 lb

## 2023-12-23 DIAGNOSIS — N201 Calculus of ureter: Secondary | ICD-10-CM | POA: Diagnosis not present

## 2023-12-23 DIAGNOSIS — N486 Induration penis plastica: Secondary | ICD-10-CM | POA: Diagnosis not present

## 2023-12-23 DIAGNOSIS — N200 Calculus of kidney: Secondary | ICD-10-CM

## 2023-12-23 MED ORDER — OXYCODONE-ACETAMINOPHEN 5-325 MG PO TABS: 1.0000 | ORAL_TABLET | Freq: Four times a day (QID) | ORAL | 0 refills | Status: DC | PRN

## 2023-12-23 MED ORDER — TADALAFIL 5 MG PO TABS: 5.0000 mg | ORAL_TABLET | Freq: Every day | ORAL | 2 refills | Status: DC

## 2023-12-23 MED ORDER — ONDANSETRON 4 MG PO TBDP: 4.0000 mg | ORAL_TABLET | Freq: Three times a day (TID) | ORAL | 0 refills | Status: AC | PRN

## 2023-12-23 MED ORDER — TAMSULOSIN HCL 0.4 MG PO CAPS: 0.4000 mg | ORAL_CAPSULE | Freq: Every day | ORAL | 0 refills | Status: AC

## 2023-12-23 NOTE — Patient Instructions (Signed)
 For the next 3 weeks, please do the following: -Stay well hydrated -Take Flomax  0.4mg  daily -Treat any pain with Advil (ibuprofen), Tylenol  (acetaminophen ), or Percocet (oxycodone -acetaminophen ). NOTE: DO NOT TAKE TYLENOL  AND PERCOCET AT THE SAME TIME, THOUGH YOU MAY ALTERNATE THESE EVERY 6 HOURS. -Treat any nausea with Zofran   I will plan to see you back in clinic in 3 weeks to see if you have passed your stone.  Please call our office immediately (we are open 8a-5p Monday-Friday) or go to the Emergency Department if you develop any of the following: -Fever/chills -Nausea and/or vomiting uncontrollable with Zofran  -Pain uncontrollable with Percocet

## 2023-12-23 NOTE — Progress Notes (Unsigned)
 12/23/2023 2:50 PM   Randy Watson 1961/04/26 [id number]  CC: Chief Complaint  Patient presents with   Follow-up   HPI: Randy Watson is a 63 y.o. male with PMH CVA, DM2, and nephrolithiasis who was recently found to have a 2 mm left UVJ stone who presents today for evaluation of stone symptoms.   He was seen in the emergency department on 11/26/2023 with reports of left flank and left testicular pain.  CT stone study showed a 2 mm left UVJ stone and a 1-2 mm nonobstructing left renal stone.  UA was notable for mild pyuria, microscopic hematuria, and rare bacteriuria.  He was discharged with Flomax , Percocet, and Toradol .  Today he reports his symptoms resolved within several days of his ED visit, but returned within the past several days.  He also had an episode of hematospermia.  He never saw stone pass.  He has no pain in the office today, but his pain can reach 7/10 in intensity.  He is also having rare nausea.  Additionally, he describes a couple of months of penile curvature with erections that is new for him.  His erections can sometimes be painful, but his curvature does not interfere with penetrative sex.  No radiopaque stone seen on KUB today.  In-office UA today positive for trace protein and trace ketones; urine microscopy with moderate bacteria.Randy Watson  PMH: Past Medical History:  Diagnosis Date   Allergy    Anal fissure    Constipation    GERD (gastroesophageal reflux disease)    History of chicken pox    Obesity    Stroke (HCC) 12/2018    Surgical History: Past Surgical History:  Procedure Laterality Date   TEE WITHOUT CARDIOVERSION N/A 05/31/2019   Procedure: TRANSESOPHAGEAL ECHOCARDIOGRAM (TEE);  Surgeon: [name], MD;  Location: [facility] ORS;  Service: Cardiovascular;  Laterality: N/A;    Home Medications:  Allergies as of 12/23/2023       Reactions   Penicillins    Did it involve swelling of the face/tongue/throat, SOB, or low BP?  Unknown Did it involve sudden or severe rash/hives, skin peeling, or any reaction on the inside of your mouth or nose? Unknown Did you need to seek medical attention at a hospital or doctor's office? Unknown When did it last happen?      childhood allergy If all above answers are "NO", may proceed with cephalosporin use.        Medication List        Accurate as of Dec 23, 2023  2:50 PM. If you have any questions, ask your nurse or doctor.          acetaminophen  500 MG tablet Commonly known as: TYLENOL  Take 1,000 mg by mouth every 6 (six) hours as needed for moderate pain or headache.   aspirin  EC 81 MG tablet Take 81 mg by mouth daily.   atorvastatin  40 MG tablet Commonly known as: LIPITOR Take 1 tablet (40 mg total) by mouth daily.   cholecalciferol 25 MCG (1000 UNIT) tablet Commonly known as: VITAMIN D3 Take 1,000 Units by mouth at bedtime.   cyanocobalamin  1000 MCG tablet Commonly known as: VITAMIN B12 Take 1,000 mcg by mouth at bedtime.   desonide  0.05 % lotion Commonly known as: DESOWEN  Apply topically 2 (two) times daily as needed (rosacea). Use for up to 3-5 days then stop, may repeat   gabapentin  300 MG capsule Commonly known as: NEURONTIN  Take 2 capsules (600 mg total) by mouth  at bedtime.   ketorolac  10 MG tablet Commonly known as: TORADOL  Take 1 tablet (10 mg total) by mouth every 6 (six) hours as needed.   metFORMIN  500 MG tablet Commonly known as: GLUCOPHAGE  Take 1 tablet (500 mg total) by mouth 2 (two) times daily with a meal.   omeprazole 20 MG tablet Commonly known as: PRILOSEC OTC Take 20-40 mg by mouth daily as needed (acid reflux).   oxyCODONE -acetaminophen  5-325 MG tablet Commonly known as: Percocet Take 1 tablet by mouth every 4 (four) hours as needed for severe pain (pain score 7-10).   valsartan  160 MG tablet Commonly known as: DIOVAN  Take 160 mg by mouth daily.   VITAMIN C PO Take 1 tablet by mouth daily.   ZINC PO Take 1  tablet by mouth daily.        Allergies:  Allergies  Allergen Reactions   Penicillins     Did it involve swelling of the face/tongue/throat, SOB, or low BP? Unknown Did it involve sudden or severe rash/hives, skin peeling, or any reaction on the inside of your mouth or nose? Unknown Did you need to seek medical attention at a hospital or doctor's office? Unknown When did it last happen?      childhood allergy If all above answers are "NO", may proceed with cephalosporin use.     Family History: Family History  Problem Relation Age of Onset   Cancer Father        renal/pancreatic   CAD Father    Diabetes Father    Prostate cancer Maternal Grandfather    Bladder Cancer Neg Hx     Social History:   reports that he has never smoked. He quit smokeless tobacco use about 4 years ago.  His smokeless tobacco use included chew. He reports current alcohol use of about 1.0 - 4.0 standard drink of alcohol per week. He reports that he does not use drugs.  Physical Exam: There were no vitals taken for this visit.  Constitutional:  Alert and oriented, no acute distress, nontoxic appearing HEENT: [place], AT Cardiovascular: No clubbing, cyanosis, or edema Respiratory: Normal respiratory effort, no increased work of breathing Skin: No rashes, bruises or suspicious lesions Neurologic: Grossly intact, no focal deficits, moving all 4 extremities Psychiatric: Normal mood and affect  Laboratory Data: Results for orders placed or performed in visit on 12/23/23  Microscopic Examination   Collection Time: 12/23/23  2:03 PM   Urine  Result Value Ref Range   WBC, UA 0-5 0 - 5 /hpf   RBC, Urine 0-2 0 - 2 /hpf   Epithelial Cells (non renal) 0-10 0 - 10 /hpf   Casts Present (A) None seen /lpf   Cast Type Granular casts (A) N/A   Mucus, UA Present (A) Not Estab.   Bacteria, UA Moderate (A) None seen/Few  Urinalysis, Complete   Collection Time: 12/23/23  2:03 PM  Result Value Ref Range   Specific  Gravity, UA 1.030 1.005 - 1.030   pH, UA 6.0 5.0 - 7.5   Color, UA Yellow Yellow   Appearance Ur Clear Clear   Leukocytes,UA Negative Negative   Protein,UA Trace Negative/Trace   Glucose, UA Negative Negative   Ketones, UA Trace (A) Negative   RBC, UA Negative Negative   Bilirubin, UA Negative Negative   Urobilinogen, Ur 1.0 0.2 - 1.0 mg/dL   Nitrite, UA Negative Negative   Microscopic Examination See below:    Pertinent Imaging: KUB, 12/23/2023: See epic  I personally reviewed  the images referenced above and note no radiopaque urolithiasis.  Assessment & Plan:   1. Left ureteral stone (Primary) He is still symptomatic of his left ureteral stone, though UA today is bland.  Symptoms are mild to moderate.  We discussed various treatment options for his stone including trial of passage vs. ureteroscopy with laser lithotripsy and stent.  He is not a candidate for ESWL, as I cannot see the stone on x-ray today.  We specifically discussed that ureteroscopy would be performed under general anesthesia as an outpatient procedure.  Based on this conversation, he would like to proceed with trial of passage which is reasonable.  Will resume Flomax  and send in Zofran  and Percocet for symptom control.  I am also sending urine for culture, though anticipate this will be negative.  Will see him back in 3 weeks to recheck his symptoms and we discussed return precautions including fever, uncontrolled pain, or uncontrolled nausea/vomiting. - Urinalysis, Complete - CULTURE, URINE COMPREHENSIVE - oxyCODONE -acetaminophen  (PERCOCET) 5-325 MG tablet; Take 1-2 tablets by mouth every 6 (six) hours as needed for severe pain (pain score 7-10).  Dispense: 10 tablet; Refill: 0 - tamsulosin  (FLOMAX ) 0.4 MG CAPS capsule; Take 1 capsule (0.4 mg total) by mouth daily.  Dispense: 30 capsule; Refill: 0 - ondansetron  (ZOFRAN -ODT) 4 MG disintegrating tablet; Take 1 tablet (4 mg total) by mouth every 8 (eight) hours as  needed for nausea or vomiting.  Dispense: 20 tablet; Refill: 0  2. Peyronie disease New within the past several months.  Will start low-dose daily tadalafil and recheck him in several weeks.  We discussed that if the curvature worsens, we may need to get him set up for a modeling appointment to measure his degree of curvature for consideration of Xiaflex. - tadalafil (CIALIS) 5 MG tablet; Take 1 tablet (5 mg total) by mouth daily.  Dispense: 30 tablet; Refill: 2   Return in about 3 weeks (around 01/13/2024) for Symptom recheck and UA.  Kathreen Pare, PA-C  [facility] [place] [address] [place], [place] [address] [phone]

## 2023-12-24 LAB — URINALYSIS, COMPLETE
Bilirubin, UA: NEGATIVE
Glucose, UA: NEGATIVE
Leukocytes,UA: NEGATIVE
Nitrite, UA: NEGATIVE
RBC, UA: NEGATIVE
Specific Gravity, UA: 1.03 (ref 1.005–1.030)
Urobilinogen, Ur: 1 mg/dL (ref 0.2–1.0)
pH, UA: 6 (ref 5.0–7.5)

## 2023-12-24 LAB — MICROSCOPIC EXAMINATION

## 2023-12-28 ENCOUNTER — Ambulatory Visit (INDEPENDENT_AMBULATORY_CARE_PROVIDER_SITE_OTHER): Payer: Self-pay | Admitting: Family Medicine

## 2023-12-28 ENCOUNTER — Encounter: Payer: Self-pay | Admitting: Family Medicine

## 2023-12-28 VITALS — BP 108/66 | HR 83 | Ht 74.0 in | Wt 252.4 lb

## 2023-12-28 DIAGNOSIS — E538 Deficiency of other specified B group vitamins: Secondary | ICD-10-CM

## 2023-12-28 DIAGNOSIS — I700 Atherosclerosis of aorta: Secondary | ICD-10-CM

## 2023-12-28 DIAGNOSIS — Z7985 Long-term (current) use of injectable non-insulin antidiabetic drugs: Secondary | ICD-10-CM

## 2023-12-28 DIAGNOSIS — I6930 Unspecified sequelae of cerebral infarction: Secondary | ICD-10-CM | POA: Diagnosis not present

## 2023-12-28 DIAGNOSIS — G4733 Obstructive sleep apnea (adult) (pediatric): Secondary | ICD-10-CM

## 2023-12-28 DIAGNOSIS — Z7984 Long term (current) use of oral hypoglycemic drugs: Secondary | ICD-10-CM

## 2023-12-28 DIAGNOSIS — E1169 Type 2 diabetes mellitus with other specified complication: Secondary | ICD-10-CM

## 2023-12-28 DIAGNOSIS — Z0000 Encounter for general adult medical examination without abnormal findings: Secondary | ICD-10-CM

## 2023-12-28 DIAGNOSIS — E66811 Obesity, class 1: Secondary | ICD-10-CM

## 2023-12-28 LAB — CULTURE, URINE COMPREHENSIVE

## 2023-12-28 MED ORDER — OZEMPIC (0.25 OR 0.5 MG/DOSE) 2 MG/3ML ~~LOC~~ SOPN: PEN_INJECTOR | SUBCUTANEOUS | Status: DC

## 2023-12-28 NOTE — Patient Instructions (Addendum)
 Thank you for coming to the office today.  Switch to Ozempic start with 0.25 weekly dose, LOW starter dosing for 4 weeks, then dose increase to 0.5mg  standard dosing for remaining 2 weeks until empty.  Contact me back on mychart or call within 4-5 weeks from today, basically before you run out.  Okay to phase off Metformin  anytime you are ready, keep in mind less likely to be covered on early dose Ozempic first 4 weeks.  --------------  Recommend Future Pneumonia vaccine Prevnar-20   Recommend yearly diabetic eye exam, send us  copy of report  [facility]   Address: [address] [place], [place] [address] Phone: [phone]  Website: [email or web address]   [place] [address] [place], [place] [address] Phone: [phone] [email or web address]  [facility]  Address: [address] [place], [place], [place] [address] Phone: [phone]   [facility] [address] [place], [place] [address] Phone: [phone]  [facility] Address: [address] [place], [place], [place] [address]  Phone: [phone]   Please schedule a Follow-up Appointment to: Return in about 4 months (around 04/29/2024) for 4 month DM A1c (on Ozempic).  If you have any other questions or concerns, please feel free to call the office or send a message through MyChart. You may also schedule an earlier appointment if necessary.  Additionally, you may be receiving a survey about your experience at our office within a few days to 1 week by e-mail or mail. We value your feedback.  Domingo Friend, DO [facility], [place]

## 2023-12-28 NOTE — Progress Notes (Signed)
 Subjective:    Patient ID: Randy Watson, male    DOB: [birth year], 63 y.o.   MRN: [id number]  Randy Watson is a 63 y.o. male presenting on 12/28/2023 for Annual Exam   HPI  Discussed the use of AI scribe software for clinical note transcription with the patient, who gave verbal consent to proceed.  History of Present Illness   Randy Watson "Athena Bland" is a 63 year old male with type 2 diabetes and hyperlipidemia who presents for an annual physical exam.     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 His A1c has fluctuated between 6.2 and 6.7 in the past. Note his Cardiologist recommended that he start Ozempic GLP1 for cardiovascular risk reduction, as he has history of prior CVA. Meds: Metformin  500mg  TWICE A DAY  Reports good compliance. Tolerating well w/o side-effects Denies hypoglycemia, polyuria, visual changes, numbness or tingling.  History of CVA, L cerebellar w/ PICA He continues on ASA 81, Lipitor 40mg  Now followed by Cardiology at [facility]  Requested GLP1   Vitamin D  Deficiency Vitamin B12 deficiency Improved to Lab 49. On Vita D 1000unit daily Normalized, now has too high B12 level, he has reduced from 2000 down to 1000 daily now   HYPERLIPIDEMIA: - Reports no concerns. Last lipid panel 12/2023 with LDL 44, total 115 - Currently taking Atorvastatin  40mg , tolerating well without side effects or myalgias   CHRONIC HTN: Followed by [facility] Current Meds - Valsartan  160mg  daily Reports good compliance, took meds today. Tolerating well, w/o complaints. Denies CP, dyspnea, HA, edema, dizziness / lightheadedness   OSA, on CPAP - Patient reports prior history of dx OSA and on CPAP - Today reports that sleep apnea is well controlled. He uses the CPAP machine every night. Tolerates  the machine well, and thinks that sleeps better with it and feels good. No new concerns or symptoms.      Additional history   PMH history Kidney Stone Followed by [facility] Dr Ace Holder       Health Maintenance:   Prostate CA Screening: Last PSA 0.74 12/2023. Currently asymptomatic. No known family history of prostate CA.    Shingles vaccine eligible, defer for now.   Colon CA Screening: Last Cologuard 12/28/22 (negative) next due in 3 years. Due now ordered 12/2025  Future Prevnar-20 pneumonia vaccine and Shinrix vaccine     12/28/2023    8:17 AM 07/21/2023    8:13 AM 04/13/2023    8:14 AM  Depression screen PHQ 2/9  Decreased Interest 0 0 0  Down, Depressed, Hopeless 0 0 0  PHQ - 2 Score 0 0 0       12/28/2023    8:17 AM 07/21/2023    8:13 AM 04/13/2023    8:14 AM 12/14/2022    8:09 AM  GAD 7 : Generalized Anxiety Score  Nervous, Anxious, on Edge 0 0 0 0  Control/stop worrying 0 0 0 0  Worry too much - different things 0 0 0 0  Trouble relaxing 0 0 0 0  Restless 0 0 0 0  Easily annoyed or irritable 0 0 0 0  Afraid - awful might happen 0 0 0 0  Total GAD 7 Score 0 0 0 0     Past Medical History:  Diagnosis  Date   Allergy    Anal fissure    Constipation    GERD (gastroesophageal reflux disease)    History of chicken pox    Obesity    Stroke (HCC) 12/2018   Past Surgical History:  Procedure Laterality Date   TEE WITHOUT CARDIOVERSION N/A 05/31/2019   Procedure: TRANSESOPHAGEAL ECHOCARDIOGRAM (TEE);  Surgeon: [name], MD;  Location: [facility] ORS;  Service: Cardiovascular;  Laterality: N/A;   Social History   Socioeconomic History   Marital status: Married    Spouse name: Amoni Morales   Number of children: Not on file   Years of education: Not on file   Highest education level: Bachelor's degree (e.g., BA, AB, BS)  Occupational History   Not on file  Tobacco Use   Smoking status: Never   Smokeless tobacco: Former    Types: Chew    Quit  date: 01/01/2019   Tobacco comments:    dip  Vaping Use   Vaping status: Never Used  Substance and Sexual Activity   Alcohol use: Yes    Alcohol/week: 1.0 - 4.0 standard drink of alcohol    Types: 1 - 4 Standard drinks or equivalent per week    Comment: Only on the weekends   Drug use: Never   Sexual activity: Yes    Partners: Female  Other Topics Concern   Not on file  Social History Narrative   Not on file   Social Drivers of Health   Financial Resource Strain: Low Risk  (12/27/2023)   Overall Financial Resource Strain (CARDIA)    Difficulty of Paying Living Expenses: Not hard at all  Food Insecurity: No Food Insecurity (12/27/2023)   Hunger Vital Sign    Worried About Running Out of Food in the Last Year: Never true    Ran Out of Food in the Last Year: Never true  Transportation Needs: No Transportation Needs (12/27/2023)   PRAPARE - Administrator, Civil Service (Medical): No    Lack of Transportation (Non-Medical): No  Physical Activity: Insufficiently Active (12/27/2023)   Exercise Vital Sign    Days of Exercise per Week: 2 days    Minutes of Exercise per Session: 30 min  Stress: No Stress Concern Present (12/27/2023)   [employer] of Occupational Health - Occupational Stress Questionnaire    Feeling of Stress : Not at all  Social Connections: Socially Integrated (12/27/2023)   Social Connection and Isolation Panel [NHANES]    Frequency of Communication with Friends and Family: More than three times a week    Frequency of Social Gatherings with Friends and Family: Three times a week    Attends Religious Services: More than 4 times per year    Active Member of Clubs or Organizations: Yes    Attends Engineer, structural: More than 4 times per year    Marital Status: Married  Catering manager Violence: Not on file   Family History  Problem Relation Age of Onset   Cancer Father        renal/pancreatic   CAD Father    Diabetes Father     Prostate cancer Maternal Grandfather    Bladder Cancer Neg Hx    Current Outpatient Medications on File Prior to Visit  Medication Sig   acetaminophen  (TYLENOL ) 500 MG tablet Take 1,000 mg by mouth every 6 (six) hours as needed for moderate pain or headache.   Ascorbic Acid (VITAMIN C PO) Take 1 tablet by mouth daily.  ascorbic acid (VITAMIN C) 500 MG tablet Take 500 mg by mouth.   aspirin  81 MG EC tablet Take 81 mg by mouth daily.   atorvastatin  (LIPITOR) 40 MG tablet Take 1 tablet (40 mg total) by mouth daily.   cholecalciferol (VITAMIN D3) 25 MCG (1000 UT) tablet Take 1,000 Units by mouth at bedtime.   desonide  (DESOWEN ) 0.05 % lotion Apply topically 2 (two) times daily as needed (rosacea). Use for up to 3-5 days then stop, may repeat   gabapentin  (NEURONTIN ) 300 MG capsule Take 2 capsules (600 mg total) by mouth at bedtime.   ketorolac  (TORADOL ) 10 MG tablet Take 1 tablet (10 mg total) by mouth every 6 (six) hours as needed.   metFORMIN  (GLUCOPHAGE ) 500 MG tablet Take 1 tablet (500 mg total) by mouth 2 (two) times daily with a meal.   Multiple Vitamins-Minerals (ZINC PO) Take 1 tablet by mouth daily.   omeprazole (PRILOSEC OTC) 20 MG tablet Take 20-40 mg by mouth daily as needed (acid reflux).   ondansetron  (ZOFRAN -ODT) 4 MG disintegrating tablet Take 1 tablet (4 mg total) by mouth every 8 (eight) hours as needed for nausea or vomiting.   oxyCODONE -acetaminophen  (PERCOCET) 5-325 MG tablet Take 1-2 tablets by mouth every 6 (six) hours as needed for severe pain (pain score 7-10).   tadalafil  (CIALIS ) 5 MG tablet Take 1 tablet (5 mg total) by mouth daily.   tamsulosin  (FLOMAX ) 0.4 MG CAPS capsule Take 1 capsule (0.4 mg total) by mouth daily.   valsartan  (DIOVAN ) 160 MG tablet Take 160 mg by mouth daily.   vitamin B-12 (CYANOCOBALAMIN ) 1000 MCG tablet Take 1,000 mcg by mouth at bedtime.   [DISCONTINUED] triamcinolone  (NASACORT  ALLERGY 24HR) 55 MCG/ACT AERO nasal inhaler Place 1 spray into the  nose daily as needed (allergies).   No current facility-administered medications on file prior to visit.    Review of Systems  Constitutional:  Negative for activity change, appetite change, chills, diaphoresis, fatigue and fever.  HENT:  Negative for congestion and hearing loss.   Eyes:  Negative for visual disturbance.  Respiratory:  Negative for cough, chest tightness, shortness of breath and wheezing.   Cardiovascular:  Negative for chest pain, palpitations and leg swelling.  Gastrointestinal:  Negative for abdominal pain, constipation, diarrhea, nausea and vomiting.  Genitourinary:  Negative for dysuria, frequency and hematuria.  Musculoskeletal:  Negative for arthralgias and neck pain.  Skin:  Negative for rash.  Neurological:  Negative for dizziness, weakness, light-headedness, numbness and headaches.  Hematological:  Negative for adenopathy.  Psychiatric/Behavioral:  Negative for behavioral problems, dysphoric mood and sleep disturbance.    Per HPI unless specifically indicated above     Objective:     BP 108/66 (BP Location: Right Arm, Patient Position: Sitting, Cuff Size: Large)   Pulse 83   Ht 6\' 2"  (1.88 m)   Wt 252 lb 6 oz (114.5 kg)   SpO2 96%   BMI 32.40 kg/m   Wt Readings from Last 3 Encounters:  12/28/23 252 lb 6 oz (114.5 kg)  12/23/23 255 lb (115.7 kg)  11/26/23 258 lb (117 kg)    Physical Exam Vitals and nursing note reviewed.  Constitutional:      General: He is not in acute distress.    Appearance: He is well-developed. He is not diaphoretic.     Comments: Well-appearing, comfortable, cooperative  HENT:     Head: Normocephalic and atraumatic.  Eyes:     General:  Right eye: No discharge.        Left eye: No discharge.     Conjunctiva/sclera: Conjunctivae normal.     Pupils: Pupils are equal, round, and reactive to light.  Neck:     Thyroid: No thyromegaly.     Vascular: No carotid bruit.  Cardiovascular:     Rate and Rhythm: Normal  rate and regular rhythm.     Pulses: Normal pulses.     Heart sounds: Normal heart sounds. No murmur heard. Pulmonary:     Effort: Pulmonary effort is normal. No respiratory distress.     Breath sounds: Normal breath sounds. No wheezing or rales.  Abdominal:     General: Bowel sounds are normal. There is no distension.     Palpations: Abdomen is soft. There is no mass.     Tenderness: There is no abdominal tenderness.  Musculoskeletal:        General: No tenderness. Normal range of motion.     Cervical back: Normal range of motion and neck supple.     Right lower leg: No edema.     Left lower leg: No edema.     Comments: Upper / Lower Extremities: - Normal muscle tone, strength bilateral upper extremities 5/5, lower extremities 5/5  Lymphadenopathy:     Cervical: No cervical adenopathy.  Skin:    General: Skin is warm and dry.     Findings: No erythema or rash.  Neurological:     Mental Status: He is alert and oriented to person, place, and time.     Comments: Distal sensation intact to light touch all extremities  Psychiatric:        Mood and Affect: Mood normal.        Behavior: Behavior normal.        Thought Content: Thought content normal.     Comments: Well groomed, good eye contact, normal speech and thoughts     I have personally reviewed the radiology report from 12/22/23.  CLINICAL DATA:  Risk stratification   EXAM: Coronary Calcium  Score   TECHNIQUE: The patient was scanned on a Siemens Somatom scanner. Axial non-contrast 3 mm slices were carried out through the heart. The data set was analyzed on a dedicated work station and scored using the Agatson method.   FINDINGS: Non-cardiac: See separate report from [facility].   Ascending Aorta: Normal size   Pericardium: Normal   Coronary arteries: Normal origin of left and right coronary arteries. Distribution of arterial calcifications if present, as noted below;   LM 0   LAD 402   LCx  48.1   RCA 44.1   Total 494   IMPRESSION AND RECOMMENDATION: 1. Coronary calcium  score of 494. This was 86th percentile for age and sex matched control.   2. CAC >300 in LAD, LCx, RCA. CAC-DRS A3/N3.   3. Recommend aspirin  and statin if no contraindication.   4. Recommend cardiology consultation.   5. Continue heart healthy lifestyle and risk factor modification.     Electronically Signed   By: Constancia Delton M.D.   On: 12/22/2023 17:36  Results for orders placed or performed in visit on 12/23/23  Microscopic Examination   Collection Time: 12/23/23  2:03 PM   Urine  Result Value Ref Range   WBC, UA 0-5 0 - 5 /hpf   RBC, Urine 0-2 0 - 2 /hpf   Epithelial Cells (non renal) 0-10 0 - 10 /hpf   Casts Present (A) None seen /lpf  Cast Type Granular casts (A) N/A   Mucus, UA Present (A) Not Estab.   Bacteria, UA Moderate (A) None seen/Few  Urinalysis, Complete   Collection Time: 12/23/23  2:03 PM  Result Value Ref Range   Specific Gravity, UA 1.030 1.005 - 1.030   pH, UA 6.0 5.0 - 7.5   Color, UA Yellow Yellow   Appearance Ur Clear Clear   Leukocytes,UA Negative Negative   Protein,UA Trace Negative/Trace   Glucose, UA Negative Negative   Ketones, UA Trace (A) Negative   RBC, UA Negative Negative   Bilirubin, UA Negative Negative   Urobilinogen, Ur 1.0 0.2 - 1.0 mg/dL   Nitrite, UA Negative Negative   Microscopic Examination See below:   CULTURE, URINE COMPREHENSIVE   Collection Time: 12/23/23  3:04 PM   Specimen: Urine   UR  Result Value Ref Range   Urine Culture, Comprehensive Preliminary report    Organism ID, Bacteria Comment       Assessment & Plan:   Problem List Items Addressed This Visit     Aortic atherosclerosis (HCC)   History of cerebrovascular accident (CVA) with residual deficit   Obesity (BMI 30.0-34.9)   OSA (obstructive sleep apnea)   Type 2 diabetes mellitus with other specified complication (HCC)   Relevant Medications   OZEMPIC,  0.25 OR 0.5 MG/DOSE, 2 MG/3ML SOPN   Vitamin B12 deficiency   Other Visit Diagnoses       Annual physical exam    -  Primary     Long term current use of oral hypoglycemic drug         Long-term current use of injectable noninsulin antidiabetic medication            Updated Health Maintenance information Reviewed recent lab results with patient Encouraged improvement to lifestyle with diet and exercise Goal of weight loss   Type 2 diabetes mellitus Diabetes well-controlled with A1c of 6.2. Discussed switch to Ozempic for better glycemic control and cardiovascular benefits. Recommended by his Cardiologist. Explained side effects and benefits. Plan to taper metformin  after four weeks of Ozempic. - Provide six-week sample of Ozempic free today - Start Ozempic at 0.25 mg weekly for four weeks, then increase to 0.5 mg. - Check insurance coverage for Ozempic. - Contact office in four to five weeks to discuss continuation and insurance approval. Will need NEW RX for starting Ozempic 0.5mg  weekly - Consider tapering off metformin  after four weeks of Ozempic.  Hyperlipidemia Atherosclerosis Aortic Hyperlipidemia well-controlled with atorvastatin . LDL 42, total cholesterol 253. - Continue atorvastatin  40 mg daily.  History of CVA Residual deficit. Stable without complication On med management  OSA on CPAP Well controlled - Good adherence to CPAP nightly - Continue current CPAP therapy, patient seems to be benefiting from therapy   General Health Maintenance Reviewed immunizations and screenings. Eye exam due by year-end. Cologuard negative, next due May 2027. Normal kidney and liver function, normal PSA, slightly elevated vitamin B12 not concerning. - Schedule eye exam by year-end. - Consider pneumonia and shingles vaccines in future. - Continue routine screenings as scheduled.        No orders of the defined types were placed in this encounter.   Meds ordered this  encounter  Medications   OZEMPIC, 0.25 OR 0.5 MG/DOSE, 2 MG/3ML SOPN    Sig: Start with 0.25mg  weekly x 4 weeks then increase to 0.5mg  weekly injection.    84 day supply     Follow up plan: Return in about  4 months (around 04/29/2024) for 4 month DM A1c (on Ozempic).  Domingo Friend, DO [facility] 12/28/2023, 8:35 AM

## 2024-01-04 ENCOUNTER — Other Ambulatory Visit: Payer: Self-pay | Admitting: Nurse Practitioner

## 2024-01-04 DIAGNOSIS — E119 Type 2 diabetes mellitus without complications: Secondary | ICD-10-CM

## 2024-01-04 DIAGNOSIS — Z8249 Family history of ischemic heart disease and other diseases of the circulatory system: Secondary | ICD-10-CM

## 2024-01-04 DIAGNOSIS — I2510 Atherosclerotic heart disease of native coronary artery without angina pectoris: Secondary | ICD-10-CM

## 2024-01-04 DIAGNOSIS — R931 Abnormal findings on diagnostic imaging of heart and coronary circulation: Secondary | ICD-10-CM

## 2024-01-04 DIAGNOSIS — E782 Mixed hyperlipidemia: Secondary | ICD-10-CM

## 2024-01-04 DIAGNOSIS — I10 Essential (primary) hypertension: Secondary | ICD-10-CM

## 2024-01-04 DIAGNOSIS — G4733 Obstructive sleep apnea (adult) (pediatric): Secondary | ICD-10-CM

## 2024-01-04 DIAGNOSIS — I63542 Cerebral infarction due to unspecified occlusion or stenosis of left cerebellar artery: Secondary | ICD-10-CM

## 2024-01-05 ENCOUNTER — Other Ambulatory Visit: Payer: Self-pay | Admitting: Nurse Practitioner

## 2024-01-05 DIAGNOSIS — E119 Type 2 diabetes mellitus without complications: Secondary | ICD-10-CM

## 2024-01-05 DIAGNOSIS — E782 Mixed hyperlipidemia: Secondary | ICD-10-CM

## 2024-01-05 DIAGNOSIS — G4733 Obstructive sleep apnea (adult) (pediatric): Secondary | ICD-10-CM

## 2024-01-05 DIAGNOSIS — I63542 Cerebral infarction due to unspecified occlusion or stenosis of left cerebellar artery: Secondary | ICD-10-CM

## 2024-01-05 DIAGNOSIS — I2510 Atherosclerotic heart disease of native coronary artery without angina pectoris: Secondary | ICD-10-CM

## 2024-01-05 DIAGNOSIS — I10 Essential (primary) hypertension: Secondary | ICD-10-CM

## 2024-01-05 DIAGNOSIS — R931 Abnormal findings on diagnostic imaging of heart and coronary circulation: Secondary | ICD-10-CM

## 2024-01-13 ENCOUNTER — Ambulatory Visit: Admitting: Physician Assistant

## 2024-01-13 VITALS — BP 110/71 | HR 74 | Ht 74.0 in | Wt 250.0 lb

## 2024-01-13 DIAGNOSIS — N201 Calculus of ureter: Secondary | ICD-10-CM

## 2024-01-13 DIAGNOSIS — N486 Induration penis plastica: Secondary | ICD-10-CM | POA: Diagnosis not present

## 2024-01-13 MED ORDER — TADALAFIL 5 MG PO TABS: 5.0000 mg | ORAL_TABLET | Freq: Every day | ORAL | 11 refills | Status: AC

## 2024-01-13 NOTE — Progress Notes (Signed)
 01/13/2024 4:57 PM   [name] Dec 13, 1960 [id number]  CC: Chief Complaint  Patient presents with   Nephrolithiasis   HPI: Randy Watson is a 63 y.o. male with PMH CVA, DM 2, Peyronie's disease, and nephrolithiasis with a recent 2 mm left ureteral stone who presents today for follow-up on low-dose daily tadalafil .   Today he reports he never saw stone pass, but his symptoms have completely resolved.  He denies pain, nausea, or vomiting.  Thinks his penile curvature has improved slightly on tadalafil .  He has also had improved quality of erections.  PMH: Past Medical History:  Diagnosis Date   Allergy    Anal fissure    Constipation    GERD (gastroesophageal reflux disease)    History of chicken pox    Obesity    Stroke (HCC) 12/2018    Surgical History: Past Surgical History:  Procedure Laterality Date   TEE WITHOUT CARDIOVERSION N/A 05/31/2019   Procedure: TRANSESOPHAGEAL ECHOCARDIOGRAM (TEE);  Surgeon: [name], MD;  Location: [facility] ORS;  Service: Cardiovascular;  Laterality: N/A;    Home Medications:  Allergies as of 01/13/2024       Reactions   Penicillins    Did it involve swelling of the face/tongue/throat, SOB, or low BP? Unknown Did it involve sudden or severe rash/hives, skin peeling, or any reaction on the inside of your mouth or nose? Unknown Did you need to seek medical attention at a hospital or doctor's office? Unknown When did it last happen?      childhood allergy If all above answers are "NO", may proceed with cephalosporin use.        Medication List        Accurate as of January 13, 2024  4:57 PM. If you have any questions, ask your nurse or doctor.          acetaminophen  500 MG tablet Commonly known as: TYLENOL  Take 1,000 mg by mouth every 6 (six) hours as needed for moderate pain or headache.   ascorbic acid 500 MG tablet Commonly known as: VITAMIN C Take 500 mg by mouth.   aspirin  EC 81 MG tablet Take 81 mg  by mouth daily.   atorvastatin  40 MG tablet Commonly known as: LIPITOR Take 1 tablet (40 mg total) by mouth daily.   cholecalciferol 25 MCG (1000 UNIT) tablet Commonly known as: VITAMIN D3 Take 1,000 Units by mouth at bedtime.   cyanocobalamin  1000 MCG tablet Commonly known as: VITAMIN B12 Take 1,000 mcg by mouth at bedtime.   desonide  0.05 % lotion Commonly known as: DESOWEN  Apply topically 2 (two) times daily as needed (rosacea). Use for up to 3-5 days then stop, may repeat   gabapentin  300 MG capsule Commonly known as: NEURONTIN  Take 2 capsules (600 mg total) by mouth at bedtime.   ketorolac  10 MG tablet Commonly known as: TORADOL  Take 1 tablet (10 mg total) by mouth every 6 (six) hours as needed.   metFORMIN  500 MG tablet Commonly known as: GLUCOPHAGE  Take 1 tablet (500 mg total) by mouth 2 (two) times daily with a meal.   omeprazole 20 MG tablet Commonly known as: PRILOSEC OTC Take 20-40 mg by mouth daily as needed (acid reflux).   ondansetron  4 MG disintegrating tablet Commonly known as: ZOFRAN -ODT Take 1 tablet (4 mg total) by mouth every 8 (eight) hours as needed for nausea or vomiting.   oxyCODONE -acetaminophen  5-325 MG tablet Commonly known as: Percocet Take 1-2 tablets by mouth every 6 (  six) hours as needed for severe pain (pain score 7-10).   Ozempic  (0.25 or 0.5 MG/DOSE) 2 MG/3ML Sopn Generic drug: Semaglutide (0.25 or 0.5MG /DOS) Start with 0.25mg  weekly x 4 weeks then increase to 0.5mg  weekly injection.   tadalafil  5 MG tablet Commonly known as: CIALIS  Take 1 tablet (5 mg total) by mouth daily.   tamsulosin  0.4 MG Caps capsule Commonly known as: FLOMAX  Take 1 capsule (0.4 mg total) by mouth daily.   valsartan  160 MG tablet Commonly known as: DIOVAN  Take 160 mg by mouth daily.   VITAMIN C PO Take 1 tablet by mouth daily.   ZINC PO Take 1 tablet by mouth daily.        Allergies:  Allergies  Allergen Reactions   Penicillins     Did it  involve swelling of the face/tongue/throat, SOB, or low BP? Unknown Did it involve sudden or severe rash/hives, skin peeling, or any reaction on the inside of your mouth or nose? Unknown Did you need to seek medical attention at a hospital or doctor's office? Unknown When did it last happen?      childhood allergy If all above answers are "NO", may proceed with cephalosporin use.     Family History: Family History  Problem Relation Age of Onset   Cancer Father        renal/pancreatic   CAD Father    Diabetes Father    Prostate cancer Maternal Grandfather    Bladder Cancer Neg Hx     Social History:   reports that he has never smoked. He quit smokeless tobacco use about 5 years ago.  His smokeless tobacco use included chew. He reports current alcohol use of about 1.0 - 4.0 standard drink of alcohol per week. He reports that he does not use drugs.  Physical Exam: BP 110/71   Pulse 74   Ht 6\' 2"  (1.88 m)   Wt 250 lb (113.4 kg)   BMI 32.10 kg/m   Constitutional:  Alert and oriented, no acute distress, nontoxic appearing HEENT: [place], AT Cardiovascular: No clubbing, cyanosis, or edema Respiratory: Normal respiratory effort, no increased work of breathing Skin: No rashes, bruises or suspicious lesions Neurologic: Grossly intact, no focal deficits, moving all 4 extremities Psychiatric: Normal mood and affect  Assessment & Plan:   1. Left ureteral stone (Primary) Symptoms have resolved.  I asked him to leave a UA on the way out and we will contact him with results.  He understands to come back if his pain, nausea, or vomiting recur. - Urinalysis, Complete  2. Peyronie disease Slight improvement on low-dose daily tadalafil , will continue this.  I will see him back in 6 months for symptom recheck. - tadalafil  (CIALIS ) 5 MG tablet; Take 1 tablet (5 mg total) by mouth daily.  Dispense: 30 tablet; Refill: 11   Return in about 6 months (around 07/14/2024) for Sx recheck and  SHIM.  Kathreen Pare, PA-C  [facility] [place] [address], [address] [place], [place] [address] [phone]

## 2024-01-14 LAB — URINALYSIS, COMPLETE
Bilirubin, UA: NEGATIVE
Glucose, UA: NEGATIVE
Ketones, UA: NEGATIVE
Leukocytes,UA: NEGATIVE
Nitrite, UA: NEGATIVE
Protein,UA: NEGATIVE
RBC, UA: NEGATIVE
Specific Gravity, UA: 1.025 (ref 1.005–1.030)
Urobilinogen, Ur: 1 mg/dL (ref 0.2–1.0)
pH, UA: 6.5 (ref 5.0–7.5)

## 2024-01-14 LAB — MICROSCOPIC EXAMINATION: Bacteria, UA: NONE SEEN

## 2024-01-17 ENCOUNTER — Other Ambulatory Visit: Payer: Self-pay | Admitting: Physician Assistant

## 2024-01-17 DIAGNOSIS — N201 Calculus of ureter: Secondary | ICD-10-CM

## 2024-01-19 ENCOUNTER — Telehealth (HOSPITAL_COMMUNITY): Payer: Self-pay | Admitting: Emergency Medicine

## 2024-01-19 DIAGNOSIS — R079 Chest pain, unspecified: Secondary | ICD-10-CM

## 2024-01-19 NOTE — Telephone Encounter (Signed)
 Reaching out to patient to offer assistance regarding upcoming cardiac imaging study; pt verbalizes understanding of appt date/time, parking situation and where to check in, pre-test NPO status and medications ordered, and verified current allergies; name and call back number provided for further questions should they arise Rockwell Alexandria RN Navigator Cardiac Imaging [facility] [phone] office [phone] cell

## 2024-01-20 ENCOUNTER — Ambulatory Visit
Admission: RE | Admit: 2024-01-20 | Discharge: 2024-01-20 | Disposition: A | Discharge status: Home / Self Care | Source: Ambulatory Visit | Attending: Nurse Practitioner | Admitting: Nurse Practitioner

## 2024-01-20 DIAGNOSIS — E782 Mixed hyperlipidemia: Secondary | ICD-10-CM | POA: Insufficient documentation

## 2024-01-20 DIAGNOSIS — I10 Essential (primary) hypertension: Secondary | ICD-10-CM | POA: Diagnosis present

## 2024-01-20 DIAGNOSIS — E119 Type 2 diabetes mellitus without complications: Secondary | ICD-10-CM | POA: Insufficient documentation

## 2024-01-20 DIAGNOSIS — I2510 Atherosclerotic heart disease of native coronary artery without angina pectoris: Secondary | ICD-10-CM | POA: Diagnosis not present

## 2024-01-20 DIAGNOSIS — R931 Abnormal findings on diagnostic imaging of heart and coronary circulation: Secondary | ICD-10-CM | POA: Diagnosis present

## 2024-01-20 DIAGNOSIS — G4733 Obstructive sleep apnea (adult) (pediatric): Secondary | ICD-10-CM | POA: Insufficient documentation

## 2024-01-20 DIAGNOSIS — I63542 Cerebral infarction due to unspecified occlusion or stenosis of left cerebellar artery: Secondary | ICD-10-CM | POA: Insufficient documentation

## 2024-01-20 DIAGNOSIS — Z794 Long term (current) use of insulin: Secondary | ICD-10-CM | POA: Insufficient documentation

## 2024-01-20 MED ORDER — IOHEXOL 350 MG/ML SOLN
80.0000 mL | Freq: Once | INTRAVENOUS | Status: AC | PRN
  Administered 2024-01-20: 80 mL via INTRAVENOUS

## 2024-01-20 MED ORDER — NITROGLYCERIN 0.4 MG SL SUBL
0.8000 mg | SUBLINGUAL_TABLET | Freq: Once | SUBLINGUAL | Status: AC
  Administered 2024-01-20: 0.8 mg via SUBLINGUAL

## 2024-01-20 MED ORDER — METOPROLOL TARTRATE 5 MG/5ML IV SOLN
INTRAVENOUS | Status: AC
Start: 2024-01-20 — End: 2024-01-20
  Filled 2024-01-20: qty 5

## 2024-01-20 NOTE — Progress Notes (Signed)
 Patient arrived for cardiac CT. Took Metoprolol 50mg  pre medication. States took Cialis  Tuesday 9:30pm. Unable to proceed with cardiac CT due to contraindicated with the administration of Nitroglycerin 0.8 mg SL. Spoke with pharmacist half life of Cialis  is 36 hours need to reschedule and hold Cialis  for test. Patient left and returned stating he made a mistake and took Cialis  Monday not Tuesday. Will proceed with Cardiac CT.

## 2024-01-20 NOTE — Progress Notes (Signed)
 Patient tolerated procedure well. Ambulate w/o difficulty. Denies any lightheadedness or being dizzy. Pt denies any pain at this time. Sitting in chair. Pt is encouraged to drink additional water throughout the day and reason explained to patient. Patient verbalized understanding and all questions answered. ABC intact. No further needs at this time. Discharge from procedure area w/o issues.

## 2024-01-25 ENCOUNTER — Ambulatory Visit
Admission: RE | Admit: 2024-01-25 | Discharge: 2024-01-25 | Disposition: A | Discharge status: Home / Self Care | Source: Ambulatory Visit | Attending: Nurse Practitioner | Admitting: Nurse Practitioner

## 2024-01-25 DIAGNOSIS — I63542 Cerebral infarction due to unspecified occlusion or stenosis of left cerebellar artery: Secondary | ICD-10-CM | POA: Diagnosis present

## 2024-01-25 DIAGNOSIS — I10 Essential (primary) hypertension: Secondary | ICD-10-CM | POA: Diagnosis present

## 2024-01-25 DIAGNOSIS — G4733 Obstructive sleep apnea (adult) (pediatric): Secondary | ICD-10-CM | POA: Diagnosis present

## 2024-01-25 DIAGNOSIS — I2510 Atherosclerotic heart disease of native coronary artery without angina pectoris: Secondary | ICD-10-CM | POA: Diagnosis present

## 2024-01-25 DIAGNOSIS — R931 Abnormal findings on diagnostic imaging of heart and coronary circulation: Secondary | ICD-10-CM | POA: Insufficient documentation

## 2024-01-25 DIAGNOSIS — Z8249 Family history of ischemic heart disease and other diseases of the circulatory system: Secondary | ICD-10-CM | POA: Insufficient documentation

## 2024-01-25 DIAGNOSIS — E119 Type 2 diabetes mellitus without complications: Secondary | ICD-10-CM | POA: Diagnosis present

## 2024-01-25 DIAGNOSIS — E782 Mixed hyperlipidemia: Secondary | ICD-10-CM | POA: Diagnosis present

## 2024-01-25 MED ORDER — REGADENOSON 0.4 MG/5ML IV SOLN
0.4000 mg | Freq: Once | INTRAVENOUS | Status: AC
  Administered 2024-01-25: 0.4 mg via INTRAVENOUS
  Filled 2024-01-25: qty 5

## 2024-01-25 MED ORDER — TECHNETIUM TC 99M TETROFOSMIN IV KIT
10.6100 | PACK | Freq: Once | INTRAVENOUS | Status: AC | PRN
  Administered 2024-01-25: 10.61 via INTRAVENOUS

## 2024-01-25 MED ORDER — TECHNETIUM TC 99M TETROFOSMIN IV KIT
30.9000 | PACK | Freq: Once | INTRAVENOUS | Status: AC | PRN
  Administered 2024-01-25: 30.9 via INTRAVENOUS

## 2024-01-26 LAB — NM MYOCAR MULTI W/SPECT W/WALL MOTION / EF
Estimated workload: 1
Exercise duration (min): 1 min
Exercise duration (sec): 0 s
MPHR: 157 {beats}/min
Nuc Stress EF: 52 %
Peak HR: 99 {beats}/min
Percent HR: 63 %
Rest HR: 59 {beats}/min
Rest Nuclear Isotope Dose: 10.6 mCi
SDS: 1
SRS: 4
SSS: 1
ST Depression (mm): 0 mm
Stress Nuclear Isotope Dose: 30.9 mCi
TID: 1.05

## 2024-02-27 ENCOUNTER — Encounter: Payer: Self-pay | Admitting: Family Medicine

## 2024-02-27 DIAGNOSIS — E1169 Type 2 diabetes mellitus with other specified complication: Secondary | ICD-10-CM

## 2024-02-28 MED ORDER — OZEMPIC (0.25 OR 0.5 MG/DOSE) 2 MG/3ML ~~LOC~~ SOPN: 0.5000 mg | PEN_INJECTOR | SUBCUTANEOUS | 1 refills | Status: DC

## 2024-04-14 ENCOUNTER — Other Ambulatory Visit: Payer: Self-pay | Admitting: Family Medicine

## 2024-04-14 DIAGNOSIS — M47812 Spondylosis without myelopathy or radiculopathy, cervical region: Secondary | ICD-10-CM

## 2024-04-14 NOTE — Telephone Encounter (Signed)
 Requested Prescriptions  Pending Prescriptions Disp Refills   gabapentin  (NEURONTIN ) 300 MG capsule [Pharmacy Med Name: GABAPENTIN  300 MG CAPSULE] 180 capsule 3    Sig: TAKE 2 CAPSULES BY MOUTH AT BEDTIME.     Neurology: Anticonvulsants - gabapentin  Passed - 04/14/2024 10:39 AM      Passed - Cr in normal range and within 360 days    Creat  Date Value Ref Range Status  12/20/2023 0.87 0.70 - 1.35 mg/dL Final   Creatinine, Urine  Date Value Ref Range Status  07/21/2023 181 20 - 320 mg/dL Final         Passed - Completed PHQ-2 or PHQ-9 in the last 360 days      Passed - Valid encounter within last 12 months    Recent Outpatient Visits           3 months ago Annual physical exam   [facility] [place], Marsa PARAS, DO       Future Appointments             In 3 months Maurine, Lucie RIGGERS [facility] [place]

## 2024-04-27 ENCOUNTER — Ambulatory Visit: Admitting: Family Medicine

## 2024-05-01 ENCOUNTER — Encounter: Payer: Self-pay | Admitting: Family Medicine

## 2024-05-01 ENCOUNTER — Ambulatory Visit: Admitting: Family Medicine

## 2024-05-01 ENCOUNTER — Other Ambulatory Visit

## 2024-05-01 VITALS — BP 122/70 | HR 69 | Ht 74.0 in | Wt 235.2 lb

## 2024-05-01 DIAGNOSIS — E1169 Type 2 diabetes mellitus with other specified complication: Secondary | ICD-10-CM

## 2024-05-01 DIAGNOSIS — I6930 Unspecified sequelae of cerebral infarction: Secondary | ICD-10-CM

## 2024-05-01 DIAGNOSIS — Z01818 Encounter for other preprocedural examination: Secondary | ICD-10-CM | POA: Diagnosis not present

## 2024-05-01 DIAGNOSIS — M1612 Unilateral primary osteoarthritis, left hip: Secondary | ICD-10-CM | POA: Diagnosis not present

## 2024-05-01 DIAGNOSIS — G4733 Obstructive sleep apnea (adult) (pediatric): Secondary | ICD-10-CM

## 2024-05-01 DIAGNOSIS — M25552 Pain in left hip: Secondary | ICD-10-CM | POA: Diagnosis not present

## 2024-05-01 DIAGNOSIS — G8929 Other chronic pain: Secondary | ICD-10-CM

## 2024-05-01 DIAGNOSIS — Z7985 Long-term (current) use of injectable non-insulin antidiabetic drugs: Secondary | ICD-10-CM

## 2024-05-01 LAB — POCT GLYCOSYLATED HEMOGLOBIN (HGB A1C): Hemoglobin A1C: 5.3 % (ref 4.0–5.6)

## 2024-05-01 NOTE — Progress Notes (Addendum)
 Subjective:    Patient ID: Randy Watson, male    DOB: [birth year], 63 y.o.   MRN: [id number]  Randy Watson is a 63 y.o. male presenting on 05/01/2024 for Medical Management of Chronic Issues   HPI  PRE-OPERATIVE MEDICAL CLEARANCE Anticipated upcoming surgery - Left Total Hip Arthroplasty, by Dr Lorelle (at [facility]) in approximately 1-2 months  Patient is followed by Cardiology at [facility]. He may need additional evaluation prior to surgery.  He is here for MEDICAL clearance today.  - Today patient reports continued Left Hip Pain and ready to pursue further surgical joint replacement per Ortho  Regarding surgical and anesthesia history: History of procedures / anesthesia in past. No documented complication or allergy. He has family history of Father with anesthesia problem uncertain definition  - Able to tolerate regular exercise up to >4 METs, walking up flight of stairs - No known history of cardiovascular disease. Never had MI or known CAD. He has high risk history with fam history and prior CVA history - No known history of pulmonary disease. Never smoked. No diagnosis asthma COPD - He has history of OSA on CPAP - He has history of CVA - Denies exertional symptoms of chest pain or tightness, dyspnea, coughing, apnea, syncopal episodes, palpitations   Diabetes, Type 2 A1c significantly improved to 5.3 today, prior 6 range Meds: Metformin  500mg  TWICE A DAY, Ozempic  0.5mg  weekly Reports good compliance. Tolerating well w/o side-effects Denies hypoglycemia, polyuria, visual changes, numbness or tingling.  History of CVA, L cerebellar w/ PICA He continues on ASA 81, Lipitor 40mg  Followed by Cardiology at [facility]    CHRONIC HTN: Followed by [facility] Current Meds - Valsartan  160mg  daily Reports good compliance, took meds today. Tolerating well, w/o complaints. Denies CP, dyspnea, HA, edema, dizziness / lightheadedness   OSA, on  CPAP - Patient reports prior history of dx OSA and on CPAP - Today reports that sleep apnea is well controlled. He uses the CPAP machine every night. Tolerates the machine well, and thinks that sleeps better with it and feels good. No new concerns or symptoms.   Past Surgical History:  Procedure Laterality Date   TEE WITHOUT CARDIOVERSION N/A 05/31/2019   Procedure: TRANSESOPHAGEAL ECHOCARDIOGRAM (TEE);  Surgeon: Hester Wolm PARAS, MD;  Location: [facility] ORS;  Service: Cardiovascular;  Laterality: N/A;        05/01/2024    1:55 PM 12/28/2023    8:17 AM 07/21/2023    8:13 AM  Depression screen PHQ 2/9  Decreased Interest 0 0 0  Down, Depressed, Hopeless 0 0 0  PHQ - 2 Score 0 0 0       05/01/2024    1:55 PM 12/28/2023    8:17 AM 07/21/2023    8:13 AM 04/13/2023    8:14 AM  GAD 7 : Generalized Anxiety Score  Nervous, Anxious, on Edge 0 0 0 0  Control/stop worrying 0 0 0 0  Worry too much - different things 0 0 0 0  Trouble relaxing 0 0 0 0  Restless 0 0 0 0  Easily annoyed or irritable 0 0 0 0  Afraid - awful might happen 0 0 0 0  Total GAD 7 Score 0 0 0 0    Social History   Tobacco Use   Smoking status: Never   Smokeless tobacco: Former    Types: Chew    Quit date: 01/01/2019   Tobacco comments:    dip  Vaping Use  Vaping status: Never Used  Substance Use Topics   Alcohol use: Yes    Alcohol/week: 1.0 - 4.0 standard drink of alcohol    Types: 1 - 4 Standard drinks or equivalent per week    Comment: Only on the weekends   Drug use: Never    Review of Systems Per HPI unless specifically indicated above     Objective:    BP 122/70 (BP Location: Right Arm, Patient Position: Sitting, Cuff Size: Normal)   Pulse 69   Ht 6' 2 (1.88 m)   Wt 235 lb 4 oz (106.7 kg)   SpO2 94%   BMI 30.20 kg/m   Wt Readings from Last 3 Encounters:  05/01/24 235 lb 4 oz (106.7 kg)  01/13/24 250 lb (113.4 kg)  12/28/23 252 lb 6 oz (114.5 kg)    Physical Exam Vitals and nursing  note reviewed.  Constitutional:      General: He is not in acute distress.    Appearance: He is well-developed. He is not diaphoretic.     Comments: Well-appearing, comfortable, cooperative  HENT:     Head: Normocephalic and atraumatic.  Eyes:     General:        Right eye: No discharge.        Left eye: No discharge.     Conjunctiva/sclera: Conjunctivae normal.  Neck:     Thyroid: No thyromegaly.  Cardiovascular:     Rate and Rhythm: Normal rate and regular rhythm.     Pulses: Normal pulses.     Heart sounds: Normal heart sounds. No murmur heard. Pulmonary:     Effort: Pulmonary effort is normal. No respiratory distress.     Breath sounds: Normal breath sounds. No wheezing or rales.  Musculoskeletal:        General: Normal range of motion.     Cervical back: Normal range of motion and neck supple.  Lymphadenopathy:     Cervical: No cervical adenopathy.  Skin:    General: Skin is warm and dry.     Findings: No erythema or rash.  Neurological:     Mental Status: He is alert and oriented to person, place, and time. Mental status is at baseline.  Psychiatric:        Behavior: Behavior normal.     Comments: Well groomed, good eye contact, normal speech and thoughts     ADDENDUM REPORT: 02/04/2024 15:45   EXAM: OVER-READ INTERPRETATION  CT CHEST   The following report is an over-read performed by radiologist Dr. Andrea Gasman of [facility] on 02/04/2024. This over-read does not include interpretation of cardiac or coronary anatomy or pathology. The coronary CTA interpretation by the cardiologist is attached.   COMPARISON:  Cardiac CT 12/22/2023   FINDINGS: Vascular: Aortic atherosclerosis. The included aorta is normal in caliber.   Mediastinum/nodes: No adenopathy or mass. Unremarkable esophagus.   Lungs: No focal airspace disease. Perifissural nodules are unchanged from prior exam and consistent with benign intrapulmonary lymph nodes. These need no  further imaging follow-up. No pleural fluid. The included airways are patent.   Upper abdomen: No acute or unexpected findings.   Musculoskeletal: There are no acute or suspicious osseous abnormalities. Thoracic spondylosis.   IMPRESSION: Aortic Atherosclerosis (ICD10-I70.0).     Electronically Signed   By: Andrea Gasman M.D.   On: 02/04/2024 15:45    Addended by Gasman Andrea NOVAK, MD on 02/04/2024  3:47 PM    Study Result  Narrative & Impression  CLINICAL DATA:  Chest pain   EXAM: Cardiac/Coronary  CTA   TECHNIQUE: The patient was scanned on a Siemens Somatom scanner.   : A retrospective scan was triggered in the ascending thoracic aorta. Axial non-contrast 3 mm slices were carried out through the heart. The data set was analyzed on a dedicated work station and scored using the Agatson method. Gantry rotation speed was 66 msecs and collimation was .6 mm. 50 mg of metoprolol  and 0.8 mg of sl NTG was given. The 3D data set was reconstructed in 5% intervals of the 60-95 % of the R-R cycle. Diastolic phases were analyzed on a dedicated work station using MPR, MIP and VRT modes. The patient received 80 cc of contrast.   FINDINGS: Aorta:  Normal size.  No calcifications.  No dissection.   Aortic Valve:  Trileaflet. Mild calcifications.   Coronary Arteries:  Normal coronary origin.  Right dominance.   RCA is a dominant artery. There is calcified plaque causing mild stenosis (25-49%).   Left main gives rise to LAD and LCX arteries. LM has no disease.   LAD has calcified plaque proximally causing mild stenosis (25-49%).   LCX is a non-dominant artery. There is calcified plaque proximally causing minimal stenosis (<25%).   Other findings:   Normal pulmonary vein drainage into the left atrium.   Normal left atrial appendage without a thrombus.   Normal size of the pulmonary artery.   IMPRESSION: 1. Coronary calcium  score of 457. This was 85th percentile for  age and sex matched control.   2. Normal coronary origin with right dominance.   3. Mild stenosis in LAD and RCA (25-49%).   4. CAD-RADS 2. Mild non-obstructive CAD (25-49%). Consider non-atherosclerotic causes of chest pain. Consider preventive therapy and risk factor modification.   Electronically Signed: By: Redell Cave M.D. On: 01/20/2024 17:24    Narrative & Impression      Findings are consistent with no ischemia. The study is low risk.   No ST deviation was noted.   LV perfusion is equivocal. There is no evidence of ischemia. There is no evidence of infarction.   Left ventricular function is normal. Nuclear stress EF: 52%. The left ventricular ejection fraction is mildly decreased (45-54%). End diastolic cavity size is normal. End systolic cavity size is normal.   Conclusion Borderline /normal myocardial perfusion scan No clear evidence of reversible ischemia Preserved left ventricular function at 52% with normal wall motion There is a borderline apical defect that is not reversible and relatively small/ mild This is a low risk study   -----------------------------------------   EKG - performed in office today  Date: 05/01/24  Rate: 72  Rhythm: normal sinus rhythm  QRS Axis: left  Intervals: normal  ST/T Wave abnormalities: normal  Conduction Disutrbances:left anterior fascicular block  Additional Narrative Interpretation: baseline artifact  Old EKG Reviewed: unchanged 2020   ECG 12-lead  Component 1 yr ago  Vent Rate (bpm) 83  PR Interval (msec) 130  QRS Interval (msec) 102  QT Interval (msec) 370  QTc (msec) 434  Resulting Agency [facility]  Narrative Performed by [facility] This result has an attachment that is not available. Normal sinus rhythm Left axis deviation Abnormal ECG When compared with ECG of 01-Dec-2021 09:44, No significant change was found I reviewed and concur with this report. Electronically signed  ab:RJOOTNNI MD, DWAYNE ([id number]) on 12/13/2022 12:14:42 PM Specimen Collected: 12/11/22 09:53   Performed by: [facility] Last Resulted: 12/11/22  09:53  Received From: [facility]  Result Received: 12/14/22 07:51    Results for orders placed or performed in visit on 05/01/24  POCT HgB A1C   Collection Time: 05/01/24  2:26 PM  Result Value Ref Range   Hemoglobin A1C 5.3 4.0 - 5.6 %   HbA1c POC (<> result, manual entry)     HbA1c, POC (prediabetic range)     HbA1c, POC (controlled diabetic range)    Comprehensive metabolic panel with GFR   Collection Time: 05/02/24  7:53 AM  Result Value Ref Range   Glucose, Bld 111 (H) 65 - 99 mg/dL   BUN 12 7 - 25 mg/dL   Creat 9.21 9.29 - 8.64 mg/dL   eGFR 899 > OR = 60 fO/fpw/8.26f7   BUN/Creatinine Ratio SEE NOTE: 6 - 22 (calc)   Sodium 141 135 - 146 mmol/L   Potassium 4.1 3.5 - 5.3 mmol/L   Chloride 104 98 - 110 mmol/L   CO2 30 20 - 32 mmol/L   Calcium  9.1 8.6 - 10.3 mg/dL   Total Protein 6.1 6.1 - 8.1 g/dL   Albumin 4.0 3.6 - 5.1 g/dL   Globulin 2.1 1.9 - 3.7 g/dL (calc)   AG Ratio 1.9 1.0 - 2.5 (calc)   Total Bilirubin 0.4 0.2 - 1.2 mg/dL   Alkaline phosphatase (APISO) 87 35 - 144 U/L   AST 32 10 - 35 U/L   ALT 58 (H) 9 - 46 U/L  CBC with Differential/Platelet   Collection Time: 05/02/24  7:53 AM  Result Value Ref Range   WBC 6.2 3.8 - 10.8 Thousand/uL   RBC 3.85 (L) 4.20 - 5.80 Million/uL   Hemoglobin 12.8 (L) 13.2 - 17.1 g/dL   HCT 61.5 (L) 61.4 - 49.9 %   MCV 99.7 80.0 - 100.0 fL   MCH 33.2 (H) 27.0 - 33.0 pg   MCHC 33.3 32.0 - 36.0 g/dL   RDW 88.0 88.9 - 84.9 %   Platelets 255 140 - 400 Thousand/uL   MPV 9.5 7.5 - 12.5 fL   Neutro Abs 3,701 1,500 - 7,800 cells/uL   Absolute Lymphocytes 1,804 850 - 3,900 cells/uL   Absolute Monocytes 453 200 - 950 cells/uL   Eosinophils Absolute 192 15 - 500 cells/uL   Basophils Absolute 50 0 - 200 cells/uL   Neutrophils Relative % 59.7 %   Total Lymphocyte 29.1 %    Monocytes Relative 7.3 %   Eosinophils Relative 3.1 %   Basophils Relative 0.8 %      Assessment & Plan:   Problem List Items Addressed This Visit     Chronic left hip pain   History of cerebrovascular accident (CVA) with residual deficit   OSA (obstructive sleep apnea)   Primary osteoarthritis of left hip   Type 2 diabetes mellitus with other specified complication (HCC) - Primary   Relevant Orders   POCT HgB A1C (Completed)   Comprehensive metabolic panel with GFR (Completed)   CBC with Differential/Platelet (Completed)   Other Visit Diagnoses       Preoperative clearance       Relevant Orders   Comprehensive metabolic panel with GFR (Completed)   CBC with Differential/Platelet (Completed)   EKG 12-Lead     Long-term current use of injectable noninsulin antidiabetic medication            Pre-op clearance for non-cardiac surgery today, Left Total Hip Arthroplasty (Intermediate Risk non cardiac surgery), general anesthesia. - Previously tolerated procedure with  general anesthesia - No known cardiac hx. Appropriate functional status >4 METs - History CVA, OSA on CPAP, Diabetes controlled - Never smoker.  Plan: Medical clearance given today, pending final results on labs. Recommend to pursue Cardiac Clearance next prior to his upcoming surgery  Completed provided pre-op paperwork per Dr Lorelle [facility] office 2. Check labs today since last done 5+ months ago, CMET + CBC 3. No repeat EKG today. Last done 2024, LAFB, consistent with prior results. 4. BP controlled, OSA controlled.  He would need to hold Aspirin  81mg  daily x 7 days prior to surgery. Restart 24 hours after surgery   05/03/24 Update lab results  Mild elevated LFT ALT 58, not concerning at this time. Electrolytes and kidney function normal A1c is in range, normal CBC shows mild anemia with Hemoglobin 12.8 borderline low, from prior >13-14 I advised that this would not limit his ability to  proceed w/ his surgery as planned.  Forward results to Dr Lorelle with this record and pre op form.   Orders Placed This Encounter  Procedures   Comprehensive metabolic panel with GFR   CBC with Differential/Platelet   POCT HgB A1C   EKG 12-Lead    No orders of the defined types were placed in this encounter.   Follow up plan: Return for 6 month DM A1c.   Marsa Officer, DO [facility] 05/01/2024, 2:27 PM

## 2024-05-01 NOTE — Patient Instructions (Addendum)
 Thank you for coming to the office today.  Recent Labs    07/21/23 0829 12/20/23 0817 05/01/24 1426  HGBA1C 6.2* 6.2* 5.3   Taper off Metformin  down to 1 for a week then off  Continue Ozempic  0.5mg  weekly  Continue improving lifestyle  Recommend Diabetic Eye Exam [facility]   Address: [address] [place], [place] [address] Phone: [phone]  Website: [email or web address]  Once results come back from blood work today we will fax all to Dr Lorelle to proceed w/ scheduling.   Please schedule a Follow-up Appointment to: Return for 6 month DM A1c.  If you have any other questions or concerns, please feel free to call the office or send a message through MyChart. You may also schedule an earlier appointment if necessary.  Additionally, you may be receiving a survey about your experience at our office within a few days to 1 week by e-mail or mail. We value your feedback.  Marsa Officer, DO [facility], [place]

## 2024-05-02 LAB — COMPREHENSIVE METABOLIC PANEL WITH GFR
AG Ratio: 1.9 (calc) (ref 1.0–2.5)
ALT: 58 U/L — ABNORMAL HIGH (ref 9–46)
AST: 32 U/L (ref 10–35)
Albumin: 4 g/dL (ref 3.6–5.1)
Alkaline phosphatase (APISO): 87 U/L (ref 35–144)
BUN: 12 mg/dL (ref 7–25)
CO2: 30 mmol/L (ref 20–32)
Calcium: 9.1 mg/dL (ref 8.6–10.3)
Chloride: 104 mmol/L (ref 98–110)
Creat: 0.78 mg/dL (ref 0.70–1.35)
Globulin: 2.1 g/dL (ref 1.9–3.7)
Glucose, Bld: 111 mg/dL — ABNORMAL HIGH (ref 65–99)
Potassium: 4.1 mmol/L (ref 3.5–5.3)
Sodium: 141 mmol/L (ref 135–146)
Total Bilirubin: 0.4 mg/dL (ref 0.2–1.2)
Total Protein: 6.1 g/dL (ref 6.1–8.1)
eGFR: 100 mL/min/1.73m2 (ref >=60)

## 2024-05-02 LAB — CBC WITH DIFFERENTIAL/PLATELET
Absolute Lymphocytes: 1804 {cells}/uL (ref 850–3900)
Absolute Monocytes: 453 {cells}/uL (ref 200–950)
Basophils Absolute: 50 {cells}/uL (ref 0–200)
Basophils Relative: 0.8 %
Eosinophils Absolute: 192 {cells}/uL (ref 15–500)
Eosinophils Relative: 3.1 %
HCT: 38.4 % — ABNORMAL LOW (ref 38.5–50.0)
Hemoglobin: 12.8 g/dL — ABNORMAL LOW (ref 13.2–17.1)
MCH: 33.2 pg — ABNORMAL HIGH (ref 27.0–33.0)
MCHC: 33.3 g/dL (ref 32.0–36.0)
MCV: 99.7 fL (ref 80.0–100.0)
MPV: 9.5 fL (ref 7.5–12.5)
Monocytes Relative: 7.3 %
Neutro Abs: 3701 {cells}/uL (ref 1500–7800)
Neutrophils Relative %: 59.7 %
Platelets: 255 Thousand/uL (ref 140–400)
RBC: 3.85 Million/uL — ABNORMAL LOW (ref 4.20–5.80)
RDW: 11.9 % (ref 11.0–15.0)
Total Lymphocyte: 29.1 %
WBC: 6.2 Thousand/uL (ref 3.8–10.8)

## 2024-05-03 ENCOUNTER — Ambulatory Visit: Payer: Self-pay | Admitting: Family Medicine

## 2024-05-23 ENCOUNTER — Other Ambulatory Visit: Payer: Self-pay | Admitting: Orthopedic Surgery

## 2024-05-30 NOTE — Progress Notes (Signed)
 Division of Orthopaedic Oncology [facility]  Chief Orthopedic Oncology Complaint:  incidental SPR bony lesion  History of Present Illness: Randy Watson is a 63 y.o. year-old male who presents for further evaluation of incidentally found L SPR bone lesion. He first noticed the lesion at his recent visit with Dr. Lorelle approximately 1 month ago (9/17). He sees Dr. Lorelle for evaluation of L hip OA and candidacy for THA. He had hip/pelvic films for this visit which revealed incidental L SPR lesion. He has no symptoms related to the lesion. He has a multi-year history of L hip pain in the anterior thigh and groin, which has progressively worsened but most recently improved with intentional weight loss over the past 3-4 months. His previous pelvis films prior to 04/26/24 were from 01/25/2023 which did not demonstrate any abnormalities in the pubic bone.   He denies fevers, chills, night sweats, or unintentional weight loss. He denies pain localized to the center of the pelvis or pain outside of existing L hip pain.  The patient denies similar masses in other locations. The patient denies prior history of other injury, trauma, or surgical procedure to the area. No other personal history of cancer.  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 PMHx remarkable only for L hip OA, T2DM on ozempic , and CVA in 2024 w/o residual deficits. He has a Fhx remarkable for pancreatic and renal cancer in his father. He drinks socially and had a long history of chewing tobacco use which he quit in 2020. He denies other drug use.  Past Medical History: Past Medical History:  Diagnosis Date  . Benign essential HTN   . COVID-19 09/22/2019  . COVID-19 2022  . GERD (gastroesophageal reflux disease)    Occasionally  . History of stroke   . Hyperlipidemia 01/03/2019  . OSA (obstructive sleep apnea) 06/08/2019  . TIA  (transient ischemic attack) 2020    Past Surgical History: Past Surgical History:  Procedure Laterality Date  . DISE DYN EVAL SLEEP DISORDERED BREATHING FLX DX  N/A 09/13/2023   Procedure: Drug-induced sleep endoscopy, with dynamic evaluation of velum, pharynx, tongue base, and larynx for evaluation of sleep-disordered breathing, flexible, diagnostic;  Surgeon: Allene Ashok Carwin, MD;  Location: [facility];  Service: Otolaryngology Head and Neck;  Laterality: N/A;    Family History: Family History  Problem Relation Age of Onset  . Hyperlipidemia (Elevated cholesterol) Mother   . Anesthesia problems Father   . Coronary Artery Disease (Blocked arteries around heart) Father   . Diabetes Father   . Kidney disease Father   . No Known Problems Sister   . Myocardial Infarction (Heart attack) Maternal Grandfather   . No Known Problems Daughter   . Malignant hypertension Neg Hx   . Malignant hyperthermia Neg Hx   . Pseudochol deficiency Neg Hx   . PONV Neg Hx     Social History: He  reports that he has never smoked. He has never been exposed to tobacco smoke. He quit smokeless tobacco use about 5 years ago.  His smokeless tobacco use included snuff. He reports current alcohol use of about 4.0 standard drinks of alcohol per week. He reports that he does not use drugs.  Medications: Current Outpatient Medications  Medication Sig Dispense Refill  . acetaminophen  (TYLENOL ) 500 MG tablet Take 1,000 mg by mouth as needed for Pain    . ascorbic acid,  vitamin C, (VITAMIN C) 500 MG tablet Take 500 mg by mouth at bedtime    . aspirin  81 MG EC tablet Take 81 mg by mouth at bedtime    . atorvastatin  (LIPITOR) 80 MG tablet Take 1 tablet (80 mg total) by mouth once daily 90 tablet 3  . cholecalciferol (VITAMIN D3) 1000 unit capsule Take 1,000 Units by mouth at bedtime    . cyanocobalamin  (VITAMIN B12) 1000 MCG tablet Take 1,000 mcg by mouth at bedtime    . gabapentin   (NEURONTIN ) 300 MG capsule Take 600 mg by mouth at bedtime    . metFORMIN  (GLUCOPHAGE ) 500 MG tablet Take 500 mg by mouth every evening    . metoprolol  SUCCinate (TOPROL -XL) 25 MG XL tablet Take 0.5 tablets (12.5 mg total) by mouth once daily 45 tablet 3  . omeprazole (PRILOSEC) 20 MG DR capsule Take 20 mg by mouth once daily as needed (reflux)    . semaglutide  (OZEMPIC  SUBQ) Inject subcutaneously    . tadalafiL  (CIALIS ) 5 MG tablet Take 5 mg by mouth once daily    . valsartan  (DIOVAN ) 160 MG tablet TAKE 1 TABLET BY MOUTH EVERY DAY 90 tablet 3  . ZINC ACETATE ORAL Take 1 tablet by mouth at bedtime     No current facility-administered medications for this visit.    Allergies: Allergies  Allergen Reactions  . Penicillins Unknown    Told as a child    Review of Systems: A comprehensive 14 point ROS was performed, reviewed, and the pertinent orthopaedic findings are documented in the HPI.  Physical Exam: Vitals:   05/30/24 0858  BP: 103/66  Pulse: 62  Resp: 20  Temp: 36.3 C (97.3 F)  TempSrc: Oral  SpO2: 97%  Weight: (!) 101.5 kg (223 lb 12.3 oz)  Height: 188 cm (6' 2)  PainSc:   4  PainLoc: Hip   General/Constitutional: No apparent distress: well-nourished and well developed. Eyes: Pupils equal, round with synchronous movement. Respiratory: Non-labored breathing Vascular: No edema, swelling or tenderness, except as noted in detailed exam. Integumentary: No impressive skin lesions present, except as noted in detailed exam. Neurological:  Oriented to person, place, and time. Psychological:  Normal mood and affect. Musculoskeletal: As noted below  Focused Exam: Focused examination of the L hip and pelvis reveals no focal tenderness to palpation across the pubic bone. Reproduction of L hip pain with log roll. L hip ROM limited due to pain.  Imaging:  We reviewed XR pelvis from 04/26/2024.  Significant findings include a well-defined lytic lesion of the L SPR with thin  sclerotic margin and narrow zone of transition with expansion superiorly and potential neocortex. Sclerotic lesion of R SPR with minor superior expansion and potential popcorn calcifications.  MRI L hip from 04/26/2024. Significant findings include heterogenous T2 uptake in the L SPR lesion and minor T2 uptake in R SPR lesion. No appreciable T1 uptake in either lesion.  Assessment:  This is a 63 y.o. male with PMHx of L hip OA, CVA w/o residual deficits, and T2DM who presents for evaluation of asymptomatic and incidentally identified SPR lesion(s). 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 We discussed the benefit of a CT guided biopsy followed by controlled SPR resection if biopsy results confirm the diagnosis. He is amenable to this plan. He is currently scheduled for THA with Dr. Lorelle and we discussed delaying for 4-6 weeks or more after definitive ortho onc management with potential to proceed  with THA as early as desired if biopsy results demonstrate benign findings. He is amenable to this as well. All questions were answered and information regarding scheduling provided.  Visit Diagnosis No diagnosis found.  Plan:   - CT-guided needle biopsy - tentative plan for controlled resection of bilateral affected SPR if biopsy is confirmatory for chondrosarcoma  All questions have been answered. We have encouraged the patient to call us  if any further problems or questions should arise.   PLEASE SEE ATTENDING/RESIDENT ATTESTATION FOR CHANGES OR FINAL DETAILS  Maude BURNETTE Donalds, MS4 Sub-Intern Department of Orthopaedic Surgery

## 2024-05-31 DIAGNOSIS — R361 Hematospermia: Secondary | ICD-10-CM | POA: Insufficient documentation

## 2024-05-31 NOTE — Assessment & Plan Note (Signed)
 Hematospermia is typically a benign and self-limited condition, not requiring formal workup in the absence of specific risk factors or concerning symptoms, particularly in men younger < 64 years old. It often resolves spontaneously, but may persist or occur intermittently without clinical consequence. In older gentleman, we do consider correlation with age-based PSA screening +/- DRE. Considering his history, no further diagnostics recommended at this time. Reassurance offered.

## 2024-05-31 NOTE — Addendum Note (Signed)
 Addended by: EDMAN MARSA PARAS on: 05/31/2024 12:40 PM   Modules accepted: Orders

## 2024-05-31 NOTE — Progress Notes (Unsigned)
   06/02/2024 9:08 AM   [name] 12/21/1960 [id number]  Reason for visit: Follow up hematospermia   HPI: 63 y.o. male, initial follow up with me today, previously seen by PA Vaillancourt in May 2025  3-4 days ago had isolated hematospermia 1 day ago began having acute dysuria, gross hematuria Symptoms ongoing today Remote history of UTI Denies flank pain, symptoms similar to prior stone events, perineal pressure, N/V  Prior HPI: CVA, DM 2, Peyronie's disease, and nephrolithiasis     Physical Exam: BP 138/70   Pulse 61   Ht 6' 2 (1.88 m)   Wt 221 lb (100.2 kg)   BMI 28.37 kg/m    Constitutional:  Alert and oriented, No acute distress. GU: Normal male penoscrotal exam, morphologically normal bilateral testes, nontender, normal penile shaft and urethral meatus, no discharge  Laboratory Data: Component Ref Range & Units (hover) 5 mo ago 1 yr ago 2 yr ago 3 yr ago  PSA 0.74 0.78 CM 0.61 CM 0.66 R, CM    Pertinent Imaging: RBUS (11/26/23) -  IMPRESSION: 1. There is a 2 mm left vesico ureteric junction calculus causing mild proximal obstructive uropathy. There is an additional 1-2 mm calculus in the left kidney interpolar region. No other nephroureterolithiasis on either side. No right obstructive uropathy. 2. Multiple other nonacute observations, as described above.    Assessment & Plan:    Hematospermia Assessment & Plan: Hematospermia is typically a benign and self-limited condition, not requiring formal workup in the absence of specific risk factors or concerning symptoms, particularly in men younger < 59 years old. It often resolves spontaneously, but may persist or occur intermittently without clinical consequence. In older gentleman, we do consider correlation with age-based PSA screening +/- DRE. Considering his history, no further diagnostics recommended at this time. Reassurance offered.    - Likely related to inflammation (he may have an active  stone event, possible prostatitis) - He does not need further workup for this alone   Dysuria Assessment & Plan: +Dysuria + GH today Began in the last 48 hours UA today - 11-30 RBC, no pyuria, + crystals   - Will send for urine culture, although does not seem infectious - Hold on antibiotics until culture data - order CT stone protocol, possible acute stone event /bladder stones - Follow-up once complete   Orders: -     Urinalysis, Complete -     CULTURE, URINE COMPREHENSIVE -     CT RENAL STONE STUDY; Future  Kidney stones       Penne JONELLE Skye, MD  [facility] [address], [address] [place], [place] [address] [phone]

## 2024-06-02 ENCOUNTER — Telehealth: Payer: Self-pay

## 2024-06-02 ENCOUNTER — Ambulatory Visit: Admitting: Urology

## 2024-06-02 VITALS — BP 138/70 | HR 61 | Ht 74.0 in | Wt 221.0 lb

## 2024-06-02 DIAGNOSIS — R361 Hematospermia: Secondary | ICD-10-CM | POA: Diagnosis not present

## 2024-06-02 DIAGNOSIS — N200 Calculus of kidney: Secondary | ICD-10-CM | POA: Diagnosis not present

## 2024-06-02 DIAGNOSIS — R300 Dysuria: Secondary | ICD-10-CM | POA: Insufficient documentation

## 2024-06-02 LAB — MICROSCOPIC EXAMINATION

## 2024-06-02 LAB — URINALYSIS, COMPLETE
Bilirubin, UA: NEGATIVE
Glucose, UA: NEGATIVE
Ketones, UA: NEGATIVE
Leukocytes,UA: NEGATIVE
Nitrite, UA: NEGATIVE
Protein,UA: NEGATIVE
Specific Gravity, UA: 1.03 (ref 1.005–1.030)
Urobilinogen, Ur: 0.2 mg/dL (ref 0.2–1.0)
pH, UA: 6 (ref 5.0–7.5)

## 2024-06-02 NOTE — Assessment & Plan Note (Addendum)
+  Dysuria + GH today Began in the last 48 hours UA today - 11-30 RBC, no pyuria, + crystals   - Will send for urine culture, although does not seem infectious - Hold on antibiotics until culture data - order CT stone protocol, possible acute stone event /bladder stones - Follow-up once complete

## 2024-06-02 NOTE — Telephone Encounter (Signed)
 LVM for pt to return call,   Spoke with Dr.Garren about timeframe for CT, this should be scheduled in about 2-3 weeks, pt was informed about this before leaving clinic but wanted to verify with Dr.Garren. will wait on urine culture to prescribe antibiotic if needed.

## 2024-06-02 NOTE — Patient Instructions (Signed)
 Please contact Central Scheduling to set up your CT at [phone].

## 2024-06-06 LAB — CULTURE, URINE COMPREHENSIVE

## 2024-06-12 ENCOUNTER — Ambulatory Visit
Admission: RE | Admit: 2024-06-12 | Discharge: 2024-06-12 | Disposition: A | Discharge status: Home / Self Care | Source: Ambulatory Visit | Attending: Urology | Admitting: Urology

## 2024-06-12 DIAGNOSIS — R300 Dysuria: Secondary | ICD-10-CM | POA: Insufficient documentation

## 2024-06-14 ENCOUNTER — Other Ambulatory Visit: Payer: Self-pay

## 2024-06-16 ENCOUNTER — Telehealth: Payer: Self-pay

## 2024-06-16 NOTE — Telephone Encounter (Signed)
 Called patient to go over his results , patient had concerns about blood in urine and semen. Dr. Georganne reviewed his CT scan results which does show kidney stones on both sides however, they have been there for a while and are not worrisome. Blood in semen are most likely inflammatory and can take many weeks to resolve and can stop and start back as well. I had informed pt to call our office if he was feeling any additional pain that was stopping his normal activities , blood clots over dime size, or blood that was thick and like ketchup consistency. So he could follow up with PA Sam. Pt did not have any questions and appreciated the call of his results.-Julie-Anne Torain,CMA

## 2024-06-19 ENCOUNTER — Ambulatory Visit: Admit: 2024-06-19 | Admitting: Orthopedic Surgery

## 2024-06-19 SURGERY — ARTHROPLASTY, HIP, TOTAL, ANTERIOR APPROACH
Anesthesia: Choice | Site: Hip | Laterality: Left

## 2024-07-13 ENCOUNTER — Encounter: Payer: Self-pay | Admitting: Physician Assistant

## 2024-07-13 ENCOUNTER — Ambulatory Visit: Admitting: Physician Assistant

## 2024-07-13 VITALS — BP 112/65 | HR 70 | Ht 74.0 in | Wt 214.0 lb

## 2024-07-13 DIAGNOSIS — R361 Hematospermia: Secondary | ICD-10-CM | POA: Diagnosis not present

## 2024-07-13 NOTE — Progress Notes (Signed)
 07/13/2024 4:01 PM   Randy Watson 05/26/1961 [id number]  CC: Chief Complaint  Patient presents with   ureteral stone   HPI: Randy Watson is a 63 y.o. male with PMH CVA, DM 2, Peyronie's disease on low-dose daily tadalafil , nephrolithiasis, and a recent history of hematospermia who presents today for follow-up.   Today he reports his dysuria has resolved since he saw Dr. Georganne for hematospermia.  The hematospermia has improved and is now light pink.  He has no acute concerns today.  PSA has been stable a very low, most recently 0.74.  PMH: Past Medical History:  Diagnosis Date   Allergy    Anal fissure    Constipation    GERD (gastroesophageal reflux disease)    History of chicken pox    Obesity    Stroke (HCC) 12/2018    Surgical History: Past Surgical History:  Procedure Laterality Date   TEE WITHOUT CARDIOVERSION N/A 05/31/2019   Procedure: TRANSESOPHAGEAL ECHOCARDIOGRAM (TEE);  Surgeon: Hester Wolm PARAS, MD;  Location: [facility] ORS;  Service: Cardiovascular;  Laterality: N/A;    Home Medications:  Allergies as of 07/13/2024       Reactions   Penicillins    Did it involve swelling of the face/tongue/throat, SOB, or low BP? Unknown Did it involve sudden or severe rash/hives, skin peeling, or any reaction on the inside of your mouth or nose? Unknown Did you need to seek medical attention at a hospital or doctor's office? Unknown When did it last happen?      childhood allergy If all above answers are "NO", may proceed with cephalosporin use.        Medication List        Accurate as of July 13, 2024  4:01 PM. If you have any questions, ask your nurse or doctor.          acetaminophen  500 MG tablet Commonly known as: TYLENOL  Take 1,000 mg by mouth every 6 (six) hours as needed for moderate pain or headache.   ascorbic acid 500 MG tablet Commonly known as: VITAMIN C Take 500 mg by mouth.   aspirin  EC 81 MG tablet Take 81 mg by mouth  daily.   atorvastatin  40 MG tablet Commonly known as: LIPITOR Take 1 tablet (40 mg total) by mouth daily.   cholecalciferol 25 MCG (1000 UNIT) tablet Commonly known as: VITAMIN D3 Take 1,000 Units by mouth at bedtime.   cyanocobalamin  1000 MCG tablet Commonly known as: VITAMIN B12 Take 1,000 mcg by mouth at bedtime.   desonide  0.05 % lotion Commonly known as: DESOWEN  Apply topically 2 (two) times daily as needed (rosacea). Use for up to 3-5 days then stop, may repeat   gabapentin  300 MG capsule Commonly known as: NEURONTIN  TAKE 2 CAPSULES BY MOUTH AT BEDTIME.   ketorolac  10 MG tablet Commonly known as: TORADOL  Take 1 tablet (10 mg total) by mouth every 6 (six) hours as needed.   metFORMIN  500 MG tablet Commonly known as: GLUCOPHAGE  Take 500 mg by mouth.   omeprazole 20 MG tablet Commonly known as: PRILOSEC OTC Take 20-40 mg by mouth daily as needed (acid reflux).   ondansetron  4 MG disintegrating tablet Commonly known as: ZOFRAN -ODT Take 1 tablet (4 mg total) by mouth every 8 (eight) hours as needed for nausea or vomiting.   Ozempic  (0.25 or 0.5 MG/DOSE) 2 MG/3ML Sopn Generic drug: Semaglutide (0.25 or 0.5MG /DOS) Inject 0.5 mg into the skin once a week.   tadalafil  5 MG tablet  Commonly known as: CIALIS  Take 1 tablet (5 mg total) by mouth daily.   tamsulosin  0.4 MG Caps capsule Commonly known as: FLOMAX  Take 1 capsule (0.4 mg total) by mouth daily.   valsartan  160 MG tablet Commonly known as: DIOVAN  Take 160 mg by mouth daily.   VITAMIN C PO Take 1 tablet by mouth daily.   ZINC PO Take 1 tablet by mouth daily.        Allergies:  Allergies  Allergen Reactions   Penicillins     Did it involve swelling of the face/tongue/throat, SOB, or low BP? Unknown Did it involve sudden or severe rash/hives, skin peeling, or any reaction on the inside of your mouth or nose? Unknown Did you need to seek medical attention at a hospital or doctor's office?  Unknown When did it last happen?      childhood allergy If all above answers are "NO", may proceed with cephalosporin use.     Family History: Family History  Problem Relation Age of Onset   Cancer Father        renal/pancreatic   CAD Father    Diabetes Father    Prostate cancer Maternal Grandfather    Bladder Cancer Neg Hx     Social History:   reports that he has never smoked. He quit smokeless tobacco use about 5 years ago.  His smokeless tobacco use included chew. He reports current alcohol use of about 1.0 - 4.0 standard drink of alcohol per week. He reports that he does not use drugs.  Physical Exam: BP 112/65   Pulse 70   Ht 6' 2 (1.88 m)   Wt 214 lb (97.1 kg)   BMI 27.48 kg/m   Constitutional:  Alert and oriented, no acute distress, nontoxic appearing HEENT: [place], AT Cardiovascular: No clubbing, cyanosis, or edema Respiratory: Normal respiratory effort, no increased work of breathing Skin: No rashes, bruises or suspicious lesions Neurologic: Grossly intact, no focal deficits, moving all 4 extremities Psychiatric: Normal mood and affect  Assessment & Plan:   1. Hematospermia (Primary) Irritative symptoms have resolved.  Hematospermia has improved.  We discussed that this should resolve on its own.  If it worsens, he can let us  know and we may consider pelvic MRI.  Return in about 1 year (around 07/13/2025) for SHIM.  Lucie Hones, PA-C  [facility] [place] [address] [place], [place] [address] [phone]

## 2024-07-24 ENCOUNTER — Telehealth: Payer: Self-pay

## 2024-07-24 DIAGNOSIS — R300 Dysuria: Secondary | ICD-10-CM

## 2024-07-24 NOTE — Telephone Encounter (Signed)
(  Key: [facility]) PA Case ID #: [id number] Rx #: [id number] Prior Auth Submitted. Awaiting approval

## 2024-07-25 NOTE — Telephone Encounter (Signed)
 OZELL POUCH (Key: [facility]) PA Case ID #: [id number] Rx #: [id number]  Outcome Denied on December 15 by [employer] Your PA request has been denied. Additional information will be provided in the denial communication. (Message 1140)  Drug Tadalafil  5MG  tablets  [employer] Electronic PA Form [phone] NCPDP) Original Claim Info [id number] PRIOR AUTH REQ-MD CALL [phone].STEP THERAPY IS REQUIREDDRUG REQUIRES PRIOR AUTHORIZATION  See Media

## 2024-07-26 NOTE — Telephone Encounter (Signed)
 Not surprising, insurance tends not to cover tadalafil .  I recommend sending it into [facility] or [facility] and using a GoodRx coupon to keep the cash price as low as possible.

## 2024-07-27 NOTE — Telephone Encounter (Signed)
 Spoke with patient and informed him of PA Denial. Patient stated he was not aware of medication not being covered and has been getting it for 6 month from [facility].Patient also stated he would reach out to pharmacy to see exactly what he was paying. I advised patient he could go on GoodRx.com and retreive a coupon and present that coupon to the pharmacy for a discounted price. Patient verbalized understanding.

## 2024-08-12 ENCOUNTER — Other Ambulatory Visit: Payer: Self-pay | Admitting: Family Medicine

## 2024-08-12 DIAGNOSIS — E1169 Type 2 diabetes mellitus with other specified complication: Secondary | ICD-10-CM

## 2024-08-14 NOTE — Telephone Encounter (Signed)
 Requested Prescriptions  Pending Prescriptions Disp Refills   OZEMPIC , 0.25 OR 0.5 MG/DOSE, 2 MG/3ML SOPN [Pharmacy Med Name: OZEMPIC  0.25-0.5 MG/DOSE PEN] 9 mL 0    Sig: INJECT 0.5 MG INTO THE SKIN ONE TIME PER WEEK     Endocrinology:  Diabetes - GLP-1 Receptor Agonists - semaglutide  Passed - 08/14/2024  2:50 PM      Passed - HBA1C in normal range and within 180 days    Hemoglobin A1C  Date Value Ref Range Status  05/01/2024 5.3 4.0 - 5.6 % Final   Hgb A1c MFr Bld  Date Value Ref Range Status  12/20/2023 6.2 (H) <5.7 % Final    Comment:    For someone without known diabetes, a hemoglobin  A1c value between 5.7% and 6.4% is consistent with prediabetes and should be confirmed with a  follow-up test. . For someone with known diabetes, a value <7% indicates that their diabetes is well controlled. A1c targets should be individualized based on duration of diabetes, age, comorbid conditions, and other considerations. . This assay result is consistent with an increased risk of diabetes. . Currently, no consensus exists regarding use of hemoglobin A1c for diagnosis of diabetes for children. .          Passed - Cr in normal range and within 360 days    Creat  Date Value Ref Range Status  05/02/2024 0.78 0.70 - 1.35 mg/dL Final   Creatinine, Urine  Date Value Ref Range Status  07/21/2023 181 20 - 320 mg/dL Final         Passed - Valid encounter within last 6 months    Recent Outpatient Visits           3 months ago Type 2 diabetes mellitus with other specified complication, without long-term current use of insulin [facility])   [place] [facility] [place], Marsa PARAS, DO   7 months ago Annual physical exam   [place] [facility] [place] Marsa PARAS, DO       Future Appointments             In 11 months Vaillancourt, Samantha, PA-C [facility] [place]

## 2024-10-30 ENCOUNTER — Ambulatory Visit: Admitting: Family Medicine

## 2025-07-13 ENCOUNTER — Ambulatory Visit: Admitting: Physician Assistant
